# Patient Record
Sex: Male | Born: 1935 | Race: Black or African American | Hispanic: No | Marital: Married | Smoking: Never smoker
Health system: Southern US, Community
[De-identification: ages and names within clinical notes are randomized; demographics above are authoritative.]

## PROBLEM LIST (undated history)

## (undated) DIAGNOSIS — R51 Headache: Secondary | ICD-10-CM

## (undated) DIAGNOSIS — E785 Hyperlipidemia, unspecified: Secondary | ICD-10-CM

## (undated) DIAGNOSIS — K635 Polyp of colon: Secondary | ICD-10-CM

## (undated) DIAGNOSIS — I10 Essential (primary) hypertension: Secondary | ICD-10-CM

## (undated) DIAGNOSIS — I2510 Atherosclerotic heart disease of native coronary artery without angina pectoris: Secondary | ICD-10-CM

## (undated) DIAGNOSIS — R739 Hyperglycemia, unspecified: Secondary | ICD-10-CM

## (undated) DIAGNOSIS — E349 Endocrine disorder, unspecified: Secondary | ICD-10-CM

## (undated) DIAGNOSIS — M1990 Unspecified osteoarthritis, unspecified site: Secondary | ICD-10-CM

## (undated) DIAGNOSIS — N529 Male erectile dysfunction, unspecified: Secondary | ICD-10-CM

## (undated) DIAGNOSIS — I639 Cerebral infarction, unspecified: Secondary | ICD-10-CM

## (undated) DIAGNOSIS — I219 Acute myocardial infarction, unspecified: Secondary | ICD-10-CM

## (undated) DIAGNOSIS — E669 Obesity, unspecified: Secondary | ICD-10-CM

## (undated) DIAGNOSIS — R519 Headache, unspecified: Secondary | ICD-10-CM

## (undated) HISTORY — PX: CATARACT EXTRACTION, BILATERAL: SHX1313

## (undated) HISTORY — DX: Endocrine disorder, unspecified: E34.9

## (undated) HISTORY — DX: Obesity, unspecified: E66.9

## (undated) HISTORY — PX: SHOULDER SURGERY: SHX246

## (undated) HISTORY — DX: Essential (primary) hypertension: I10

## (undated) HISTORY — DX: Hyperglycemia, unspecified: R73.9

## (undated) HISTORY — PX: KNEE ARTHROSCOPY: SUR90

## (undated) HISTORY — DX: Hyperlipidemia, unspecified: E78.5

## (undated) HISTORY — DX: Polyp of colon: K63.5

## (undated) HISTORY — PX: TRANSURETHRAL RESECTION OF BLADDER: SUR1395

## (undated) HISTORY — DX: Atherosclerotic heart disease of native coronary artery without angina pectoris: I25.10

## (undated) HISTORY — DX: Male erectile dysfunction, unspecified: N52.9

## (undated) HISTORY — DX: Unspecified osteoarthritis, unspecified site: M19.90

---

## 1991-03-27 DIAGNOSIS — I2510 Atherosclerotic heart disease of native coronary artery without angina pectoris: Secondary | ICD-10-CM

## 1991-03-27 HISTORY — DX: Atherosclerotic heart disease of native coronary artery without angina pectoris: I25.10

## 2007-04-29 ENCOUNTER — Ambulatory Visit (HOSPITAL_COMMUNITY): Admission: RE | Admit: 2007-04-29 | Discharge: 2007-04-29 | Payer: Self-pay | Admitting: Family Medicine

## 2007-05-02 ENCOUNTER — Ambulatory Visit (HOSPITAL_COMMUNITY): Admission: RE | Admit: 2007-05-02 | Discharge: 2007-05-02 | Payer: Self-pay | Admitting: Family Medicine

## 2007-06-21 ENCOUNTER — Emergency Department (HOSPITAL_COMMUNITY): Admission: EM | Admit: 2007-06-21 | Discharge: 2007-06-21 | Payer: Self-pay | Admitting: Emergency Medicine

## 2007-07-10 ENCOUNTER — Ambulatory Visit (HOSPITAL_COMMUNITY): Admission: RE | Admit: 2007-07-10 | Discharge: 2007-07-10 | Payer: Self-pay | Admitting: Family Medicine

## 2008-06-11 ENCOUNTER — Ambulatory Visit (HOSPITAL_COMMUNITY): Admission: RE | Admit: 2008-06-11 | Discharge: 2008-06-11 | Payer: Self-pay | Admitting: Family Medicine

## 2008-10-21 ENCOUNTER — Ambulatory Visit (HOSPITAL_COMMUNITY): Admission: RE | Admit: 2008-10-21 | Discharge: 2008-10-21 | Payer: Self-pay | Admitting: Ophthalmology

## 2008-11-11 ENCOUNTER — Ambulatory Visit (HOSPITAL_COMMUNITY): Admission: RE | Admit: 2008-11-11 | Discharge: 2008-11-11 | Payer: Self-pay | Admitting: Ophthalmology

## 2008-12-26 ENCOUNTER — Emergency Department (HOSPITAL_COMMUNITY): Admission: EM | Admit: 2008-12-26 | Discharge: 2008-12-26 | Payer: Self-pay | Admitting: Emergency Medicine

## 2009-02-02 ENCOUNTER — Emergency Department (HOSPITAL_COMMUNITY): Admission: EM | Admit: 2009-02-02 | Discharge: 2009-02-02 | Payer: Self-pay | Admitting: Emergency Medicine

## 2009-03-09 ENCOUNTER — Encounter (INDEPENDENT_AMBULATORY_CARE_PROVIDER_SITE_OTHER): Payer: Self-pay | Admitting: *Deleted

## 2009-03-09 LAB — CONVERTED CEMR LAB
ALT: 21 units/L
AST: 21 units/L
Albumin: 5.2 g/dL
Alkaline Phosphatase: 63 units/L
BUN: 10 mg/dL
CO2: 25 meq/L
Calcium: 10.2 mg/dL
Chloride: 105 meq/L
Cholesterol: 138 mg/dL
Creatinine, Ser: 0.95 mg/dL
Free T4: 7.9 ng/dL
Glucose, Bld: 101 mg/dL
HDL: 45 mg/dL
LDL Cholesterol: 78 mg/dL
Potassium: 4.9 meq/L
Sodium: 142 meq/L
TSH: 1.108 microintl units/mL
Total Protein: 8.2 g/dL
Triglycerides: 76 mg/dL

## 2009-03-23 ENCOUNTER — Encounter (INDEPENDENT_AMBULATORY_CARE_PROVIDER_SITE_OTHER): Payer: Self-pay | Admitting: *Deleted

## 2009-03-23 LAB — CONVERTED CEMR LAB
ALT: 22 units/L
AST: 20 units/L
Albumin: 4.7 g/dL
Alkaline Phosphatase: 60 units/L
BUN: 17 mg/dL
CO2: 23 meq/L
Calcium: 9.1 mg/dL
Chloride: 104 meq/L
Cholesterol: 135 mg/dL
Creatinine, Ser: 1.01 mg/dL
Glucose, Bld: 93 mg/dL
HDL: 42 mg/dL
LDL Cholesterol: 74 mg/dL
Potassium: 4 meq/L
Sodium: 140 meq/L
TSH: 2.165 microintl units/mL
Total Protein: 7.5 g/dL
Triglycerides: 95 mg/dL

## 2009-03-24 ENCOUNTER — Encounter (INDEPENDENT_AMBULATORY_CARE_PROVIDER_SITE_OTHER): Payer: Self-pay | Admitting: *Deleted

## 2009-03-24 ENCOUNTER — Ambulatory Visit: Payer: Self-pay | Admitting: Cardiology

## 2009-03-24 DIAGNOSIS — I4949 Other premature depolarization: Secondary | ICD-10-CM

## 2009-03-24 DIAGNOSIS — I2510 Atherosclerotic heart disease of native coronary artery without angina pectoris: Secondary | ICD-10-CM | POA: Insufficient documentation

## 2009-03-24 DIAGNOSIS — M1990 Unspecified osteoarthritis, unspecified site: Secondary | ICD-10-CM | POA: Insufficient documentation

## 2009-03-24 DIAGNOSIS — I10 Essential (primary) hypertension: Secondary | ICD-10-CM | POA: Insufficient documentation

## 2009-03-24 DIAGNOSIS — R7309 Other abnormal glucose: Secondary | ICD-10-CM | POA: Insufficient documentation

## 2009-04-08 ENCOUNTER — Ambulatory Visit: Payer: Self-pay | Admitting: Cardiology

## 2009-04-08 ENCOUNTER — Ambulatory Visit (HOSPITAL_COMMUNITY): Admission: RE | Admit: 2009-04-08 | Discharge: 2009-04-08 | Payer: Self-pay | Admitting: Cardiology

## 2009-04-08 ENCOUNTER — Encounter: Payer: Self-pay | Admitting: Cardiology

## 2009-04-14 ENCOUNTER — Encounter (INDEPENDENT_AMBULATORY_CARE_PROVIDER_SITE_OTHER): Payer: Self-pay | Admitting: *Deleted

## 2009-05-23 ENCOUNTER — Ambulatory Visit: Payer: Self-pay | Admitting: Cardiology

## 2009-06-14 ENCOUNTER — Encounter (INDEPENDENT_AMBULATORY_CARE_PROVIDER_SITE_OTHER): Payer: Self-pay | Admitting: *Deleted

## 2009-06-14 LAB — CONVERTED CEMR LAB
ALT: 40 units/L
AST: 26 units/L
Albumin: 4.6 g/dL
Alkaline Phosphatase: 51 units/L
Bilirubin, Direct: 0.1 mg/dL
Cholesterol: 143 mg/dL
HDL: 49 mg/dL
LDL Cholesterol: 79 mg/dL
Total Protein: 7.3 g/dL
Triglycerides: 76 mg/dL

## 2009-06-24 ENCOUNTER — Telehealth (INDEPENDENT_AMBULATORY_CARE_PROVIDER_SITE_OTHER): Payer: Self-pay

## 2009-06-28 ENCOUNTER — Encounter: Payer: Self-pay | Admitting: Cardiology

## 2009-06-30 ENCOUNTER — Emergency Department (HOSPITAL_COMMUNITY): Admission: EM | Admit: 2009-06-30 | Discharge: 2009-06-30 | Payer: Self-pay | Admitting: Emergency Medicine

## 2009-07-18 ENCOUNTER — Ambulatory Visit: Payer: Self-pay | Admitting: Cardiology

## 2009-08-09 ENCOUNTER — Ambulatory Visit: Payer: Self-pay | Admitting: Cardiology

## 2010-01-09 ENCOUNTER — Encounter (INDEPENDENT_AMBULATORY_CARE_PROVIDER_SITE_OTHER): Payer: Self-pay | Admitting: *Deleted

## 2010-02-09 ENCOUNTER — Encounter (INDEPENDENT_AMBULATORY_CARE_PROVIDER_SITE_OTHER): Payer: Self-pay | Admitting: *Deleted

## 2010-02-10 ENCOUNTER — Ambulatory Visit: Payer: Self-pay | Admitting: Cardiology

## 2010-02-10 DIAGNOSIS — E785 Hyperlipidemia, unspecified: Secondary | ICD-10-CM

## 2010-03-06 ENCOUNTER — Telehealth (INDEPENDENT_AMBULATORY_CARE_PROVIDER_SITE_OTHER): Payer: Self-pay | Admitting: *Deleted

## 2010-03-08 ENCOUNTER — Ambulatory Visit: Payer: Self-pay | Admitting: Cardiovascular Disease

## 2010-03-13 ENCOUNTER — Telehealth (INDEPENDENT_AMBULATORY_CARE_PROVIDER_SITE_OTHER): Payer: Self-pay | Admitting: *Deleted

## 2010-03-13 ENCOUNTER — Ambulatory Visit: Payer: Self-pay | Admitting: Internal Medicine

## 2010-03-13 ENCOUNTER — Emergency Department (HOSPITAL_COMMUNITY)
Admission: EM | Admit: 2010-03-13 | Discharge: 2010-03-13 | Payer: Self-pay | Source: Home / Self Care | Admitting: Emergency Medicine

## 2010-03-13 DIAGNOSIS — K5900 Constipation, unspecified: Secondary | ICD-10-CM | POA: Insufficient documentation

## 2010-03-14 ENCOUNTER — Encounter: Payer: Self-pay | Admitting: Internal Medicine

## 2010-03-16 ENCOUNTER — Encounter: Payer: Self-pay | Admitting: Cardiology

## 2010-03-21 ENCOUNTER — Encounter: Payer: Self-pay | Admitting: Cardiology

## 2010-03-21 ENCOUNTER — Ambulatory Visit: Payer: Self-pay | Admitting: Cardiology

## 2010-03-28 ENCOUNTER — Telehealth (INDEPENDENT_AMBULATORY_CARE_PROVIDER_SITE_OTHER): Payer: Self-pay | Admitting: *Deleted

## 2010-04-11 ENCOUNTER — Encounter: Payer: Self-pay | Admitting: Gastroenterology

## 2010-04-12 ENCOUNTER — Ambulatory Visit (HOSPITAL_COMMUNITY)
Admission: RE | Admit: 2010-04-12 | Discharge: 2010-04-12 | Payer: Self-pay | Source: Home / Self Care | Attending: Internal Medicine | Admitting: Internal Medicine

## 2010-04-12 HISTORY — PX: COLONOSCOPY W/ POLYPECTOMY: SHX1380

## 2010-04-16 ENCOUNTER — Encounter: Payer: Self-pay | Admitting: Family Medicine

## 2010-04-21 ENCOUNTER — Encounter: Payer: Self-pay | Admitting: Internal Medicine

## 2010-04-25 ENCOUNTER — Encounter: Payer: Self-pay | Admitting: Cardiology

## 2010-04-25 NOTE — Op Note (Signed)
  NAME:  Glenn Brooks, Glenn Brooks               ACCOUNT NO.:  [id number]  MEDICAL RECORD NO.:  [id number]          PATIENT TYPE:  AMB  LOCATION:  DAY                           FACILITY:  [facility]  PHYSICIAN:  R. Roetta Sessions, M.D. DATE OF BIRTH:  11-30-1929  DATE OF PROCEDURE:  04/12/2010 DATE OF DISCHARGE:                              OPERATIVE REPORT   INDICATIONS FOR PROCEDURE:  A 77 year old gentleman with a history of colonic adenomatous polyps removed reportedly in [place], [place] 6 years ago.  He has no lower GI tract symptoms.  He is now sent over by Dr. Janna Arch for surveillance exam.  The colonoscopy is now being done. Risks, benefits, limitations, alternatives, and imponderables have been reviewed, questions answered.  Please see the documentation for the medical record.  PROCEDURE NOTE:  O2 saturation, blood pressure, pulse, and respirations were monitored throughout the entirety of the procedure.  CONSCIOUS SEDATION:  Versed 5 mg IV, Demerol 75 mg IV in divided doses.  INSTRUMENT:  Pentax video chip system.  FINDINGS:  Digital rectal exam revealed no abnormalities.  Endoscopic findings:  Prep was adequate.  Colon:  Colonic mucosa was surveyed from the rectosigmoid junction through the left transverse, right colon to the appendiceal orifice, ileocecal valve/cecum.  These structures were well seen and photographed for the record.  From this level, the scope was slowly and cautiously withdrawn.  All previously mentioned mucosal surfaces were again seen.  The patient had a 5-mm polyp at the base of the cecum which was cold snared, recovered through the scope.  At the hepatic flexure, there was a 1-cm carpet polyp, spanning 2 folds. Please see photos.  This was removed with 2 passes of hot snare and the base of polyp tissue was submitted to Pathology.  It is possible slight remnant of polyp left on the polyp which was ablated with the tip of hot snare cautery unit.  The patient  was also noted to have pancolonic diverticula, however, the remainder of the colonic mucosa appeared unremarkable.  Scope was pulled down in to the rectum where thorough examination of the rectal mucosa including retroflex view of the anal verge demonstrated no abnormalities.  The patient tolerated the procedure well.  Cecal withdrawal time 22 minutes.  IMPRESSION: 1. Normal rectum. 2. Pancolonic diverticula. 3. Polyp at hepatic flexure and cecum, resected with snare technique     as described above.  RECOMMENDATIONS: 1. Diverticulosis, polyp, and constipation literature provided to Ms.     Brouse. 2. Follow up on path. 3. For his chronic constipation, advance MiraLax 17 g orally once     daily to twice daily nightly p.r.n. no bowel movement on any given     day.     Jonathon Bellows, M.D.     RMR/MEDQ  D:  04/12/2010  T:  04/12/2010  Job:  [id number]  cc:   Melvyn Novas, MD Fax: [phone]  Electronically Signed by Lorrin Goodell M.D. on 04/25/2010 01:38:02 PM

## 2010-04-25 NOTE — Letter (Signed)
Summary: BP READINGS  BP READINGS   Imported By: Faythe Ghee 06/28/2009 14:43:51  _____________________________________________________________________  External Attachment:    Type:   Image     Comment:   External Document

## 2010-04-25 NOTE — Assessment & Plan Note (Signed)
Summary: 2 mth nurse visit per checkout on 05/23/09/tg  Nurse Visit   Vital Signs:  Patient profile:   77 year old male Height:      67 inches Weight:      188 pounds O2 Sat:      98 % on Room air Pulse rate:   56 / minute BP sitting:   147 / 85  (left arm)  Vitals Entered By: Teressa Lower RN (July 18, 2009 8:54 AM)  O2 Flow:  Room air  Visit Type:  2 month nurse visit Primary Provider:  Dr. Katharine Look   History of Present Illness: S:shortness of breath and chest pain  that radiates to left arm with exertion. Stop as soon as he stops. If the air  is too hot or cold it takes his breath also. B:  pt was seen in Feb, medication list pt brought to office did not match his office note, pt went home and brought medications to office to verify his current med list A:: this is the pt that Larita Fife tried to get and come back to the office and retrieve the bp cuff and continue his bp checks and he would not return our calls, bp diary is for 1 month only average hr:   61 average sbp:  109, removed 7 questionable sbp readings and recalc   116 average dbp:  77, recalc as above   76 diary scanned R:pt is not compliant with his bp regimen, did not bring any further readings after he was asked to continue to monitor his pressure last month   Impression & Recommendations:  Problem # 1:  HYPERTENSION (ICD-401.9) Blood pressure control appears to be good by home monitoring.  Since patient does not desire further close followup, we will continue to manage based upon office blood pressures.  Problem # 2:  ATHEROSCLEROTIC CARDIOVASCULAR DISEASE (ICD-429.2) Patient was asymptomatic at office visit 2 months ago.  He is now complaining of symptoms compatible with exertional angina.  He requires reevaluation by Ms. Lawrence or by me within the next week or 2.  Start nitroglycerin 0.4 mg SL p.r.n. if he does not have, and asked him to use them before exertion or when he developed symptoms.   Current  Medications (verified): 1)  Hydrocodone-Acetaminophen 7.5-650 Mg Tabs (Hydrocodone-Acetaminophen) .... Take 1 Tablet By Mouth Every 4 Hrs As Needed For Pain 2)  Diltiazem Hcl Er Beads 240 Mg Xr24h-Cap (Diltiazem Hcl Er Beads) .... Take 1 Tablet By Mouth Once A Day 3)  Simvastatin 40 Mg Tabs (Simvastatin) .... Take 1 Tab Daily 4)  Bayer Aspirin 325 Mg Tabs (Aspirin) .... Take 1 Tab Daily 5)  Valium 5 Mg Tabs (Diazepam) .... Take As Needed 6)  Lisinopril-Hydrochlorothiazide 20-12.5 Mg Tabs (Lisinopril-Hydrochlorothiazide) .... Take 2 Tablets By Mouth Daily 7)  Cialis 10 Mg Tabs (Tadalafil) .... As Needed Sexual Activity 8)  Diltiazem Hcl Coated Beads 180 Mg Xr24h-Cap (Diltiazem Hcl Coated Beads) .... Take 1 Tablet By Mouth Once A Day 9)  Tamsulosin Hcl 0.4 Mg Caps (Tamsulosin Hcl) .... Take 1 Tablet By Mouth Once A Day  Allergies (verified): No Known Drug Allergies  Prescriptions: NITROSTAT 0.4 MG SUBL (NITROGLYCERIN) 1 tablet under tongue at onset of chest pain; you may repeat every 5 minutes for up to 3 doses.  #25 x 3   Entered by:   Teressa Lower RN   Authorized by:   Kathlen Brunswick, MD, [facility]   Signed by:   Teressa Lower RN  on 07/21/2009   Method used:   Electronically to        [facility]. [phone]* (retail)       [address] [place]  [address]       Ph: [phone]       Fax: [phone]   RxID:   [id number]  pt notified of new rx for ntg, how to use and follow up appt on 08/04/09 11:00 am with NP  Teressa Lower RN  July 21, 2009 11:30 AM

## 2010-04-25 NOTE — Assessment & Plan Note (Signed)
Summary: 2 mth f/u per checkout on 03/24/09/tg   Visit Type:  Follow-up Primary Provider:  Dr. Katharine Look  CC:  no complaints today.  History of Present Illness: Return visit for this pleasant 77 year old gentleman with known coronary artery disease and recent palpitations.  Since his last visit, he has felt fine.  He is active including walking and bicycle riding without any cardiopulmonary symptoms.  He has been troubled by right knee pain.  A recent injection failed to provide relief.  He has not developed any new medical problems.  His has monitored blood pressure with systolics ranging from 130 to 170 and diastolics from 50-100.  Heart rate has been low at times.   Current Medications (verified): 1)  Hydrocodone-Acetaminophen 7.5-650 Mg Tabs (Hydrocodone-Acetaminophen) .... Take As Directed 2)  Ibuprofen 800 Mg Tabs (Ibuprofen) .... Take As Needed For Arthritis 3)  Diltiazem Hcl Er Beads 240 Mg Xr24h-Cap (Diltiazem Hcl Er Beads) .... Take 1 Tablet By Mouth Once A Day 4)  Simvastatin 40 Mg Tabs (Simvastatin) .... Take 1 Tab Daily 5)  Bayer Aspirin 325 Mg Tabs (Aspirin) .... Take 1 Tab Daily 6)  Valium 5 Mg Tabs (Diazepam) .... Take As Needed 7)  Metoprolol Tartrate 50 Mg Tabs (Metoprolol Tartrate) .... Take One Half  Tablet By Mouth Twice A Day 8)  Lisinopril-Hydrochlorothiazide 20-12.5 Mg Tabs (Lisinopril-Hydrochlorothiazide) .... Take 2 Tablets By Mouth Daily 9)  Cialis 10 Mg Tabs (Tadalafil)  Allergies (verified): No Known Drug Allergies  Past History:  PMH, FH, and Social History reviewed and updated.  Review of Systems  The patient denies weight loss, weight gain, chest pain, syncope, dyspnea on exertion, peripheral edema, prolonged cough, headaches, hemoptysis, abdominal pain, and melena.    Vital Signs:  Patient profile:   77 year old male Weight:      183 pounds Pulse rate:   77 / minute BP sitting:   115 / 63  (right arm)  Vitals Entered By: Dreama Saa,  CNA (May 23, 2009 1:24 PM)  Physical Exam  General:    Somewhat overweight well-developed; no acute distress: HEENT-[place]/AT; PERRL; EOM intact; conjunctiva and lids nl; bilateral arcus Neck-No JVD; no carotid bruits Lungs-No tachypnea, clear without rales, rhonchi or wheezes: CV-normal PMI; normal S1 and S2; minimal early systolic ejection murmur Abdomen-BS normal; soft and non-tender without masses or organomegaly: MS-No deformities, cyanosis or clubbing: Neurologic-Nl cranial nerves; symmetric strength and tone: Skin- Warm, no sig. lesions: Extremities-decreased left dorsalis pedis pulse; no edema    Impression & Recommendations:  Problem # 1:  ATHEROSCLEROTIC CARDIOVASCULAR DISEASE (ICD-429.2) His stress echocardiogram shows an area of scarring in the distribution of the right coronary artery without evidence for ischemia.  Overall LV systolic function is normal.  These results suggest a good prognosis and low risk for a cardiovascular event in the near or mid- term.  Since he has had some bradycardia, albeit asymptomatic, his dose of metoprolol will not be increased.  Problem # 2:  PREMATURE VENTRICULAR CONTRACTIONS (ICD-427.69) No arrhythmias noted at this visit.  Patient is not experiencing any symptoms at present.  No further testing warranted at the present time.  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  Problem # 4:  HYPERLIPIDEMIA, MIXED (ICD-272.2) Lipid profile was excellent in late 2010 with total cholesterol 135 and LDL 74.  Current medications will be continued.  Problem # 5:  HYPERTENSION (ICD-401.9) Blood pressure control is slightly suboptimal.  Mr. Fabiano dose of hydrochlorothiazide/lisinopril will be doubled.  Diltiazem will be increased to 240 mg q.d.  He will followup with the cardiovascular nurses in 2 months, while continuing to monitor blood pressure at home.  I will  see this nice gentleman again in one year.  Patient Instructions: 1)  Your physician recommends that you schedule a follow-up appointment in: 1 year 2)  Your physician has recommended you make the following change in your medication: increase lisinopril/hctz ti 2 tablets daily, increase diltiazem to 240mg  daily 3)  You have been referred to nurse visit in 2 months 4)  Your physician has requested that you regularly monitor and record your blood pressure readings at home.  Please use the same machine at the same time of day to check your readings and record them to bring to your follow-up visit. Prescriptions: CIALIS 10 MG TABS (TADALAFIL)   #3 x 0   Entered by:   Teressa Lower RN   Authorized by:   Kathlen Brunswick, MD, [facility]   Signed by:   Teressa Lower RN on 05/23/2009   Method used:   Print then Give to Patient   RxID:   [id number] DILTIAZEM HCL ER BEADS 240 MG XR24H-CAP (DILTIAZEM HCL ER BEADS) Take 1 tablet by mouth once a day  #30 x 3   Entered by:   Teressa Lower RN   Authorized by:   Kathlen Brunswick, MD, [facility]   Signed by:   Teressa Lower RN on 05/23/2009   Method used:   Electronically to        [facility]. [phone]* (retail)       [address] [place], [place]  [address]       Ph: [phone]       Fax: [phone]   RxID:   [id number] LISINOPRIL-HYDROCHLOROTHIAZIDE 20-12.5 MG TABS (LISINOPRIL-HYDROCHLOROTHIAZIDE) take 2 tablets by mouth daily  #60 x 3   Entered by:   Teressa Lower RN   Authorized by:   Kathlen Brunswick, MD, [facility]   Signed by:   Teressa Lower RN on 05/23/2009   Method used:   Electronically to        [facility]. [phone]* (retail)       [address], [place]  [address]       Ph: [phone]       Fax: [phone]   RxID:   [id number]

## 2010-04-25 NOTE — Assessment & Plan Note (Signed)
Summary: 6 mth f/u per checkout on 08/09/09/tg   Visit Type:  Follow-up Primary Shanan Mcmiller:  Dr.Richard Dondiego   History of Present Illness: [name] returns to the office as scheduled for continued assessment and treatment of coronary disease and cardiovascular risk factors, most notably hypertension.  Since his last visit, chest discomfort has resolved.  Lifestyle is relatively sedentary, but he is able to walk at a leisurely pace without difficulty.  He denies orthopnea, PND, ankle edema, lightheadedness and syncope.  He does not follow his blood pressure, but believes results were normal at his most recent visit with Dr. Janna Arch.  Records were never sent from the [facility].  Exactly which medications he is taking remains in some doubt.  We contacted his pharmacy, whose records do not include many of the medications that he has reported to [place].  He returned home brought [place] his current prescription vials for which his current medication list was constructed.   Current Medications (verified): 1)  Simvastatin 40 Mg Tabs (Simvastatin) .... Take 1 Tab Daily 2)  Bayer Aspirin 325 Mg Tabs (Aspirin) .... Take 1 Tab Daily 3)  Valium 5 Mg Tabs (Diazepam) .... Take As Needed 4)  Lisinopril-Hydrochlorothiazide 20-12.5 Mg Tabs (Lisinopril-Hydrochlorothiazide) .... Take 2 Tablets By Mouth Daily 5)  Terazosin Hcl 5 Mg Caps (Terazosin Hcl) .... Take 1 Tablet By Mouth Once A Day 6)  Nitrostat 0.4 Mg Subl (Nitroglycerin) .Marland Kitchen.. 1 Tablet Under Tongue At Onset of Chest Pain; You May Repeat Every 5 Minutes For Up To 3 Doses. 7)  Cartia Xt 180 Mg Xr24h-Cap (Diltiazem Hcl Coated Beads) .... Take 1 Tab Daily 8)  Fluoxetine Hcl 20 Mg Caps (Fluoxetine Hcl) .... Take 1 Tab Daily 9)  Metoprolol Succinate 50 Mg Xr24h-Tab (Metoprolol Succinate) .... Take One Half  Tablet By Mouth Daily  Allergies (verified): No Known Drug Allergies  Comments:  Nurse/Medical Assistant: patient didn't bring meds or list  stated that meds are correct from previous ov   Past History:  PMH, FH, and Social History reviewed and updated.  Review of Systems       See history of present illness.  Vital Signs:  Patient profile:   77 year old male Weight:      184 pounds BMI:     28.92 O2 Sat:      98 % on Room air Pulse rate:   79 / minute BP sitting:   164 / 81  (right arm)  Vitals Entered By: Dreama Saa, CNA (February 10, 2010 1:07 PM)  O2 Flow:  Room air  Physical Exam  General:  Somewhat overweight; well developed; no acute distress:   Neck-No JVD; no carotid bruits: Lungs-No tachypnea, no rales; no rhonchi; no wheezes: Cardiovascular-normal PMI; normal S1 and S2; S4 present Abdomen-BS normal; soft and non-tender without masses or organomegaly:  Musculoskeletal-No deformities, no cyanosis or clubbing: Neurologic-Normal cranial nerves; symmetric strength and tone:  Skin-Warm, no significant lesions: Extremities-1-2+ distal pulses; no edema:     Impression & Recommendations:  Problem # 1:  ATHEROSCLEROTIC CARDIOVASCULAR DISEASE (ICD-429.2) Patient is stable with respect to coronary disease with no symptoms at present to suggest myocardial ischemia and a negative stress echocardiogram within the past year.  His prognosis appears to be good, and current therapy will be continued.  Problem # 2:  HYPERLIPIDEMIA, MIXED (ICD-272.2) Lipid profile was excellent earlier this year and need not be repeated until next year.  His updated medication list for this problem includes:  Simvastatin 40 Mg Tabs (Simvastatin) .Marland Kitchen... Take 1 tab daily  CHOL: 143 (06/14/2009)   LDL: 79 (06/14/2009)   HDL: 49 (06/14/2009)   TG: 76 (06/14/2009)  Problem # 3:  HYPERTENSION (ICD-401.9) Blood pressure control has not been adequate at any previous visit to this office.  Patient has returned home to gather his medications and review them with [place].  He appears to have lost his prescription for metoprolol, which will  be resumed.  Mr. Packett will collect additional blood pressure measurements outside the office if feasible and return in one month for blood pressure assessment by the cardiology nurses.  Patient Instructions: 1)  Your physician recommends that you schedule a follow-up appointment in: 3 months 2)  Your physician has recommended you make the following change in your medication: stop tamsulosin and begin terazosin 5mg  daily, metoprolol succinate 50mg    1/2 tablet daily 3)  You have been referred to nurse visit in 1 month, please bring bp diary to nurse visit 4)  Your physician has requested that you regularly monitor and record your blood pressure readings at home.  Please use the same machine at the same time of day to check your readings and record them to bring to your follow-up visit. Prescriptions: METOPROLOL SUCCINATE 50 MG XR24H-TAB (METOPROLOL SUCCINATE) Take one half  tablet by mouth daily  #15 x 3   Entered by:   Teressa Lower RN   Authorized by:   Kathlen Brunswick, MD, [facility]   Signed by:   Teressa Lower RN on 02/10/2010   Method used:   Electronically to        [facility]. [phone]* (retail)       [address] [place], [place]  [address]       Ph: [phone]       Fax: [phone]   RxID:   [id number] TERAZOSIN HCL 5 MG CAPS (TERAZOSIN HCL) Take 1 tablet by mouth once a day  #30 x 3   Entered by:   Teressa Lower RN   Authorized by:   Kathlen Brunswick, MD, [facility]   Signed by:   Teressa Lower RN on 02/10/2010   Method used:   Electronically to        [facility]. [phone]* (retail)       [address] [place]  [address]       Ph: [phone]       Fax: [phone]   RxID:   [id number]

## 2010-04-25 NOTE — Letter (Signed)
Summary: [place] Results Engineer, agricultural at [facility]  [address] [place] [address]   Phone: [phone]  Fax: [phone]      April 14, 2009 MRN: [id number]   [facility] [address] [place], [place]  [address]   Dear Mr. Mathurin,  Your test ordered by Selena Batten has been reviewed by your physician (or physician assistant) and was found to be normal or stable. Your physician (or physician assistant) felt no changes were needed at this time.  _x___ Echocardiogram  ____ Cardiac Stress Test  ____ Lab Work  ____ Peripheral vascular study of arms, legs or neck  ____ CT scan or X-ray  ____ Lung or Breathing test  ____ Other:  No change in medical treatment at this time, per Dr. Dietrich Pates.  Thank you, Jigar Zielke Allyne Gee RN    Byron Bing, MD, [facility].C.Gaylord Shih, MD, F.A.C.C Lewayne Bunting, MD, F.A.C.C Nona Dell, MD, F.A.C.C Charlton Haws, MD, [facility].C.C

## 2010-04-25 NOTE — Miscellaneous (Signed)
Summary: LABS LIPIDS,LIVER,06/14/2009  Clinical Lists Changes  Observations: Added new observation of ALBUMIN: 4.6 g/dL (16/12/9602 54:09) Added new observation of PROTEIN, TOT: 7.3 g/dL (81/19/1478 29:56) Added new observation of SGPT (ALT): 40 units/L (06/14/2009 11:35) Added new observation of SGOT (AST): 26 units/L (06/14/2009 11:35) Added new observation of ALK PHOS: 51 units/L (06/14/2009 11:35) Added new observation of BILI DIRECT: 0.1 mg/dL (21/30/8657 84:69) Added new observation of LDL: 79 mg/dL (62/95/2841 32:44) Added new observation of HDL: 49 mg/dL (03/28/7251 66:44) Added new observation of TRIGLYC TOT: 76 mg/dL (03/47/4259 56:38) Added new observation of CHOLESTEROL: 143 mg/dL (75/64/3329 51:88)  Appended Document: LABS LIPIDS,LIVER,06/14/2009    Clinical Lists Changes  Observations: Added new observation of CARDIO HPI: Addition of a lipid profile performed dated 05/2009 by Dr. Janna Arch. (02/15/2010 17:40) Added new observation of PRIMARY MD: Dr.Richard Dondiego (02/15/2010 17:40) Added new observation of TRIGLYCERIDE: 76 mg/dL (41/66/0630 16:01) Added new observation of LDL (CALCUL): 79 mg/dL (09/32/3557 32:20)       Primary Glenn Brooks:  Dr.Richard Dondiego   History of Present Illness: Addition of a lipid profile performed dated 05/2009 by Dr. Janna Arch.   -  Date:  06/14/2009    LDL-calculated: 79    Triglycerides: 76

## 2010-04-25 NOTE — Progress Notes (Signed)
Summary: BP cuff  Phone Note Outgoing Call   Call placed by: Larita Fife Via LPN,  June 24, 2009 11:52 AM Summary of Call: Second call. Pt. brought BP cuff back to office. Pt. was to keep cuff and record readings daily until nurse visit on 07-18-09. Left message for him to come by office to get BP cuff and to continue BP monitoring.

## 2010-04-25 NOTE — Letter (Signed)
Summary: Unable to Reach, Consult Scheduled  [facility]  [address]   [place], [place] [address]   Phone: [phone]  Fax: [phone]    01/09/2010  Glenn Brooks [address] [place], [place]  [address] 1930/02/02   Dear [name],   We have been unable to reach you by phone.  Please contact our office with an updated phone number.  At the recommendation of DR DONDIEGO  we have been asked to schedule you a consult with DR Jena Gauss for CONSULT FOR COLONOSCOPY.  Please call our office at [phone].     Thank you,    Diana Eves  [facility] R. Roetta Sessions, M.D.    Jonette Eva, M.D. Lorenza Burton, FNP-BC    Tana Coast, PA-C Phone: [phone]    Fax: [phone]

## 2010-04-25 NOTE — Assessment & Plan Note (Signed)
Summary: 1-2 wk f/u per [name]   Visit Type:  Follow-up Primary Provider:  Dr. Katharine Look  CC:  no cardiology complaints today.  History of Present Illness: Glenn Brooks is herer for follow-up after medication change for history of CAD, hypertension.  When last seen his diltiazem was increased from 180mg  to 240mg .  He denies new symptoms but continues to have exertional angina, rest relieves.  He complains of a sore throat and sinus pressure today.  He continues to be active riding a bike and walking daily.  Current Medications (verified): 1)  Hydrocodone-Acetaminophen 7.5-650 Mg Tabs (Hydrocodone-Acetaminophen) .... Take 1 Tablet By Mouth Every 4 Hrs As Needed For Pain 2)  Diltiazem Hcl Er Beads 240 Mg Xr24h-Cap (Diltiazem Hcl Er Beads) .... Take 1 Tablet By Mouth Once A Day 3)  Simvastatin 40 Mg Tabs (Simvastatin) .... Take 1 Tab Daily 4)  Bayer Aspirin 325 Mg Tabs (Aspirin) .... Take 1 Tab Daily 5)  Valium 5 Mg Tabs (Diazepam) .... Take As Needed 6)  Lisinopril-Hydrochlorothiazide 20-12.5 Mg Tabs (Lisinopril-Hydrochlorothiazide) .... Take 2 Tablets By Mouth Daily 7)  Cialis 10 Mg Tabs (Tadalafil) .... As Needed Sexual Activity 8)  Tamsulosin Hcl 0.4 Mg Caps (Tamsulosin Hcl) .... Take 1 Tablet By Mouth Once A Day 9)  Nitrostat 0.4 Mg Subl (Nitroglycerin) .Marland Kitchen.. 1 Tablet Under Tongue At Onset of Chest Pain; You May Repeat Every 5 Minutes For Up To 3 Doses.  Allergies (verified): No Known Drug Allergies  Vital Signs:  Patient profile:   77 year old male Weight:      191 pounds Pulse rate:   97 / minute BP sitting:   138 / 80  (right arm)  Vitals Entered By: Dreama Saa, CNA (Aug 09, 2009 2:54 PM)  Physical Exam  General:  Well developed, well nourished, in no acute distress. Lungs:  Clear bilaterally to auscultation and percussion. Heart:  Non-displaced PMI, chest non-tender; regular rate and rhythm, S1, S2 without murmurs, rubs or gallops. Carotid upstroke normal, no  bruit. Normal abdominal aortic size, no bruits. Femorals normal pulses, no bruits. Pedals normal pulses. No edema, no varicosities. Abdomen:  Bowel sounds positive; abdomen soft and non-tender without masses, organomegaly, or hernias noted. No hepatosplenomegaly. Pulses:  pulses normal in all 4 extremities Extremities:  No clubbing or cyanosis. Psych:  anxious and agitated.     Impression & Recommendations:  Problem # 1:  ATHEROSCLEROTIC CARDIOVASCULAR DISEASE (ICD-429.2) He continues to have chest discomfort with exertion but no severe discomfort.  He says he is used to it and does allow this to stop him. He had a recent stress echo which showed an area of scarring in the distribution of the RVA without evidence of ischmia.  LV fx was normal. He unfortunately has not taken his antihypertensives for a few days, but has called his pharmacy to have them refilled.  Problem # 2:  HYPERTENSION (ICD-401.9) Unable to assess whether increased dose of diltiazem has helped him or kept his BP controlled  He is aggitated about having to see provider, and admits to not taking his medications today.  We have called his pharmacy to make sure he has refills of his cardiac medications.  He is advised to take them daily.   The following medications were removed from the medication list:    Diltiazem Hcl Coated Beads 180 Mg Xr24h-cap (Diltiazem hcl coated beads) .Marland Kitchen... Take 1 tablet by mouth once a day His updated medication list for this problem includes:  Diltiazem Hcl Er Beads 240 Mg Xr24h-cap (Diltiazem hcl er beads) .Marland Kitchen... Take 1 tablet by mouth once a day    Bayer Aspirin 325 Mg Tabs (Aspirin) .Marland Kitchen... Take 1 tab daily    Lisinopril-hydrochlorothiazide 20-12.5 Mg Tabs (Lisinopril-hydrochlorothiazide) .Marland Kitchen... Take 2 tablets by mouth daily  Patient Instructions: 1)  Your physician recommends that you schedule a follow-up appointment in: 6 months 2)  Your physician recommends that you continue on your current  medications as directed. Please refer to the Current Medication list given to you today. Prescriptions: NITROSTAT 0.4 MG SUBL (NITROGLYCERIN) 1 tablet under tongue at onset of chest pain; you may repeat every 5 minutes for up to 3 doses.  #25 x 3   Entered by:   Larita Fife Via LPN   Authorized by:   Joni Reining, NP   Signed by:   Larita Fife Via LPN on 16/12/9602   Method used:   Electronically to        [facility] [address] [phone]* (retail)       [address] [place]  [address]       Ph: [phone]       Fax: [phone]   RxID:   [id number] LISINOPRIL-HYDROCHLOROTHIAZIDE 20-12.5 MG TABS (LISINOPRIL-HYDROCHLOROTHIAZIDE) take 2 tablets by mouth daily  #60 x 5   Entered by:   Larita Fife Via LPN   Authorized by:   Joni Reining, NP   Signed by:   Larita Fife Via LPN on 53/66/4403   Method used:   Electronically to        [facility] [address] [phone]* (retail)       [address] [place], [place]  [address]       Ph: [phone]       Fax: [phone]   RxID:   [id number] SIMVASTATIN 40 MG TABS (SIMVASTATIN) take 1 tab daily  #30 x 5   Entered by:   Larita Fife Via LPN   Authorized by:   Joni Reining, NP   Signed by:   Larita Fife Via LPN on 25/42/7062   Method used:   Electronically to        [facility] [address] [phone]* (retail)       [address], [place]  [address]       Ph: [phone]       Fax: [phone]   RxID:   [id number] DILTIAZEM HCL ER BEADS 240 MG XR24H-CAP (DILTIAZEM HCL ER BEADS) Take 1 tablet by mouth once a day  #30 x 5   Entered by:   Larita Fife Via LPN   Authorized by:   Joni Reining, NP   Signed by:   Larita Fife Via LPN on 69/67/8938   Method used:   Electronically to        [facility] [address] [phone]* (retail)       [address] [place]  [address]       Ph: [phone]       Fax: [phone]   RxID:   [id number]

## 2010-04-27 NOTE — Assessment & Plan Note (Signed)
Summary: NURSE VISIT  Nurse Visit   Vital Signs:  Patient profile:   77 year old male Height:      67 inches Weight:      186 pounds O2 Sat:      97 % on Room air Temp:     97.0 degrees F oral Pulse rate:   103 / minute BP sitting:   141 / 80  (left arm)  Vitals Entered By: Teressa Lower RN (March 08, 2010 9:33 AM)  O2 Flow:  Room air  Current Medications (verified): 1)  Simvastatin 40 Mg Tabs (Simvastatin) .... Take 1 Tab Daily 2)  Bayer Aspirin 325 Mg Tabs (Aspirin) .... Take 1 Tab Daily 3)  Valium 5 Mg Tabs (Diazepam) .... Take As Needed 4)  Lisinopril-Hydrochlorothiazide 20-12.5 Mg Tabs (Lisinopril-Hydrochlorothiazide) .... Take 2 Tablets By Mouth Daily 5)  Terazosin Hcl 5 Mg Caps (Terazosin Hcl) .... Take 1 Tablet By Mouth Once A Day 6)  Nitrostat 0.4 Mg Subl (Nitroglycerin) .Marland Kitchen.. 1 Tablet Under Tongue At Onset of Chest Pain; You May Repeat Every 5 Minutes For Up To 3 Doses. 7)  Cartia Xt 180 Mg Xr24h-Cap (Diltiazem Hcl Coated Beads) .... Take 1 Tab Daily 8)  Fluoxetine Hcl 20 Mg Caps (Fluoxetine Hcl) .... Take 1 Tab Daily 9)  Metoprolol Succinate 50 Mg Xr24h-Tab (Metoprolol Succinate) .... Take One Half  Tablet By Mouth Daily  Allergies (verified): No Known Drug Allergies  Visit Type:  nurse visit Primary Provider:  Dr.Richard Dondiego   History of Present Illness: S: Pt called about his new medicaitons making him feel " out of it" B: ov 02/10/10 pt was to stop tamsulosin and begin terazosin and metoprolol A:  pt stopped his prozac instead and kept taking flomax and terazosin R: corrected medication, threw away flomax and called in prozac for pt, he will continue his bp monitoring for another month  Good plan!  Joni Reining NP [facility] for pt to schedule a nurse visit

## 2010-04-27 NOTE — Letter (Signed)
SummaryPattricia Boss [facility] RECORDS  [facility] RECORDS   Imported By: Faythe Ghee 03/16/2010 09:57:58  _____________________________________________________________________  External Attachment:    Type:   Image     Comment:   External Document

## 2010-04-27 NOTE — Progress Notes (Signed)
Summary: BP readings are low  Phone Note Call from Patient   Caller: Patient Reason for Call: Talk to Nurse Summary of Call: patient states that his BP has started running low when he checks it on his machine / would like to speak to nurse / tg Initial call taken by: Raechel Ache [facility],  March 13, 2010 3:51 PM  Follow-up for Phone Call        lmom lmom   Teressa Lower RN  March 14, 2010 8:35 AM lom   Teressa Lower RN  March 14, 2010 2:33 PM   Follow-up by: Teressa Lower RN,  March 13, 2010 4:36 PM  Additional Follow-up for Phone Call Additional follow up Details #1::        pt was seen in the ED, report printed and in your box, nurse visit scheduled for next week, to check pt's bp  Additional Follow-up by: Teressa Lower RN,  March 15, 2010 10:56 AM    Additional Follow-up for Phone Call Additional follow up Details #2::    nurse visit on 03/21/2010 Follow-up by: Teressa Lower RN,  March 16, 2010 11:37 AM

## 2010-04-27 NOTE — Letter (Signed)
Summary: OP REPORT / PATH REPORT  OP REPORT / PATH REPORT   Imported By: Rexene Alberts 04/11/2010 16:14:10  _____________________________________________________________________  External Attachment:    Type:   Image     Comment:   External Document

## 2010-04-27 NOTE — Progress Notes (Signed)
Summary: rx is making him feel out of it  Phone Note Call from Patient Call back at Home Phone [phone]   Caller: pt walk in Reason for Call: Talk to Nurse Summary of Call: pt is just started taking metoprolol 50mg  1/2 tab once a day. he states that he can not take it because it causes him to be "out of it" along with his other medications Initial call taken by: Faythe Ghee,  March 06, 2010 9:51 AM  Follow-up for Phone Call        started med 1 week ago lightheaded dizzy headaches 88/55, 66 134/80,77   Follow-up by: Teressa Lower RN,  March 06, 2010 5:35 PM  Additional Follow-up for Phone Call Additional follow up Details #1::        scheduled nurse visit for 9:00am Wednesday Additional Follow-up by: Teressa Lower RN,  March 06, 2010 5:35 PM

## 2010-04-27 NOTE — Assessment & Plan Note (Signed)
Summary: nurse visit per Tammy on 03/15/10/tg  Nurse Visit   Vital Signs:  Patient profile:   77 year old male Height:      67 inches Weight:      188 pounds O2 Sat:      99 % on Room air Pulse (ortho):   71 / minute BP standing:   147 / 79  Vitals Entered By: Teressa Lower RN (March 21, 2010 9:30 AM)  O2 Flow:  Room air  Visit Type:  nurse visit per Friday phone call Referring Jakyron Fabro:  DonDiego Primary Dallen Bunte:  Dr.Richard Dondiego   History of Present Illness: S: pt called Friday after being seen in ED B pt seen in ED on 03/13/2010, pt told to hold terazosin and cartia for 1 day  A: pt held meds until visit today, he had flomax again in his possession even though I physcially threw away his last bottle at the nurse visit on 03/08/2010.  I believe he is taking it although he states he is not. bp diary returned and scanned into record. R:  03/24/10  D/C Flomax Hold  terazosin whenever dizziness occurs it is accompanied by low blood pressure. If terazosin already taken, consume 1 cup of soup or bullion. Call office for additional problems with blood pressure-did not go to the emergency department.  Nondalton Bing, M.D.  trying to call pt, lmom Teressa Lower RN  March 24, 2010 9:54 AM left message with grandaughter for pt to call our office for med changes  Teressa Lower RN  March 28, 2010 11:18 AM Pt came to office, printed new updated med list, have instructions about boullion and hypotenison. Teressa Lower RN  March 28, 2010 12:07 PM    Serial Vital Signs/Assessments:  Time      Position  BP       Pulse  Resp  Temp     By 9:30 AM   Lying LA  136/76   68                    Tammy Sanders RN 9:30 AM   Sitting   146/86   73                    Tammy Sanders RN 9:30 AM   Standing  147/79   71                    Tammy Sanders RN  Comments: 9:30 AM no symptoms during ortho vitals By: Teressa Lower RN    Current Medications (verified): 1)  Valium 5 Mg  Tabs (Diazepam) .... Take As Needed 2)  Terazosin Hcl 5 Mg Caps (Terazosin Hcl) .... Take 1 Tablet By Mouth Once A Day 3)  Nitrostat 0.4 Mg Subl (Nitroglycerin) .Marland Kitchen.. 1 Tablet Under Tongue At Onset of Chest Pain; You May Repeat Every 5 Minutes For Up To 3 Doses. 4)  Cartia Xt 180 Mg Xr24h-Cap (Diltiazem Hcl Coated Beads) .... Take 1 Tab Daily(On Hold) 5)  Fluoxetine Hcl 20 Mg Caps (Fluoxetine Hcl) .... Take 1 Tab Daily 6)  Metoprolol Succinate 50 Mg Xr24h-Tab (Metoprolol Succinate) .... Take One Half  Tablet By Mouth Daily 7)  Aspir-Low 81 Mg Tbec (Aspirin) .... Take 1 Tablet By Mouth Once A Day 8)  Multivitamin .... Take 1 Tablet By Mouth Once A Day 9)  Colace 100 Mg Caps (Docusate Sodium) .... Take 1 By Mouth Two Times A Day 10)  Miralax  Powd (Polyethylene Glycol 3350) .... Take 1 Capful Daily As Needed For Constipation. Hold If Diarrhea  Allergies (verified): No Known Drug Allergies  Patient Instructions: 1)  Your physician recommends that you continue on your current medications as directed. Please refer to the Current Medication list given to you today.

## 2010-04-27 NOTE — Progress Notes (Signed)
  Phone Note Call from Patient   Summary of Call: pt is scheduled for tcs on 04/03/10 @ 0730 and can not be there an hour early. He wants to know if he can change time to later that day. [phone] Initial call taken by: Minna Merritts,  March 28, 2010 1:27 PM     Appended Document:  this message was taken by Darl Pikes not Angelica Chessman  Appended Document:  Pt rescheduled to 11:15am on 04/03/10.Marland KitchenMarland KitchenPt aware of appt.

## 2010-04-27 NOTE — Letter (Addendum)
Summary: Patient Notice, Colon Biopsy Results  [facility]  [address]   [place], [place] [address]   Phone: [phone]  Fax: [phone]       April 21, 2010   [name] [address] [place], [place]  [address] 09-Apr-1929    Dear Mr. Wolter,  I am pleased to inform you that the biopsies taken during your recent colonoscopy did not show any evidence of cancer upon pathologic examination.  Additional information/recommendations:  No further action is needed at this time.  Please follow-up with your primary care physician for your other healthcare needs.   You should have a repeat colonoscopy examination  in 3 years.  Please call us if you are having persistent problems or have questions about your condition that have not been fully answered at this time.  Sincerely,    R. Roetta Sessions MD, FACP [facility] Ph: [phone]    Fax: [phone]   Appended Document: Patient Notice, Colon Biopsy Results letter mailed to pt  Appended Document: Patient Notice, Colon Biopsy Results reminder in epic folder

## 2010-04-27 NOTE — Letter (Signed)
Summary: BP LOG  BP LOG   Imported By: Faythe Ghee 03/21/2010 10:38:53  _____________________________________________________________________  External Attachment:    Type:   Image     Comment:   External Document

## 2010-04-27 NOTE — Letter (Signed)
Summary: TCS ORDER  TCS ORDER   Imported By: Ave Filter 03/14/2010 09:58:58  _____________________________________________________________________  External Attachment:    Type:   Image     Comment:   External Document

## 2010-04-27 NOTE — Assessment & Plan Note (Signed)
Summary: hx of polyps,consult for tcs/ss   Visit Type:  Initial Consult Referring Provider:  DonDiego Primary Care Provider:  Dr.Richard Dondiego  CC:  Consult for TCS.  History of Present Illness: [name] presents today for a consult prior to TCS, per Dr. Otilio Saber request. He does report a colonoscopy in 2006 in [place], [place]. These reports are not available today; reportedly had polyps. Denies abdominal pain, N/V. No melena or brbpr. No loss of appetite, no change in weight. Denies reflux, no dysphagia, odynophagia. Reports constipation "whole life".  Goes every 3-4 days. hard. Uses OTC laxatives, sometimes suppositories.   Current Medications (verified): 1)  Valium 5 Mg Tabs (Diazepam) .... Take As Needed 2)  Terazosin Hcl 5 Mg Caps (Terazosin Hcl) .... Take 1 Tablet By Mouth Once A Day 3)  Nitrostat 0.4 Mg Subl (Nitroglycerin) .Marland Kitchen.. 1 Tablet Under Tongue At Onset of Chest Pain; You May Repeat Every 5 Minutes For Up To 3 Doses. 4)  Cartia Xt 180 Mg Xr24h-Cap (Diltiazem Hcl Coated Beads) .... Take 1 Tab Daily 5)  Fluoxetine Hcl 20 Mg Caps (Fluoxetine Hcl) .... Take 1 Tab Daily 6)  Metoprolol Succinate 50 Mg Xr24h-Tab (Metoprolol Succinate) .... Take One Half  Tablet By Mouth Daily 7)  Aspir-Low 81 Mg Tbec (Aspirin) .... Take 1 Tablet By Mouth Once A Day 8)  Multivitamin .... Take 1 Tablet By Mouth Once A Day 9)  Colace 100 Mg Caps (Docusate Sodium) .... Take 1 By Mouth Two Times A Day 10)  Miralax  Powd (Polyethylene Glycol 3350) .... Take 1 Capful Daily As Needed For Constipation. Hold If Diarrhea 11)  Ibuprofen 800 Mg Tabs (Ibuprofen) .... Take 1 Tablet By Mouth Three Times A Day 12)  Naprosyn 500 Mg Tabs (Naproxen) .... Take 1 Tablet By Mouth Two Times A Day 13)  Flomax 0.4 Mg Caps (Tamsulosin Hcl) .... Take 1 Tablet By Mouth Once A Day  Allergies (verified): No Known Drug Allergies  Past History:  Past Medical History: Last updated: 03/24/2009 ASCVD-status post myocardial  infarction and percutaneous intervention-1993  PVCs-no symptoms DEGENERATIVE JOINT DISEASE (ICD-715.90) ERECTILE DYSFUNCTION, SECONDARY TO MEDICATION (ICD-607.84) HYPERLIPIDEMIA, MIXED (ICD-272.2) HYPERGLYCEMIA (ICD-790.29) HYPERTENSION (ICD-401.9) Testosterone deficiency Obesity Colonic polyp excised via colonoscope  Past Surgical History: Last updated: 03/24/2009 Laparoscopic knee surgeryx2  Transurethral procedure, either diagnostic or a transurethral resection of the bladder Cataract extraction -bilateral  Family History: Last updated: 03/13/2010 Mother:unknown Father:unknown  Social History: Last updated: 03/13/2010  non-smoker, ETOH abuse 20 years ago.  nodrug abuse, married, 2 children Retired from work as a Merchandiser, retail  Family History: Mother:unknown Father:unknown  Social History:  non-smoker, ETOH abuse 20 years ago.  nodrug abuse, married, 2 children Retired from work as a Engineer, maintenance (IT):  Denies fever, chills, and anorexia. Eyes:  Denies blurring, irritation, and discharge. ENT:  Denies sore throat, hoarseness, and difficulty swallowing. CV:  Denies chest pains, angina, and syncope. Resp:  Denies dyspnea at rest and wheezing. GI:  Complains of constipation; denies difficulty swallowing, nausea, indigestion/heartburn, abdominal pain, change in bowel habits, bloody BM's, and black BMs. GU:  Denies urinary burning and urinary frequency. MS:  Denies joint pain / LOM, joint swelling, and joint stiffness. Derm:  Denies rash, itching, and dry skin. Neuro:  Denies weakness and syncope. Psych:  Denies depression and anxiety. Endo:  Denies cold intolerance and heat intolerance. Heme:  Denies bruising and bleeding.  Vital Signs:  Patient profile:   77 year old male Height:  67 inches Weight:      187 pounds BMI:     29.39 Temp:     98.0 degrees F oral Pulse rate:   88 / minute BP sitting:   108 / 60  (left arm) Cuff size:    large  Vitals Entered By: Cloria Spring LPN (March 13, 2010 2:07 PM)  Physical Exam  General:  Well developed, well nourished, no acute distress. Head:  Normocephalic and atraumatic. Eyes:  without scleral icterus Mouth:  No deformity or lesions, dentition normal. Lungs:  Clear throughout to auscultation. Heart:  Regular rate and rhythm; no murmurs, rubs,  or bruits. Abdomen:  normal bowel sounds, without guarding, without rebound, no distesion, no tenderness, no masses, and no hepatomegally or splenomegaly.  normal bowel sounds and without guarding.   Msk:  Symmetrical with no gross deformities. Normal posture. Pulses:  Normal pulses noted. Extremities:  No clubbing, cyanosis, edema or deformities noted. Neurologic:  Alert and  oriented x4;  grossly normal neurologically. Skin:  Intact without significant lesions or rashes. Psych:  Alert and cooperative. Normal mood and affect.  Impression & Recommendations:  Problem # 1:  CONSTIPATION (ICD-15.69)  77 year old African-American male with "lifelong" hx of constipation, currently has BM every 3-4 days, hard, needs assistance of OTC laxatives. no current bowel regimen. No melena, brbpr. Last colonoscopy in 2006 in [place], no records available. Due for screening colonoscopy.   Retrieve records from [place] Colace 100 mg by mouth two times a day, hold if loose stools Miralax 1 capful daily as needed constipation, hold if loose stools 6-8 glasses of water daily Fiber of choice daily Set up for TCS with RMR: the risks, benefits, and alternatives have been discussed with pt; he desires to proceed and has given verbal consent.  Orders: Consultation Level III (29562)  Problem # 2:  SCREENING COLORECTAL-CANCER (ICD-V76.51)  see #1.   Orders: Consultation Level III [phone])  Patient Instructions: 1)  Colace twice a day for constipation, hold if diarrhea 2)  Miralax as needed daily for constipation, hold if diarrhea 3)   Drink 6-8 glasses of water daily 4)  Fiber of choice over the counter 5)  The medication list was reviewed and reconciled.  All changed / newly prescribed medications were explained.  A complete medication list was provided to the patient / caregiver.  Prescriptions: MIRALAX  POWD (POLYETHYLENE GLYCOL 3350) take 1 capful daily as needed for constipation. hold if diarrhea  #1 month x 5   Entered and Authorized by:   Gerrit Halls NP   Signed by:   Gerrit Halls NP on 03/13/2010   Method used:   Faxed to ...       [facility] [address] [phone]* (retail)       [address] [place], [place]  [address]       Ph: [phone]       Fax: [phone]   RxID:   [id number] COLACE 100 MG CAPS (DOCUSATE SODIUM) take 1 by mouth two times a day  #60 x 5   Entered and Authorized by:   Gerrit Halls NP   Signed by:   Gerrit Halls NP on 03/13/2010   Method used:   Faxed to ...       [facility] [address] [phone]* (retail)       [address] [place]  [address]       Ph: [phone]  Fax: [phone]   RxID:   [id number]   Appended Document: hx of polyps,consult for tcs/ss received colonoscopy reports from April 2006 performed secondary to hematochezia, report states "post-hemorrhagic anemia" by Dr. Gaspar Skeeters: 1. Four 5 to 11mm polyps sigmoid colon, resected/retrieved 2. Diverticulosis, suspect hematochezia secondary to diverticular bleed. 3. Repeat in 5 years Path: adenomatous polyps

## 2010-05-03 NOTE — Letter (Signed)
Summary: BP LOG  BP LOG   Imported By: Faythe Ghee 04/25/2010 15:58:22  _____________________________________________________________________  External Attachment:    Type:   Image     Comment:   External Document

## 2010-05-22 ENCOUNTER — Encounter: Payer: Self-pay | Admitting: Urgent Care

## 2010-06-01 NOTE — Medication Information (Signed)
Summary: BISACODYL 5 MG  BISACODYL 5 MG   Imported By: Rexene Alberts 05/22/2010 08:28:32  _____________________________________________________________________  External Attachment:    Type:   Image     Comment:   External Document  Appended Document: BISACODYL 5 MG Is pt taking this on a regular basis? I believe this was for his colon prep.  Appended Document: BISACODYL 5 MG pharmacy informed

## 2010-07-02 LAB — BASIC METABOLIC PANEL
BUN: 9 mg/dL (ref 6–23)
CO2: 29 mEq/L (ref 19–32)
Calcium: 9.9 mg/dL (ref 8.4–10.5)
Chloride: 107 mEq/L (ref 96–112)
Creatinine, Ser: 0.98 mg/dL (ref 0.4–1.5)
GFR calc Af Amer: >60 mL/min (ref >60)
GFR calc non Af Amer: >60 mL/min (ref >60)
Glucose, Bld: 101 mg/dL — ABNORMAL HIGH (ref 70–99)
Potassium: 5 mEq/L (ref 3.5–5.1)
Sodium: 140 mEq/L (ref 135–145)

## 2010-07-02 LAB — HEMOGLOBIN AND HEMATOCRIT, BLOOD
HCT: 41.2 % (ref 39.0–52.0)
Hemoglobin: 14.4 g/dL (ref 13.0–17.0)

## 2010-10-12 ENCOUNTER — Other Ambulatory Visit: Payer: Self-pay | Admitting: Cardiology

## 2010-12-18 LAB — BASIC METABOLIC PANEL
BUN: 12
CO2: 29
Calcium: 9.4
Chloride: 103
Creatinine, Ser: 1.12
GFR calc Af Amer: >60
GFR calc non Af Amer: >60
Glucose, Bld: 101 — ABNORMAL HIGH
Potassium: 3.9
Sodium: 136

## 2010-12-18 LAB — CBC
HCT: 47.5
Hemoglobin: 16.6
MCHC: 34.9
MCV: 97.5
Platelets: 191
RBC: 4.87
RDW: 15
WBC: 4.4

## 2010-12-18 LAB — DIFFERENTIAL
Basophils Absolute: 0
Basophils Relative: 1
Eosinophils Absolute: 0.1
Eosinophils Relative: 2
Lymphocytes Relative: 46
Lymphs Abs: 2.1
Monocytes Absolute: 0.5
Monocytes Relative: 12
Neutro Abs: 1.7
Neutrophils Relative %: 38 — ABNORMAL LOW

## 2010-12-18 LAB — D-DIMER, QUANTITATIVE: D-Dimer, Quant: 0.32

## 2011-01-22 ENCOUNTER — Encounter: Payer: Self-pay | Admitting: Adult Health

## 2011-01-22 ENCOUNTER — Ambulatory Visit (INDEPENDENT_AMBULATORY_CARE_PROVIDER_SITE_OTHER): Payer: Self-pay | Admitting: Adult Health

## 2011-01-22 DIAGNOSIS — R5381 Other malaise: Secondary | ICD-10-CM

## 2011-01-22 DIAGNOSIS — E785 Hyperlipidemia, unspecified: Secondary | ICD-10-CM

## 2011-01-22 DIAGNOSIS — R5383 Other fatigue: Secondary | ICD-10-CM

## 2011-01-22 DIAGNOSIS — E782 Mixed hyperlipidemia: Secondary | ICD-10-CM

## 2011-01-22 DIAGNOSIS — I10 Essential (primary) hypertension: Secondary | ICD-10-CM

## 2011-01-22 DIAGNOSIS — I2510 Atherosclerotic heart disease of native coronary artery without angina pectoris: Secondary | ICD-10-CM

## 2011-01-22 MED ORDER — TERAZOSIN HCL 5 MG PO CAPS: 5.0000 mg | ORAL_CAPSULE | Freq: Every day | ORAL | Status: DC

## 2011-01-22 MED ORDER — METOPROLOL SUCCINATE ER 25 MG PO TB24: 25.0000 mg | ORAL_TABLET | Freq: Every day | ORAL | Status: DC

## 2011-01-22 MED ORDER — DILTIAZEM HCL ER COATED BEADS 180 MG PO CP24: 180.0000 mg | ORAL_CAPSULE | Freq: Every day | ORAL | Status: DC

## 2011-01-22 MED ORDER — NITROGLYCERIN 0.4 MG SL SUBL: 0.4000 mg | SUBLINGUAL_TABLET | SUBLINGUAL | Status: DC | PRN

## 2011-01-22 NOTE — Patient Instructions (Signed)
**Note De-Identified Wandy Bossler Obfuscation** Your physician recommends that you continue on your current medications as directed. Please refer to the Current Medication list given to you today. Please see list given to you at today's visit.  Your physician recommends that you return for lab work in: today  Your physician recommends that you schedule a follow-up appointment in: 6 months

## 2011-01-22 NOTE — Assessment & Plan Note (Signed)
Currently not well controlled at present.  He has not taken his medications today. Will follow this on medications when he takes them on next appointment, should he keep it.

## 2011-01-22 NOTE — Assessment & Plan Note (Signed)
He complains of lack of energy but is usually very sedentary.  He is also not always compliant with medications or consistent in filling them at pharmacy.  I have refilled diltiazem, metoprolol (asked him to take it at night), NTG and terazosin.  He will have labs completed to include TSH, CMET, CBC and testosterone level. Any abnormalities that can be corrected by PCP will be forwarded to them.

## 2011-01-22 NOTE — Progress Notes (Signed)
HPI: [name] is a 77 y/o patient of Dr. Dietrich Pates we are seeing for ongoing assessment and treatment of CAD and cardiovascular risk factors.  He complains of lack of energy and depression. He is not certain what medications he is on or what he needs refilled. He has a history of medical noncompliance and confusion with medications.  On last visit with Dr. Dietrich Pates he had stopped taking some of his medications and pharmacy had no records of consistent refills on others. He has not been seen by PCP, Dr. Delbert Harness consistently either.   No Known Allergies  Current Outpatient Prescriptions  Medication Sig Dispense Refill  . aspirin 81 MG tablet Take 81 mg by mouth daily.        . diazepam (VALIUM) 5 MG tablet Take 5 mg by mouth every 6 (six) hours as needed.        . diltiazem (CARDIZEM CD) 180 MG 24 hr capsule Take 1 capsule (180 mg total) by mouth daily.  30 capsule  6  . metoprolol (TOPROL-XL) 25 MG 24 hr tablet Take 1 tablet (25 mg total) by mouth daily.  30 tablet  6  . nitroGLYCERIN (NITROSTAT) 0.4 MG SL tablet Place 1 tablet (0.4 mg total) under the tongue every 5 (five) minutes as needed.  25 tablet  3  . terazosin (HYTRIN) 5 MG capsule Take 1 capsule (5 mg total) by mouth at bedtime.  30 capsule  6  . DISCONTD: diltiazem (CARDIZEM CD) 180 MG 24 hr capsule Take 180 mg by mouth daily.        Marland Kitchen DISCONTD: nitroGLYCERIN (NITROSTAT) 0.4 MG SL tablet Place 0.4 mg under the tongue every 5 (five) minutes as needed.        Marland Kitchen DISCONTD: terazosin (HYTRIN) 5 MG capsule Take 5 mg by mouth at bedtime.        Marland Kitchen DISCONTD: terazosin (HYTRIN) 5 MG capsule Take 5 mg by mouth at bedtime.          Past Medical History  Diagnosis Date  . ASCVD (arteriosclerotic cardiovascular disease) 1993    Status post MI and percutaneous intervention  . PVC (premature ventricular contraction)     No symptoms  . DJD (degenerative joint disease)   . ED (erectile dysfunction)     Secondary to medication  . Hyperlipidemia    . Hyperglycemia   . HTN (hypertension)   . Testosterone deficiency   . Obesity   . Colonic polyp     excised via colonoscope    Past Surgical History  Procedure Date  . Knee surgery     Laparoscopic x 2  . Cataract extraction, bilateral   . Transurethral resection of bladder     Either diagnostic or a    [id number] of systems complete and found to be negative unless listed above PHYSICAL EXAM BP 153/85  Pulse 51  Ht 5\' 7"  (1.702 m)  Wt 192 lb (87.091 kg)  BMI 30.07 kg/m2  SpO2 98%(Not taken medications today) General: Well developed, well nourished, in no acute distress Head: Eyes PERRLA, No xanthomas.   Normal cephalic and atramatic  Lungs: Clear bilaterally to auscultation.Marland Kitchen Heart: HRRR S1 S2, 1/6 systolic murmur.  Pulses are 2+ & equal.            No carotid bruit. No JVD.  No abdominal bruits. No femoral bruits. Abdomen: Bowel sounds are positive, abdomen soft and non-tender without masses or  Hernia's noted. Msk:  Back normal, normal gait. Normal strength and tone for age. Extremities: No clubbing, cyanosis or edema.  DP +1 Neuro: Alert and oriented X 3. Psych:  Good affect, responds appropriately  EKG:  SR with PVC's RBBB with LAFB. Rate of 100bpm  ASSESSMENT AND PLAN

## 2011-01-22 NOTE — Assessment & Plan Note (Signed)
He has not had lipids drawn recently but states his PCP is watching those levels. He does not wish to come back to have fasting labs to monitor this.

## 2011-01-23 LAB — COMPREHENSIVE METABOLIC PANEL
AST: 26 U/L (ref 0–37)
Albumin: 4.6 g/dL (ref 3.5–5.2)
Alkaline Phosphatase: 58 U/L (ref 39–117)
BUN: 14 mg/dL (ref 6–23)
Creat: 1.03 mg/dL (ref 0.50–1.35)
Potassium: 4.9 mEq/L (ref 3.5–5.3)
Total Bilirubin: 0.4 mg/dL (ref 0.3–1.2)

## 2011-01-23 LAB — CBC WITH DIFFERENTIAL/PLATELET
Eosinophils Relative: 3 % (ref 0–5)
HCT: 42.3 % (ref 39.0–52.0)
Hemoglobin: 14.2 g/dL (ref 13.0–17.0)
Lymphocytes Relative: 54 % — ABNORMAL HIGH (ref 12–46)
Lymphs Abs: 1.9 10*3/uL (ref 0.7–4.0)
MCV: 97.5 fL (ref 78.0–100.0)
Monocytes Absolute: 0.3 10*3/uL (ref 0.1–1.0)
Monocytes Relative: 10 % (ref 3–12)
Neutro Abs: 1.2 10*3/uL — ABNORMAL LOW (ref 1.7–7.7)
RBC: 4.34 MIL/uL (ref 4.22–5.81)
WBC: 3.5 10*3/uL — ABNORMAL LOW (ref 4.0–10.5)

## 2011-01-23 LAB — TESTOSTERONE: Testosterone: 211.17 ng/dL — ABNORMAL LOW (ref 250–890)

## 2011-01-23 NOTE — Progress Notes (Signed)
Addended by: Reather Laurence A on: 01/23/2011 09:45 AM   Modules accepted: Orders

## 2011-04-19 ENCOUNTER — Other Ambulatory Visit (HOSPITAL_COMMUNITY): Payer: Self-pay | Admitting: Orthopaedic Surgery

## 2011-04-19 DIAGNOSIS — M25511 Pain in right shoulder: Secondary | ICD-10-CM

## 2011-04-20 ENCOUNTER — Other Ambulatory Visit (HOSPITAL_COMMUNITY): Payer: Self-pay

## 2011-05-08 ENCOUNTER — Telehealth: Payer: Self-pay | Admitting: Cardiology

## 2011-05-08 NOTE — Telephone Encounter (Signed)
CALLING TO SEE IF HE NEEDS PRE- ANTIBIOTICS BEFORE SINGLE TOOTH EXTRACTION. FAX [phone]

## 2011-05-09 ENCOUNTER — Ambulatory Visit (HOSPITAL_COMMUNITY)
Admission: RE | Admit: 2011-05-09 | Discharge: 2011-05-09 | Disposition: A | Discharge status: Home / Self Care | Payer: PRIVATE HEALTH INSURANCE | Source: Ambulatory Visit | Attending: Orthopaedic Surgery | Admitting: Orthopaedic Surgery

## 2011-05-09 DIAGNOSIS — M25511 Pain in right shoulder: Secondary | ICD-10-CM

## 2011-05-14 ENCOUNTER — Telehealth: Payer: Self-pay | Admitting: *Deleted

## 2011-05-14 NOTE — Telephone Encounter (Signed)
Wife notified that patient does not need pre medicated wit abx prior to dental procedures per Dr Dietrich Pates.

## 2011-10-09 ENCOUNTER — Other Ambulatory Visit: Payer: Self-pay

## 2011-10-09 ENCOUNTER — Emergency Department (HOSPITAL_COMMUNITY)
Admission: EM | Admit: 2011-10-09 | Discharge: 2011-10-09 | Disposition: A | Discharge status: Home / Self Care | Payer: PRIVATE HEALTH INSURANCE | Attending: Emergency Medicine | Admitting: Emergency Medicine

## 2011-10-09 ENCOUNTER — Encounter (HOSPITAL_COMMUNITY): Payer: Self-pay | Admitting: Emergency Medicine

## 2011-10-09 ENCOUNTER — Emergency Department (HOSPITAL_COMMUNITY): Payer: PRIVATE HEALTH INSURANCE

## 2011-10-09 DIAGNOSIS — N529 Male erectile dysfunction, unspecified: Secondary | ICD-10-CM | POA: Insufficient documentation

## 2011-10-09 DIAGNOSIS — E785 Hyperlipidemia, unspecified: Secondary | ICD-10-CM | POA: Insufficient documentation

## 2011-10-09 DIAGNOSIS — Z860100 Personal history of colon polyps, unspecified: Secondary | ICD-10-CM | POA: Insufficient documentation

## 2011-10-09 DIAGNOSIS — Z9849 Cataract extraction status, unspecified eye: Secondary | ICD-10-CM | POA: Insufficient documentation

## 2011-10-09 DIAGNOSIS — W57XXXA Bitten or stung by nonvenomous insect and other nonvenomous arthropods, initial encounter: Secondary | ICD-10-CM

## 2011-10-09 DIAGNOSIS — E669 Obesity, unspecified: Secondary | ICD-10-CM | POA: Insufficient documentation

## 2011-10-09 DIAGNOSIS — M1990 Unspecified osteoarthritis, unspecified site: Secondary | ICD-10-CM | POA: Insufficient documentation

## 2011-10-09 DIAGNOSIS — S90569A Insect bite (nonvenomous), unspecified ankle, initial encounter: Secondary | ICD-10-CM | POA: Insufficient documentation

## 2011-10-09 DIAGNOSIS — I10 Essential (primary) hypertension: Secondary | ICD-10-CM | POA: Insufficient documentation

## 2011-10-09 DIAGNOSIS — Z7982 Long term (current) use of aspirin: Secondary | ICD-10-CM | POA: Insufficient documentation

## 2011-10-09 LAB — BASIC METABOLIC PANEL
BUN: 10 mg/dL (ref 6–23)
CO2: 24 mEq/L (ref 19–32)
GFR calc non Af Amer: 68 mL/min — ABNORMAL LOW (ref >90)
Glucose, Bld: 132 mg/dL — ABNORMAL HIGH (ref 70–99)
Potassium: 3.8 mEq/L (ref 3.5–5.1)

## 2011-10-09 LAB — CBC WITH DIFFERENTIAL/PLATELET
Basophils Relative: 0 % (ref 0–1)
HCT: 40.2 % (ref 39.0–52.0)
Hemoglobin: 13.6 g/dL (ref 13.0–17.0)
MCH: 32.3 pg (ref 26.0–34.0)
MCHC: 33.8 g/dL (ref 30.0–36.0)
Monocytes Absolute: 0.4 10*3/uL (ref 0.1–1.0)
Monocytes Relative: 9 % (ref 3–12)
Neutro Abs: 1.3 10*3/uL — ABNORMAL LOW (ref 1.7–7.7)

## 2011-10-09 MED ORDER — ONDANSETRON 4 MG PO TBDP
4.0000 mg | ORAL_TABLET | Freq: Once | ORAL | Status: AC
  Administered 2011-10-09: 4 mg via ORAL
  Filled 2011-10-09: qty 1

## 2011-10-09 NOTE — ED Notes (Signed)
Pt c/o insect bite/sting to left thigh at 1500 today. Pt also c/o blurry vission and nausea x 30 minutes.

## 2011-10-09 NOTE — ED Provider Notes (Signed)
History     CSN: [id number]  Arrival date & time 10/09/11  1820   First MD Initiated Contact with Patient 10/09/11 1952      Chief Complaint  Patient presents with  . Insect Bite    (Consider location/radiation/quality/duration/timing/severity/associated sxs/prior treatment) HPI Comments: The patient is an 78 year old man who says they insect stung him on his left anterior thigh around 3 PM today. He thought it could have been a bee or a spider, but he did not see the insect. After this, his face tightened up, his vision was blurry, and he had an upset stomach. This happened around 3 PM. Now at 8 PM he still feels somewhat nauseated. He denies chest pain. The pain around the insect bite comes and goes. He had no prior reaction to insect bites. His past history is noteworthy for coronary disease, hypertension and high cholesterol. He has had prior coronary artery stenting.  Patient is a 78 y.o. male presenting with general illness. The history is provided by the patient and medical records. No language interpreter was used.  Illness  The current episode started today. The onset was sudden (He got stung by an insect on the left anterior thigh around 3 PM today. This caused tightness in his face, blurred vision, and a feeling of nausea in his abdomen and a feeling of nausea and abdominal pain). Episode frequency: The feeling of nausea has persisted. Pain at the site of the insect bite comes and goes. The problem is moderate. Nothing relieves the symptoms. Nothing aggravates the symptoms. Associated symptoms include nausea. Pertinent negatives include no fever and no vomiting. Decreased vision: blurred vision. Associated symptoms comments: Tightness in his face.. Recent Medical Care: He is followed for heart disease by [facility] cardiology, and his general medical doctor is Dr. Delbert Harness.    Past Medical History  Diagnosis Date  . ASCVD (arteriosclerotic cardiovascular disease) 1993    Status post  MI and percutaneous intervention  . PVC (premature ventricular contraction)     No symptoms  . DJD (degenerative joint disease)   . ED (erectile dysfunction)     Secondary to medication  . Hyperlipidemia   . Hyperglycemia   . HTN (hypertension)   . Testosterone deficiency   . Obesity   . Colonic polyp     excised via colonoscope    Past Surgical History  Procedure Date  . Knee surgery     Laparoscopic x 2  . Cataract extraction, bilateral   . Transurethral resection of bladder     Either diagnostic or a    No family history on file.  History  Substance Use Topics  . Smoking status: Never Smoker   . Smokeless tobacco: Not on file  . Alcohol Use: Not on file      Review of Systems  Constitutional: Negative for fever and chills.  HENT:       Facial tightness.  Eyes:       Blurred vision.  Respiratory: Negative.   Cardiovascular: Negative.  Negative for chest pain.  Gastrointestinal: Positive for nausea. Negative for vomiting.  Genitourinary: Negative.   Musculoskeletal: Negative.   Skin: Negative.   Neurological: Negative.   Psychiatric/Behavioral: Negative.     Allergies  Review of patient's allergies indicates no known allergies.  Home Medications   Current Outpatient Rx  Name Route Sig Dispense Refill  . ASPIRIN 81 MG PO TABS Oral Take 81 mg by mouth daily.      Marland Kitchen DILTIAZEM HCL ER  COATED BEADS 180 MG PO CP24 Oral Take 1 capsule (180 mg total) by mouth daily. 30 capsule 6  . METOPROLOL SUCCINATE ER 25 MG PO TB24 Oral Take 1 tablet (25 mg total) by mouth daily. 30 tablet 6  . NITROGLYCERIN 0.4 MG SL SUBL Sublingual Place 1 tablet (0.4 mg total) under the tongue every 5 (five) minutes as needed. 25 tablet 3  . TERAZOSIN HCL 5 MG PO CAPS Oral Take 1 capsule (5 mg total) by mouth at bedtime. 30 capsule 6    BP 170/72  Pulse 85  Temp 98.2 F (36.8 C) (Oral)  Resp 16  Ht 5\' 7"  (1.702 m)  Wt 175 lb (79.379 kg)  BMI 27.41 kg/m2  SpO2 96%  Physical  Exam  Nursing note and vitals reviewed. Constitutional: He is oriented to person, place, and time. He appears well-developed and well-nourished. No distress.  HENT:  Head: Normocephalic and atraumatic.  Right Ear: External ear normal.  Left Ear: External ear normal.  Mouth/Throat: Oropharynx is clear and moist.  Eyes: Conjunctivae and EOM are normal. Pupils are equal, round, and reactive to light. No scleral icterus.  Neck: Normal range of motion. Neck supple.  Cardiovascular: Normal rate, regular rhythm and normal heart sounds.   Pulmonary/Chest: Effort normal and breath sounds normal.  Abdominal: Soft. Bowel sounds are normal.  Musculoskeletal: Normal range of motion.  Neurological: He is alert and oriented to person, place, and time.       No sensory or motor deficit.  Skin: Skin is warm and dry.       I could not see any bite mark on the patient's left anterior thigh, there is no rash or urticaria.  Psychiatric: He has a normal mood and affect. His behavior is normal.    ED Course  Procedures (including critical care time)  Labs Reviewed  GLUCOSE, CAPILLARY - Abnormal; Notable for the following:    Glucose-Capillary 150 (*)     All other components within normal limits  CBC WITH DIFFERENTIAL  LIPASE, BLOOD   8:04 PM Pt was seen and had physical examination.  Lab workup ordered.  ODT Zofran ordered.   Date: 10/09/2011  Rate: 78  Rhythm: normal sinus rhythm  QRS Axis: left  Intervals: normal  ST/T Wave abnormalities: normal  Conduction Disutrbances:right bundle branch block and left anterior fascicular block  Narrative Interpretation: Abnormal EKG  Old EKG Reviewed: changes noted--PVC's present on tracing of 10/13/2008 have ceased.   9:50 PM Results for orders placed during the hospital encounter of 10/09/11  GLUCOSE, CAPILLARY      Component Value Range   Glucose-Capillary 150 (*) 70 - 99 mg/dL   Comment 1 Documented in Chart    CBC WITH DIFFERENTIAL       Component Value Range   WBC 3.6 (*) 4.0 - 10.5 K/uL   RBC 4.21 (*) 4.22 - 5.81 MIL/uL   Hemoglobin 13.6  13.0 - 17.0 g/dL   HCT 56.2  13.0 - 86.5 %   MCV 95.5  78.0 - 100.0 fL   MCH 32.3  26.0 - 34.0 pg   MCHC 33.8  30.0 - 36.0 g/dL   RDW 78.4  69.6 - 29.5 %   Platelets 134 (*) 150 - 400 K/uL   Neutrophils Relative 37 (*) 43 - 77 %   Neutro Abs 1.3 (*) 1.7 - 7.7 K/uL   Lymphocytes Relative 52 (*) 12 - 46 %   Lymphs Abs 1.9  0.7 - 4.0 K/uL  Monocytes Relative 9  3 - 12 %   Monocytes Absolute 0.4  0.1 - 1.0 K/uL   Eosinophils Relative 2  0 - 5 %   Eosinophils Absolute 0.1  0.0 - 0.7 K/uL   Basophils Relative 0  0 - 1 %   Basophils Absolute 0.0  0.0 - 0.1 K/uL  LIPASE, BLOOD      Component Value Range   Lipase 14  11 - 59 U/L  BASIC METABOLIC PANEL      Component Value Range   Sodium 137  135 - 145 mEq/L   Potassium 3.8  3.5 - 5.1 mEq/L   Chloride 102  96 - 112 mEq/L   CO2 24  19 - 32 mEq/L   Glucose, Bld 132 (*) 70 - 99 mg/dL   BUN 10  6 - 23 mg/dL   Creatinine, Ser 1.61  0.50 - 1.35 mg/dL   Calcium 9.7  8.4 - 09.6 mg/dL   GFR calc non Af Amer 68 (*) >90 mL/min   GFR calc Af Amer 79 (*) >90 mL/min  TROPONIN I      Component Value Range   Troponin I <0.30  <0.30 ng/mL   Dg Abd Acute W/chest  10/09/2011  *RADIOLOGY REPORT*  Clinical Data: Nausea and abdominal discomfort.  ACUTE ABDOMEN SERIES (ABDOMEN 2 VIEW & CHEST 1 VIEW)  Comparison: 04/29/2007 chest x-ray.  Findings: No infiltrate, congestive heart failure or pneumothorax.  Calcified slightly tortuous thoracic aorta.  Heart size top normal.  Central pulmonary vascular prominence stable.  Stool throughout the colon.  No small bowel dilation.  No free intraperitoneal air.  Calcified vasculature.  Lumbar spine degenerative changes.  IMPRESSION: No plain film evidence of bowel obstruction or free intraperitoneal air.  Original Report Authenticated By: Fuller Canada, M.D.    Lab workup was negative.  Pt feels better.   Released.   1. Insect bite            Carleene Cooper III, MD 10/09/11 2154

## 2011-11-01 ENCOUNTER — Other Ambulatory Visit (HOSPITAL_COMMUNITY): Payer: Self-pay | Admitting: Orthopaedic Surgery

## 2011-11-07 NOTE — Progress Notes (Signed)
Patient was called numerous times since 11/01/11.  Mailbox of phone full.  Office called on 11/05/11 and was informed unable to get in touch with patient.  Office had no other numbers except for same phone number we had.  Patient was called again on 11/06/11 and mailbox remains full.  On 11/07/11 attempted to call patient again and mailbox remains full.  Office made aware and they have no new contact numbers.  Office was made aware on 11/05/11 and on 11/07/11 unable to get in touch with patient.  Short Stay was made aware unable to get in touch with patient.

## 2011-11-08 ENCOUNTER — Encounter (HOSPITAL_COMMUNITY): Payer: Self-pay | Admitting: Certified Registered Nurse Anesthetist

## 2011-11-08 ENCOUNTER — Encounter (HOSPITAL_COMMUNITY): Payer: Self-pay | Admitting: Orthopedic Surgery

## 2011-11-08 ENCOUNTER — Ambulatory Visit (HOSPITAL_COMMUNITY)
Admission: RE | Admit: 2011-11-08 | Discharge: 2011-11-09 | Disposition: A | Discharge status: Home / Self Care | Payer: PRIVATE HEALTH INSURANCE | Source: Ambulatory Visit | Attending: Orthopaedic Surgery | Admitting: Orthopaedic Surgery

## 2011-11-08 ENCOUNTER — Encounter (HOSPITAL_COMMUNITY): Payer: Self-pay | Admitting: *Deleted

## 2011-11-08 ENCOUNTER — Ambulatory Visit (HOSPITAL_COMMUNITY): Payer: PRIVATE HEALTH INSURANCE | Admitting: Certified Registered Nurse Anesthetist

## 2011-11-08 ENCOUNTER — Encounter (HOSPITAL_COMMUNITY)
Admission: RE | Disposition: A | Discharge status: Home / Self Care | Payer: Self-pay | Source: Ambulatory Visit | Attending: Orthopaedic Surgery

## 2011-11-08 DIAGNOSIS — I10 Essential (primary) hypertension: Secondary | ICD-10-CM | POA: Insufficient documentation

## 2011-11-08 DIAGNOSIS — M75100 Unspecified rotator cuff tear or rupture of unspecified shoulder, not specified as traumatic: Secondary | ICD-10-CM | POA: Insufficient documentation

## 2011-11-08 DIAGNOSIS — Z7982 Long term (current) use of aspirin: Secondary | ICD-10-CM | POA: Insufficient documentation

## 2011-11-08 DIAGNOSIS — M719 Bursopathy, unspecified: Secondary | ICD-10-CM | POA: Insufficient documentation

## 2011-11-08 DIAGNOSIS — E785 Hyperlipidemia, unspecified: Secondary | ICD-10-CM | POA: Insufficient documentation

## 2011-11-08 DIAGNOSIS — I252 Old myocardial infarction: Secondary | ICD-10-CM | POA: Insufficient documentation

## 2011-11-08 DIAGNOSIS — M19019 Primary osteoarthritis, unspecified shoulder: Secondary | ICD-10-CM | POA: Insufficient documentation

## 2011-11-08 DIAGNOSIS — IMO0002 Reserved for concepts with insufficient information to code with codable children: Secondary | ICD-10-CM | POA: Insufficient documentation

## 2011-11-08 DIAGNOSIS — M67919 Unspecified disorder of synovium and tendon, unspecified shoulder: Secondary | ICD-10-CM | POA: Insufficient documentation

## 2011-11-08 DIAGNOSIS — M25819 Other specified joint disorders, unspecified shoulder: Secondary | ICD-10-CM | POA: Insufficient documentation

## 2011-11-08 DIAGNOSIS — M7540 Impingement syndrome of unspecified shoulder: Secondary | ICD-10-CM

## 2011-11-08 DIAGNOSIS — Z79899 Other long term (current) drug therapy: Secondary | ICD-10-CM | POA: Insufficient documentation

## 2011-11-08 DIAGNOSIS — I2510 Atherosclerotic heart disease of native coronary artery without angina pectoris: Secondary | ICD-10-CM | POA: Insufficient documentation

## 2011-11-08 LAB — CBC
HCT: 37.9 % — ABNORMAL LOW (ref 39.0–52.0)
Hemoglobin: 13.3 g/dL (ref 13.0–17.0)
MCV: 94.3 fL (ref 78.0–100.0)
RBC: 4.02 MIL/uL — ABNORMAL LOW (ref 4.22–5.81)
WBC: 4.9 10*3/uL (ref 4.0–10.5)

## 2011-11-08 LAB — BASIC METABOLIC PANEL
CO2: 24 mEq/L (ref 19–32)
Chloride: 102 mEq/L (ref 96–112)
Potassium: 3.7 mEq/L (ref 3.5–5.1)
Sodium: 137 mEq/L (ref 135–145)

## 2011-11-08 LAB — SURGICAL PCR SCREEN: Staphylococcus aureus: POSITIVE — AB

## 2011-11-08 SURGERY — SHOULDER ARTHROSCOPY WITH SUBACROMIAL DECOMPRESSION
Anesthesia: Regional | Site: Shoulder | Laterality: Right | Wound class: Clean

## 2011-11-08 MED ORDER — EPINEPHRINE HCL 1 MG/ML IJ SOLN
INTRAMUSCULAR | Status: DC | PRN
  Administered 2011-11-08: 1 mg

## 2011-11-08 MED ORDER — ACETAMINOPHEN 10 MG/ML IV SOLN
INTRAVENOUS | Status: DC | PRN
  Administered 2011-11-08: 1000 mg via INTRAVENOUS

## 2011-11-08 MED ORDER — DIPHENHYDRAMINE HCL 12.5 MG/5ML PO ELIX: 12.5000 mg | ORAL_SOLUTION | ORAL | Status: DC | PRN

## 2011-11-08 MED ORDER — CEFAZOLIN SODIUM-DEXTROSE 2-3 GM-% IV SOLR
2.0000 g | INTRAVENOUS | Status: AC
  Administered 2011-11-08: 2 g via INTRAVENOUS

## 2011-11-08 MED ORDER — NEOSTIGMINE METHYLSULFATE 1 MG/ML IJ SOLN
INTRAMUSCULAR | Status: DC | PRN
  Administered 2011-11-08: 4 mg via INTRAVENOUS

## 2011-11-08 MED ORDER — PANTOPRAZOLE SODIUM 40 MG PO TBEC
40.0000 mg | DELAYED_RELEASE_TABLET | Freq: Every day | ORAL | Status: DC
  Administered 2011-11-09: 40 mg via ORAL
  Filled 2011-11-08 (×2): qty 1

## 2011-11-08 MED ORDER — ZOLPIDEM TARTRATE 5 MG PO TABS: 5.0000 mg | ORAL_TABLET | Freq: Every evening | ORAL | Status: DC | PRN

## 2011-11-08 MED ORDER — FENTANYL CITRATE 0.05 MG/ML IJ SOLN
INTRAMUSCULAR | Status: DC | PRN
  Administered 2011-11-08 (×4): 25 ug via INTRAVENOUS

## 2011-11-08 MED ORDER — MORPHINE SULFATE 4 MG/ML IJ SOLN
INTRAMUSCULAR | Status: DC | PRN
  Administered 2011-11-08: 4 mg

## 2011-11-08 MED ORDER — ROPIVACAINE HCL 5 MG/ML IJ SOLN
INTRAMUSCULAR | Status: AC
  Filled 2011-11-08: qty 30

## 2011-11-08 MED ORDER — TAMSULOSIN HCL 0.4 MG PO CAPS
0.4000 mg | ORAL_CAPSULE | Freq: Every day | ORAL | Status: DC
  Administered 2011-11-08 – 2011-11-09 (×2): 0.4 mg via ORAL
  Filled 2011-11-08 (×2): qty 1

## 2011-11-08 MED ORDER — LIDOCAINE HCL (CARDIAC) 20 MG/ML IV SOLN
INTRAVENOUS | Status: DC | PRN
  Administered 2011-11-08: 100 mg via INTRAVENOUS

## 2011-11-08 MED ORDER — ONDANSETRON HCL 4 MG/2ML IJ SOLN: 4.0000 mg | Freq: Four times a day (QID) | INTRAMUSCULAR | Status: DC | PRN

## 2011-11-08 MED ORDER — CEFAZOLIN SODIUM-DEXTROSE 2-3 GM-% IV SOLR
INTRAVENOUS | Status: AC
  Filled 2011-11-08: qty 50

## 2011-11-08 MED ORDER — VITAMIN B-12 1000 MCG PO TABS
1000.0000 ug | ORAL_TABLET | Freq: Every day | ORAL | Status: DC
  Administered 2011-11-09: 1000 ug via ORAL
  Filled 2011-11-08: qty 1

## 2011-11-08 MED ORDER — ASPIRIN 81 MG PO TABS: 81.0000 mg | ORAL_TABLET | Freq: Every day | ORAL | Status: DC

## 2011-11-08 MED ORDER — GLYCOPYRROLATE 0.2 MG/ML IJ SOLN
INTRAMUSCULAR | Status: DC | PRN
  Administered 2011-11-08: 0.4 mg via INTRAVENOUS

## 2011-11-08 MED ORDER — PHENYLEPHRINE HCL 10 MG/ML IJ SOLN
10.0000 mg | INTRAVENOUS | Status: DC | PRN
  Administered 2011-11-08: 50 ug/min via INTRAVENOUS

## 2011-11-08 MED ORDER — MORPHINE SULFATE 10 MG/ML IJ SOLN
2.0000 mg | INTRAMUSCULAR | Status: DC | PRN
  Administered 2011-11-09 (×2): 2 mg via INTRAVENOUS
  Filled 2011-11-08 (×2): qty 1

## 2011-11-08 MED ORDER — OXYCODONE HCL 5 MG/5ML PO SOLN
5.0000 mg | Freq: Once | ORAL | Status: DC | PRN
  Filled 2011-11-08: qty 5

## 2011-11-08 MED ORDER — DILTIAZEM HCL ER COATED BEADS 180 MG PO CP24
180.0000 mg | ORAL_CAPSULE | Freq: Every day | ORAL | Status: DC
  Administered 2011-11-08 – 2011-11-09 (×2): 180 mg via ORAL
  Filled 2011-11-08 (×2): qty 1

## 2011-11-08 MED ORDER — DUTASTERIDE 0.5 MG PO CAPS
0.5000 mg | ORAL_CAPSULE | Freq: Every day | ORAL | Status: DC
  Administered 2011-11-08 – 2011-11-09 (×2): 0.5 mg via ORAL
  Filled 2011-11-08 (×2): qty 1

## 2011-11-08 MED ORDER — ACETAMINOPHEN 10 MG/ML IV SOLN: 1000.0000 mg | Freq: Once | INTRAVENOUS | Status: DC | PRN

## 2011-11-08 MED ORDER — NITROGLYCERIN 0.4 MG SL SUBL: 0.4000 mg | SUBLINGUAL_TABLET | SUBLINGUAL | Status: DC | PRN

## 2011-11-08 MED ORDER — MUPIROCIN 2 % EX OINT
TOPICAL_OINTMENT | Freq: Two times a day (BID) | CUTANEOUS | Status: DC
  Filled 2011-11-08: qty 22

## 2011-11-08 MED ORDER — ROCURONIUM BROMIDE 100 MG/10ML IV SOLN
INTRAVENOUS | Status: DC | PRN
  Administered 2011-11-08: 25 mg via INTRAVENOUS

## 2011-11-08 MED ORDER — METOPROLOL SUCCINATE ER 25 MG PO TB24
25.0000 mg | ORAL_TABLET | Freq: Every day | ORAL | Status: DC
  Administered 2011-11-08 – 2011-11-09 (×2): 25 mg via ORAL
  Filled 2011-11-08 (×2): qty 1

## 2011-11-08 MED ORDER — METHOCARBAMOL 100 MG/ML IJ SOLN
500.0000 mg | Freq: Four times a day (QID) | INTRAVENOUS | Status: DC | PRN
  Administered 2011-11-08 – 2011-11-09 (×3): 500 mg via INTRAVENOUS
  Filled 2011-11-08 (×3): qty 5

## 2011-11-08 MED ORDER — ACETAMINOPHEN 10 MG/ML IV SOLN
INTRAVENOUS | Status: AC
  Filled 2011-11-08: qty 100

## 2011-11-08 MED ORDER — EPINEPHRINE HCL 1 MG/ML IJ SOLN
INTRAMUSCULAR | Status: AC
  Filled 2011-11-08: qty 1

## 2011-11-08 MED ORDER — METOCLOPRAMIDE HCL 5 MG/ML IJ SOLN: 5.0000 mg | Freq: Three times a day (TID) | INTRAMUSCULAR | Status: DC | PRN

## 2011-11-08 MED ORDER — TERAZOSIN HCL 5 MG PO CAPS
5.0000 mg | ORAL_CAPSULE | Freq: Every day | ORAL | Status: DC
  Administered 2011-11-08: 5 mg via ORAL
  Filled 2011-11-08 (×2): qty 1

## 2011-11-08 MED ORDER — OXYCODONE HCL 5 MG PO TABS: 5.0000 mg | ORAL_TABLET | Freq: Once | ORAL | Status: DC | PRN

## 2011-11-08 MED ORDER — METHOCARBAMOL 500 MG PO TABS
500.0000 mg | ORAL_TABLET | Freq: Four times a day (QID) | ORAL | Status: DC | PRN
  Administered 2011-11-08: 500 mg via ORAL
  Filled 2011-11-08: qty 1

## 2011-11-08 MED ORDER — ONDANSETRON HCL 4 MG/2ML IJ SOLN
INTRAMUSCULAR | Status: DC | PRN
  Administered 2011-11-08: 4 mg via INTRAVENOUS

## 2011-11-08 MED ORDER — DUTASTERIDE-TAMSULOSIN HCL 0.5-0.4 MG PO CAPS: 1.0000 | ORAL_CAPSULE | ORAL | Status: DC

## 2011-11-08 MED ORDER — MEPERIDINE HCL 50 MG/ML IJ SOLN: 6.2500 mg | INTRAMUSCULAR | Status: DC | PRN

## 2011-11-08 MED ORDER — METOPROLOL SUCCINATE ER 25 MG PO TB24
25.0000 mg | ORAL_TABLET | ORAL | Status: AC
  Administered 2011-11-08: 25 mg via ORAL
  Filled 2011-11-08: qty 1

## 2011-11-08 MED ORDER — MIDAZOLAM HCL 2 MG/2ML IJ SOLN
INTRAMUSCULAR | Status: AC
  Filled 2011-11-08: qty 2

## 2011-11-08 MED ORDER — DOCUSATE SODIUM 100 MG PO CAPS
100.0000 mg | ORAL_CAPSULE | Freq: Two times a day (BID) | ORAL | Status: DC
  Administered 2011-11-08 – 2011-11-09 (×2): 100 mg via ORAL

## 2011-11-08 MED ORDER — ONDANSETRON HCL 4 MG PO TABS: 4.0000 mg | ORAL_TABLET | Freq: Four times a day (QID) | ORAL | Status: DC | PRN

## 2011-11-08 MED ORDER — STERILE WATER FOR IRRIGATION IR SOLN
Status: DC | PRN
  Administered 2011-11-08: 500 mL

## 2011-11-08 MED ORDER — HYDROMORPHONE HCL PF 1 MG/ML IJ SOLN
INTRAMUSCULAR | Status: AC
  Filled 2011-11-08: qty 1

## 2011-11-08 MED ORDER — ASPIRIN EC 81 MG PO TBEC
81.0000 mg | DELAYED_RELEASE_TABLET | Freq: Every day | ORAL | Status: DC
  Administered 2011-11-09: 81 mg via ORAL
  Filled 2011-11-08: qty 1

## 2011-11-08 MED ORDER — VITAMIN E 180 MG (400 UNIT) PO CAPS
400.0000 [IU] | ORAL_CAPSULE | Freq: Every day | ORAL | Status: DC
  Administered 2011-11-09: 400 [IU] via ORAL
  Filled 2011-11-08: qty 1

## 2011-11-08 MED ORDER — KETOROLAC TROMETHAMINE 15 MG/ML IJ SOLN
7.5000 mg | Freq: Four times a day (QID) | INTRAMUSCULAR | Status: DC
  Administered 2011-11-08 – 2011-11-09 (×6): 7.5 mg via INTRAVENOUS
  Filled 2011-11-08 (×9): qty 1

## 2011-11-08 MED ORDER — SUCCINYLCHOLINE CHLORIDE 20 MG/ML IJ SOLN
INTRAMUSCULAR | Status: DC | PRN
  Administered 2011-11-08: 100 mg via INTRAVENOUS

## 2011-11-08 MED ORDER — CEFAZOLIN SODIUM 1-5 GM-% IV SOLN
1.0000 g | Freq: Four times a day (QID) | INTRAVENOUS | Status: AC
  Administered 2011-11-08 – 2011-11-09 (×3): 1 g via INTRAVENOUS
  Filled 2011-11-08 (×3): qty 50

## 2011-11-08 MED ORDER — MORPHINE SULFATE 4 MG/ML IJ SOLN
INTRAMUSCULAR | Status: AC
  Filled 2011-11-08: qty 1

## 2011-11-08 MED ORDER — BUPIVACAINE HCL (PF) 0.25 % IJ SOLN
INTRAMUSCULAR | Status: AC
  Filled 2011-11-08: qty 30

## 2011-11-08 MED ORDER — LACTATED RINGERS IV SOLN
INTRAVENOUS | Status: DC | PRN
  Administered 2011-11-08: 10:00:00 via INTRAVENOUS

## 2011-11-08 MED ORDER — HYDROMORPHONE HCL PF 1 MG/ML IJ SOLN
0.2500 mg | INTRAMUSCULAR | Status: DC | PRN
  Administered 2011-11-08 (×4): 0.5 mg via INTRAVENOUS

## 2011-11-08 MED ORDER — HYDROCODONE-ACETAMINOPHEN 5-325 MG PO TABS
1.0000 | ORAL_TABLET | ORAL | Status: DC | PRN
  Administered 2011-11-08 (×2): 2 via ORAL
  Administered 2011-11-09 (×2): 1 via ORAL
  Filled 2011-11-08 (×5): qty 2

## 2011-11-08 MED ORDER — BUPIVACAINE-EPINEPHRINE (PF) 0.25% -1:200000 IJ SOLN
INTRAMUSCULAR | Status: DC | PRN
  Administered 2011-11-08: 30 mL

## 2011-11-08 MED ORDER — SODIUM CHLORIDE 0.9 % IV SOLN
INTRAVENOUS | Status: DC
  Administered 2011-11-08: 1000 mL via INTRAVENOUS

## 2011-11-08 MED ORDER — LACTATED RINGERS IR SOLN
Status: DC | PRN
  Administered 2011-11-08: 6000 mL

## 2011-11-08 MED ORDER — PROPOFOL 10 MG/ML IV EMUL
INTRAVENOUS | Status: DC | PRN
  Administered 2011-11-08: 150 mg via INTRAVENOUS

## 2011-11-08 MED ORDER — METOCLOPRAMIDE HCL 10 MG PO TABS: 5.0000 mg | ORAL_TABLET | Freq: Three times a day (TID) | ORAL | Status: DC | PRN

## 2011-11-08 MED ORDER — OXYCODONE HCL 5 MG PO TABS
5.0000 mg | ORAL_TABLET | ORAL | Status: DC | PRN
  Administered 2011-11-09 (×2): 10 mg via ORAL
  Filled 2011-11-08: qty 1
  Filled 2011-11-08 (×2): qty 2

## 2011-11-08 MED ORDER — PROMETHAZINE HCL 25 MG/ML IJ SOLN: 6.2500 mg | INTRAMUSCULAR | Status: DC | PRN

## 2011-11-08 SURGICAL SUPPLY — 45 items
BLADE 4.2CUDA (BLADE) ×2 IMPLANT
BLADE SURG SZ11 CARB STEEL (BLADE) ×2 IMPLANT
BOOTIES KNEE HIGH SLOAN (MISCELLANEOUS) IMPLANT
BUR OVAL 4.0 (BURR) ×2 IMPLANT
BUR VERTEX HOODED 4.5 (BURR) ×2 IMPLANT
CANNULA 5.75X7 CRYSTAL CLEAR (CANNULA) ×2 IMPLANT
CANNULA SHOULDER 7CM (CANNULA) ×2 IMPLANT
CANNULA TWIST IN 8.25X7CM (CANNULA) IMPLANT
CLOTH BEACON ORANGE TIMEOUT ST (SAFETY) ×2 IMPLANT
DRAPE INCISE IOBAN 66X45 STRL (DRAPES) IMPLANT
DRAPE SHOULDER BEACH CHAIR (DRAPES) ×2 IMPLANT
DRAPE U-SHAPE 47X51 STRL (DRAPES) ×2 IMPLANT
DRSG PAD ABDOMINAL 8X10 ST (GAUZE/BANDAGES/DRESSINGS) IMPLANT
DURAPREP 26ML APPLICATOR (WOUND CARE) ×2 IMPLANT
GAUZE XEROFORM 1X8 LF (GAUZE/BANDAGES/DRESSINGS) ×2 IMPLANT
GLOVE BIO SURGEON STRL SZ7.5 (GLOVE) ×2 IMPLANT
GLOVE BIO SURGEON STRL SZ8 (GLOVE) ×4 IMPLANT
GLOVE BIOGEL PI IND STRL 8 (GLOVE) ×1 IMPLANT
GLOVE BIOGEL PI INDICATOR 8 (GLOVE) ×1
GLOVE ECLIPSE 8.0 STRL XLNG CF (GLOVE) ×2 IMPLANT
GLOVE ORTHO TXT STRL SZ7.5 (GLOVE) ×2 IMPLANT
GOWN PREVENTION PLUS LG XLONG (DISPOSABLE) IMPLANT
GOWN PREVENTION PLUS XLARGE (GOWN DISPOSABLE) ×2 IMPLANT
GOWN STRL NON-REIN LRG LVL3 (GOWN DISPOSABLE) IMPLANT
GOWN STRL REIN XL XLG (GOWN DISPOSABLE) ×4 IMPLANT
IV LACTATED RINGER IRRG 3000ML (IV SOLUTION) ×2
IV LR IRRIG 3000ML ARTHROMATIC (IV SOLUTION) ×2 IMPLANT
MANIFOLD NEPTUNE II (INSTRUMENTS) ×2 IMPLANT
NEEDLE HYPO 25X1 1.5 SAFETY (NEEDLE) IMPLANT
NEEDLE SCORPION (NEEDLE) ×2 IMPLANT
NEEDLE SPNL 18GX3.5 QUINCKE PK (NEEDLE) ×2 IMPLANT
PACK SHOULDER CUSTOM OPM052 (CUSTOM PROCEDURE TRAY) ×2 IMPLANT
POSITIONER SURGICAL ARM (MISCELLANEOUS) ×2 IMPLANT
SET ARTHROSCOPY TUBING (MISCELLANEOUS) ×1
SET ARTHROSCOPY TUBING LN (MISCELLANEOUS) ×1 IMPLANT
SLING ARM IMMOBILIZER LRG (SOFTGOODS) ×2 IMPLANT
SPONGE GAUZE 4X4 12PLY (GAUZE/BANDAGES/DRESSINGS) IMPLANT
STAPLER VISISTAT 35W (STAPLE) ×2 IMPLANT
STRIP CLOSURE SKIN 1/2X4 (GAUZE/BANDAGES/DRESSINGS) IMPLANT
SUT ETHILON 3 0 PS 1 (SUTURE) ×2 IMPLANT
SYR CONTROL 10ML LL (SYRINGE) IMPLANT
TAPE CLOTH SURG 6X10 WHT LF (GAUZE/BANDAGES/DRESSINGS) ×2 IMPLANT
TOWEL OR 17X26 10 PK STRL BLUE (TOWEL DISPOSABLE) ×2 IMPLANT
TUBING CONNECTING 10 (TUBING) ×2 IMPLANT
WAND 90 DEG TURBOVAC W/CORD (SURGICAL WAND) ×4 IMPLANT

## 2011-11-08 NOTE — Preoperative (Signed)
Beta Blockers   Reason not to administer Beta Blockers:Not Applicable Pt took Beta Blocker 11-08-11 AM

## 2011-11-08 NOTE — Brief Op Note (Signed)
11/08/2011  11:28 AM  PATIENT:  Skip Estimable  78 y.o. male  PRE-OPERATIVE DIAGNOSIS:  Right shoulder impingement syndrome, partial rotator cuff tear  POST-OPERATIVE DIAGNOSIS:  Right shoulder impingement syndrome, partial rotator cuff  PROCEDURE:  Procedure(s) (LRB): SHOULDER ARTHROSCOPY WITH SUBACROMIAL DECOMPRESSION (Right)  SURGEON:  Surgeon(s) and Role:    * Kathryne Hitch, MD - Primary  PHYSICIAN ASSISTANT:   ASSISTANTS: none   ANESTHESIA:   local and general  EBL:     BLOOD ADMINISTERED:none  DRAINS: none   LOCAL MEDICATIONS USED:  MARCAINE     SPECIMEN:  No Specimen  DISPOSITION OF SPECIMEN:  N/A  COUNTS:  YES  TOURNIQUET:  * No tourniquets in log *  DICTATION: .Other Dictation: Dictation Number [id number]  PLAN OF CARE: Admit for overnight observation  PATIENT DISPOSITION:  PACU - hemodynamically stable.   Delay start of Pharmacological VTE agent (>24hrs) due to surgical blood loss or risk of bleeding: not applicable

## 2011-11-08 NOTE — Anesthesia Preprocedure Evaluation (Addendum)
Anesthesia Evaluation  Patient identified by MRN, date of birth, ID band Patient awake    Reviewed: Allergy & Precautions, H&P , NPO status , Patient's Chart, lab work & pertinent test results  Airway Mallampati: III TM Distance: >3 FB Neck ROM: Full    Dental  (+) Edentulous Upper and Poor Dentition,    Pulmonary  breath sounds clear to auscultation  Pulmonary exam normal       Cardiovascular hypertension, Pt. on medications and Pt. on home beta blockers + CAD and + Past MI + dysrhythmias Rhythm:Regular Rate:Normal  Bifascicular block on ECG   Neuro/Psych PSYCHIATRIC DISORDERS Minor L sided residual weakness CVA, Residual Symptoms    GI/Hepatic negative GI ROS, Neg liver ROS,   Endo/Other    Renal/GU negative Renal ROS     Musculoskeletal negative musculoskeletal ROS (+)   Abdominal (+)  Abdomen: soft.    Peds  Hematology   Anesthesia Other Findings   Reproductive/Obstetrics                         Anesthesia Physical Anesthesia Plan  ASA: III  Anesthesia Plan: General and Regional   Post-op Pain Management:    Induction: Intravenous  Airway Management Planned: Oral ETT  Additional Equipment:   Intra-op Plan:   Post-operative Plan: Extubation in OR  Informed Consent: I have reviewed the patients History and Physical, chart, labs and discussed the procedure including the risks, benefits and alternatives for the proposed anesthesia with the patient or authorized representative who has indicated his/her understanding and acceptance.   Dental advisory given  Plan Discussed with: CRNA and Surgeon  Anesthesia Plan Comments:         Anesthesia Quick Evaluation

## 2011-11-08 NOTE — H&P (Signed)
Glenn Brooks is an 78 y.o. male.   Chief Complaint:   Severe right shoulder pain  HPI:   78 yo male with right shoulder impingement syndrome.  Failed conservative treatment with multiple injections, NSAIDs, exercises and time.  MRI with bursitis, RTC tendenosis, partial-thickness RTC tear, and AC joint arthropathy.  He wishes to proceed with an arthroscopic intervention.  The risks and benefits of surgery have been explained in detail.  Past Medical History  Diagnosis Date  . ASCVD (arteriosclerotic cardiovascular disease) 1993    Status post MI and percutaneous intervention  . PVC (premature ventricular contraction)     No symptoms  . DJD (degenerative joint disease)   . ED (erectile dysfunction)     Secondary to medication  . Hyperlipidemia   . Hyperglycemia   . HTN (hypertension)   . Testosterone deficiency   . Obesity   . Colonic polyp     excised via colonoscope    Past Surgical History  Procedure Date  . Knee surgery     Laparoscopic x 2  . Cataract extraction, bilateral   . Transurethral resection of bladder     Either diagnostic or a    History reviewed. No pertinent family history. Social History:  reports that he has never smoked. He does not have any smokeless tobacco history on file. His alcohol and drug histories not on file.  Allergies: No Known Allergies  Medications Prior to Admission  Medication Sig Dispense Refill  . aspirin 81 MG tablet Take 81 mg by mouth daily.        . celecoxib (CELEBREX) 200 MG capsule Take 200 mg by mouth 2 (two) times daily.       Marland Kitchen diltiazem (CARDIZEM CD) 180 MG 24 hr capsule Take 1 capsule (180 mg total) by mouth daily.  30 capsule  6  . Dutasteride-Tamsulosin HCl (JALYN) 0.5-0.4 MG CAPS Take 1 capsule by mouth every morning.      . metoprolol (TOPROL-XL) 25 MG 24 hr tablet Take 1 tablet (25 mg total) by mouth daily.  30 tablet  6  . omeprazole (PRILOSEC) 40 MG capsule Take 40 mg by mouth daily.      Marland Kitchen OVER THE COUNTER  MEDICATION 1 drop 2 (two) times daily. In left twice a day. Over the counter eye drop      . OVER THE COUNTER MEDICATION 2 (two) times daily. Nose drops for dry nose      . terazosin (HYTRIN) 5 MG capsule Take 1 capsule (5 mg total) by mouth at bedtime.  30 capsule  6  . vitamin B-12 (CYANOCOBALAMIN) 1000 MCG tablet Take 1,000 mcg by mouth daily.      . vitamin E (VITAMIN E) 400 UNIT capsule Take 400 Units by mouth daily.      . nitroGLYCERIN (NITROSTAT) 0.4 MG SL tablet Place 1 tablet (0.4 mg total) under the tongue every 5 (five) minutes as needed.  25 tablet  3    Results for orders placed during the hospital encounter of 11/08/11 (from the past 48 hour(s))  BASIC METABOLIC PANEL     Status: Abnormal   Collection Time   11/08/11  8:30 AM      Component Value Range Comment   Sodium 137  135 - 145 mEq/L    Potassium 3.7  3.5 - 5.1 mEq/L    Chloride 102  96 - 112 mEq/L    CO2 24  19 - 32 mEq/L    Glucose, Bld 132 (*)  70 - 99 mg/dL    BUN 13  6 - 23 mg/dL    Creatinine, Ser 4.09  0.50 - 1.35 mg/dL    Calcium 9.3  8.4 - 81.1 mg/dL    GFR calc non Af Amer 78 (*) >90 mL/min    GFR calc Af Amer 90 (*) >90 mL/min   CBC     Status: Abnormal   Collection Time   11/08/11  8:30 AM      Component Value Range Comment   WBC 4.9  4.0 - 10.5 K/uL    RBC 4.02 (*) 4.22 - 5.81 MIL/uL    Hemoglobin 13.3  13.0 - 17.0 g/dL    HCT 91.4 (*) 78.2 - 52.0 %    MCV 94.3  78.0 - 100.0 fL    MCH 33.1  26.0 - 34.0 pg    MCHC 35.1  30.0 - 36.0 g/dL    RDW 95.6  21.3 - 08.6 %    Platelets 167  150 - 400 K/uL    No results found.  Review of Systems  All other systems reviewed and are negative.    Blood pressure 166/74, pulse 85, temperature 97.8 F (36.6 C), temperature source Oral, resp. rate 18, SpO2 100.00%. Physical Exam  Constitutional: He is oriented to person, place, and time. He appears well-developed and well-nourished.  HENT:  Head: Normocephalic and atraumatic.  Eyes: EOM are normal. Pupils  are equal, round, and reactive to light.  Neck: Normal range of motion. Neck supple.  Cardiovascular: Normal rate and regular rhythm.   Respiratory: Effort normal and breath sounds normal.  GI: Soft. Bowel sounds are normal.  Musculoskeletal:       Right shoulder: He exhibits decreased range of motion, bony tenderness and crepitus.  Neurological: He is alert and oriented to person, place, and time.  Skin: Skin is warm and dry.  Psychiatric: He has a normal mood and affect.     Assessment/Plan Severe right shoulder pain and impingement syndrome 1) to the OR for a right shoulder arthroscopy with debridement and subacromial decompression.  BLACKMAN,CHRISTOPHER Y 11/08/2011, 9:52 AM

## 2011-11-08 NOTE — Anesthesia Postprocedure Evaluation (Signed)
Anesthesia Post Note  Patient: Glenn Brooks  Procedure(s) Performed: Procedure(s) (LRB): SHOULDER ARTHROSCOPY WITH SUBACROMIAL DECOMPRESSION (Right)  Anesthesia type: General  Patient location: PACU  Post pain: Pain level controlled  Post assessment: Post-op Vital signs reviewed  Last Vitals: BP 146/70  Pulse 64  Temp 36.4 C (Oral)  Resp 16  SpO2 99%  Post vital signs: Reviewed  Level of consciousness: sedated  Complications: No apparent anesthesia complications

## 2011-11-08 NOTE — Transfer of Care (Signed)
Immediate Anesthesia Transfer of Care Note  Patient: Glenn Brooks  Procedure(s) Performed: Procedure(s) (LRB): SHOULDER ARTHROSCOPY WITH SUBACROMIAL DECOMPRESSION (Right)  Patient Location: PACU  Anesthesia Type: General  Level of Consciousness: sedated, patient cooperative and responds to stimulaton  Airway & Oxygen Therapy: Patient Spontanous Breathing and Patient connected to face mask oxgen  Post-op Assessment: Report given to PACU RN and Post -op Vital signs reviewed and stable  Post vital signs: Reviewed and stable  Complications: No apparent anesthesia complications

## 2011-11-09 MED ORDER — OXYCODONE HCL 5 MG PO TABS
5.0000 mg | ORAL_TABLET | ORAL | Status: DC | PRN
  Administered 2011-11-09: 15 mg via ORAL
  Filled 2011-11-09: qty 3

## 2011-11-09 MED ORDER — OXYCODONE-ACETAMINOPHEN 5-325 MG PO TABS: 1.0000 | ORAL_TABLET | ORAL | Status: AC | PRN

## 2011-11-09 NOTE — Op Note (Signed)
Glenn Brooks, Glenn Brooks               ACCOUNT NO.:  [id number]  MEDICAL RECORD NO.:  [id number]  LOCATION:  1612                         FACILITY:  [facility]  PHYSICIAN:  Vanita Panda. Magnus Ivan, M.D.DATE OF BIRTH:  14-Jan-1930  DATE OF PROCEDURE:  11/08/2011 DATE OF DISCHARGE:                              OPERATIVE REPORT   PREOPERATIVE DIAGNOSIS:  Severe right shoulder impingement syndrome with rotator cuff tendinosis, partial thickness, rotator cuff tear, acromioclavicular joint arthropathy.  POSTOPERATIVE DIAGNOSIS:  Severe right shoulder impingement syndrome with rotator cuff tendinosis, partial thickness, rotator cuff tear, acromioclavicular joint arthropathy.  PROCEDURE:  Right shoulder arthroscopy with extensive debridement and subacromial decompression.  SURGEON:  Vanita Panda. Magnus Ivan, MD  ANESTHESIA: 1. General 2. Local.  BLOOD LOSS:  Minimal.  COMPLICATIONS:  None.  INDICATIONS:  Glenn Brooks is a very active and muscular 78 year old gentleman with severe right shoulder pain, this had been worsening for sometime now.  We provided steroid injections in the shoulder. Eventually, an MRI was obtained, which showed significant rotator cuff tendinosis and loose articular surface tearing.  He had significant AC joint arthropathy.  His activities continued to be limited with overhead activity and reaching behind him and his pain has improvement on the steroid injections.  He does so much work with this shoulder and he started to lose sleep.  He wishes to proceed with an arthroscopic intervention given the failure of conservative treatment.  The risks and benefits of surgery were explained in detail and he does wish to proceed.  PROCEDURE DESCRIPTION:  After informed consent was obtained, the right shoulder was marked, he was brought to the operating room, placed upon the operating table.  General anesthesia was obtained.  We then fashioned in a beach-chair position with  appropriate positioning of the head and neck and padding down on operative left arm.  His right arm was then prepped and draped with DuraPrep and sterile drapes and was placed in an in-line skeletal traction at 45 degrees of forward flexion and 10 pounds of traction.  There was neutral rotation applied as well.  A time- out was called to identify as correct patient, correct right shoulder. I then made a posterolateral arthroscopy portal and entered the glenohumeral joint.  I did find undersurface/articular surface tearing of the rotator cuff which was not full thickness.  I found significant inflamed tissue especially the anterior capsule and degenerative labral tearing as well.  Anterior portal was made in the rotator interval and I performed a significant debridement in the subacromial space.  I did find an area of full-thickness cartilage loss at the humeral head as well.  Once I debrided the undersurface of the rotator cuff, significant inflamed synovium joint capsule anteriorly and superiorly.  I then entered subacromial space through the posterior portal and found significant bursitis and tendinosis of the rotator cuff.  There was a lot of tissue around the [facility] joint with significant arthritic changes in the [facility] joint, and a hooked acromion as well.  Making a lateral portal, I then performed a significant subacromial decompression, debridement of subacromial space, removing bursa, debrided the tendon down to stable margin with still saying that it was intact.  I used  a high-speed bur to cope underneath the clavicle as well as perform minimal distal clavicle resection, I could see the distal clavicle was then not touching the acromion.  I debrided and inflamed tissue throughout and specially bur and the bone spurs at the front of the acromion as well.  There was a significant space left in the shoulder following decompression.  I then removed all instrumentation and closed 3 portal  sites with interrupted 3- 0 nylon suture.  Xeroform followed by well-padded sterile dressing was applied.  The patient's shoulder was placed in a sling.  He was awakened, extubated, and taken to recovery room in stable condition. All final counts were correct.  No complications noted. Postoperatively, we will keep him overnight in the hospital since he lives alone and drove down here on his own from [place].     Vanita Panda. Magnus Ivan, M.D.     CYB/MEDQ  D:  11/08/2011  T:  11/09/2011  Job:  [id number]

## 2011-11-09 NOTE — Discharge Summary (Signed)
Patient ID: Glenn Brooks MRN: [id number] DOB/AGE: 07-17-1929 78 y.o.  Admit date: 11/08/2011 Discharge date: 11/09/2011  Admission Diagnoses:  Principal Problem:  *Shoulder impingement syndrome   Discharge Diagnoses:  Same  Past Medical History  Diagnosis Date  . ASCVD (arteriosclerotic cardiovascular disease) 1993    Status post MI and percutaneous intervention  . PVC (premature ventricular contraction)     No symptoms  . DJD (degenerative joint disease)   . ED (erectile dysfunction)     Secondary to medication  . Hyperlipidemia   . Hyperglycemia   . HTN (hypertension)   . Testosterone deficiency   . Obesity   . Colonic polyp     excised via colonoscope    Surgeries: Procedure(s): SHOULDER ARTHROSCOPY WITH SUBACROMIAL DECOMPRESSION on 11/08/2011   Consultants:    Discharged Condition: Improved  Hospital Course: Glenn Brooks is an 78 y.o. male who was admitted 11/08/2011 for operative treatment ofShoulder impingement syndrome. Patient has severe unremitting pain that affects sleep, daily activities, and work/hobbies. After pre-op clearance the patient was taken to the operating room on 11/08/2011 and underwent  Procedure(s): SHOULDER ARTHROSCOPY WITH SUBACROMIAL DECOMPRESSION.    Patient was given perioperative antibiotics: Anti-infectives     Start     Dose/Rate Route Frequency Ordered Stop   11/08/11 1600   ceFAZolin (ANCEF) IVPB 1 g/50 mL premix        1 g 100 mL/hr over 30 Minutes Intravenous Every 6 hours 11/08/11 1421 11/09/11 0338   11/08/11 0747   ceFAZolin (ANCEF) IVPB 2 g/50 mL premix        2 g 100 mL/hr over 30 Minutes Intravenous 60 min pre-op 11/08/11 0747 11/08/11 1008           Patient was given sequential compression devices, early ambulation, and chemoprophylaxis to prevent DVT.  Patient benefited maximally from hospital stay and there were no complications.    Recent vital signs: Patient Vitals for the past 24 hrs:  BP Temp Temp src  Pulse Resp SpO2 Height Weight  11/09/11 0608 154/83 mmHg 98.1 F (36.7 C) - 83  18  92 % - -  11/09/11 0148 127/73 mmHg 98 F (36.7 C) - 84  16  91 % - -  11/08/11 2100 159/82 mmHg 98 F (36.7 C) - 82  18  97 % - -  11/08/11 1710 167/90 mmHg 97.6 F (36.4 C) Oral 82  16  100 % - -  11/08/11 1610 167/88 mmHg 97.8 F (36.6 C) Oral 78  16  100 % - -  11/08/11 1510 159/86 mmHg 97.6 F (36.4 C) Oral 88  16  97 % - -  11/08/11 1410 178/90 mmHg 97.7 F (36.5 C) Oral 75  16  100 % 5\' 7"  (1.702 m) 79.379 kg (175 lb)  11/08/11 1345 162/77 mmHg 97.9 F (36.6 C) - 75  17  100 % - -  11/08/11 1330 150/89 mmHg - - 73  18  100 % - -  11/08/11 1315 146/76 mmHg - - 79  18  100 % - -  11/08/11 1300 153/69 mmHg 97.6 F (36.4 C) - 67  17  100 % - -  11/08/11 1245 152/72 mmHg - - 66  14  100 % - -  11/08/11 1230 146/70 mmHg 97.6 F (36.4 C) - 64  16  99 % - -  11/08/11 1215 142/61 mmHg - - 62  13  100 % - -  11/08/11 1200 147/62 mmHg - -  59  16  100 % - -  11/08/11 1145 144/60 mmHg - - 66  22  100 % - -  11/08/11 1136 155/67 mmHg 97.6 F (36.4 C) - 67  - 100 % - -  11/08/11 0752 166/74 mmHg 97.8 F (36.6 C) Oral 85  18  100 % - -     Recent laboratory studies:  Basename 11/08/11 0830  WBC 4.9  HGB 13.3  HCT 37.9*  PLT 167  NA 137  K 3.7  CL 102  CO2 24  BUN 13  CREATININE 0.92  GLUCOSE 132*  INR --  CALCIUM 9.3     Discharge Medications:   Medication List  As of 11/09/2011  7:03 AM   TAKE these medications         aspirin 81 MG tablet   Take 81 mg by mouth daily.      celecoxib 200 MG capsule   Commonly known as: CELEBREX   Take 200 mg by mouth 2 (two) times daily.      diltiazem 180 MG 24 hr capsule   Commonly known as: CARDIZEM CD   Take 1 capsule (180 mg total) by mouth daily.      JALYN 0.5-0.4 MG Caps   Generic drug: Dutasteride-Tamsulosin HCl   Take 1 capsule by mouth every morning.      metoprolol succinate 25 MG 24 hr tablet   Commonly known as: TOPROL-XL    Take 1 tablet (25 mg total) by mouth daily.      nitroGLYCERIN 0.4 MG SL tablet   Commonly known as: NITROSTAT   Place 1 tablet (0.4 mg total) under the tongue every 5 (five) minutes as needed.      omeprazole 40 MG capsule   Commonly known as: PRILOSEC   Take 40 mg by mouth daily.      OVER THE COUNTER MEDICATION   1 drop 2 (two) times daily. In left twice a day. Over the counter eye drop      OVER THE COUNTER MEDICATION   2 (two) times daily. Nose drops for dry nose      oxyCODONE-acetaminophen 5-325 MG per tablet   Commonly known as: PERCOCET/ROXICET   Take 1-2 tablets by mouth every 4 (four) hours as needed for pain.      terazosin 5 MG capsule   Commonly known as: HYTRIN   Take 1 capsule (5 mg total) by mouth at bedtime.      vitamin B-12 1000 MCG tablet   Commonly known as: CYANOCOBALAMIN   Take 1,000 mcg by mouth daily.      vitamin E 400 UNIT capsule   Generic drug: vitamin E   Take 400 Units by mouth daily.            Diagnostic Studies: No results found.  Disposition: 01-Home or Self Care  Discharge Orders    Future Orders Please Complete By Expires   Diet - low sodium heart healthy      Call MD / Call 911      Comments:   If you experience chest pain or shortness of breath, CALL 911 and be transported to the hospital emergency room.  If you develope a fever above 101 F, pus (white drainage) or increased drainage or redness at the wound, or calf pain, call your surgeon's office.   Constipation Prevention      Comments:   Drink plenty of fluids.  Prune juice may be helpful.  You may use  a stool softener, such as Colace (over the counter) 100 mg twice a day.  Use MiraLax (over the counter) for constipation as needed.   Increase activity slowly as tolerated      Discharge instructions      Comments:   Increase your activities as comfort allows. Come out of your sling as comfort allows. You can get your shoulder and incisions wet in the shower; place  band-aids daily over your incisions. Follow-up at [facility] with Dr. Magnus Ivan in 1 week [phone])   Discharge patient            Signed: Kathryne Hitch 11/09/2011, 7:03 AM

## 2011-11-09 NOTE — Progress Notes (Signed)
MD discharging patient home. RN confirmed a verbal order from the MD that patient is allowed to drive himself home this afternoon 8.16.13 , the day after surgery.

## 2011-11-09 NOTE — Progress Notes (Signed)
Patient ID: Glenn Brooks, male   DOB: Jul 28, 1929, 78 y.o.   MRN: [id number] Looks good overall post right shoulder scope.  NVI.  Incisions C/D/I.  Ok to D/C to home today.

## 2011-11-22 ENCOUNTER — Other Ambulatory Visit: Payer: Self-pay | Admitting: Adult Health

## 2011-11-27 ENCOUNTER — Encounter (HOSPITAL_COMMUNITY): Payer: Self-pay

## 2011-11-27 ENCOUNTER — Emergency Department (HOSPITAL_COMMUNITY)
Admission: EM | Admit: 2011-11-27 | Discharge: 2011-11-27 | Disposition: A | Discharge status: Home / Self Care | Payer: PRIVATE HEALTH INSURANCE | Attending: Emergency Medicine | Admitting: Emergency Medicine

## 2011-11-27 DIAGNOSIS — Z79899 Other long term (current) drug therapy: Secondary | ICD-10-CM | POA: Insufficient documentation

## 2011-11-27 DIAGNOSIS — I10 Essential (primary) hypertension: Secondary | ICD-10-CM | POA: Insufficient documentation

## 2011-11-27 DIAGNOSIS — M25519 Pain in unspecified shoulder: Secondary | ICD-10-CM | POA: Insufficient documentation

## 2011-11-27 DIAGNOSIS — Z7982 Long term (current) use of aspirin: Secondary | ICD-10-CM | POA: Insufficient documentation

## 2011-11-27 DIAGNOSIS — E785 Hyperlipidemia, unspecified: Secondary | ICD-10-CM | POA: Insufficient documentation

## 2011-11-27 DIAGNOSIS — M25511 Pain in right shoulder: Secondary | ICD-10-CM

## 2011-11-27 DIAGNOSIS — I2510 Atherosclerotic heart disease of native coronary artery without angina pectoris: Secondary | ICD-10-CM | POA: Insufficient documentation

## 2011-11-27 DIAGNOSIS — E669 Obesity, unspecified: Secondary | ICD-10-CM | POA: Insufficient documentation

## 2011-11-27 MED ORDER — OXYCODONE-ACETAMINOPHEN 5-325 MG PO TABS: 1.0000 | ORAL_TABLET | Freq: Four times a day (QID) | ORAL | Status: AC | PRN

## 2011-11-27 MED ORDER — OXYCODONE-ACETAMINOPHEN 5-325 MG PO TABS: 2.0000 | ORAL_TABLET | ORAL | Status: AC | PRN

## 2011-11-27 NOTE — ED Provider Notes (Signed)
History     CSN: [id number]  Arrival date & time 11/27/11  4098   First MD Initiated Contact with Patient 11/27/11 1905      Chief Complaint  Patient presents with  . Arm Pain  . Follow-up    (Consider location/radiation/quality/duration/timing/severity/associated sxs/prior treatment) Patient is a 78 y.o. male presenting with arm pain. The history is provided by the patient.  Arm Pain Pertinent negatives include no chest pain, no abdominal pain, no headaches and no shortness of breath.  patient is an 78 year old gentleman primary care Dr. Is Dr. Delbert Harness. Status post right shoulder arthroscopy done by [facility] orthopedics on August 15. Patient last saw them one week ago when he had his sutures removed. Patient continues to have pain in the right shoulder increased pain with range of motion. Ran out of his Percocet a few days ago. Patient tried to get back in to see [place] orthopedics today but was not successful. Denies fevers no chest pain no belly pain no shortness of breath. With movement patient states the pain is 10 out of 10.  Past Medical History  Diagnosis Date  . ASCVD (arteriosclerotic cardiovascular disease) 1993    Status post MI and percutaneous intervention  . PVC (premature ventricular contraction)     No symptoms  . DJD (degenerative joint disease)   . ED (erectile dysfunction)     Secondary to medication  . Hyperlipidemia   . Hyperglycemia   . HTN (hypertension)   . Testosterone deficiency   . Obesity   . Colonic polyp     excised via colonoscope    Past Surgical History  Procedure Date  . Knee surgery     Laparoscopic x 2  . Cataract extraction, bilateral   . Transurethral resection of bladder     Either diagnostic or a  . Shoulder surgery     No family history on file.  History  Substance Use Topics  . Smoking status: Never Smoker   . Smokeless tobacco: Not on file  . Alcohol Use: No      Review of Systems  Constitutional: Negative for  fever.  HENT: Negative for neck pain.   Eyes: Negative for visual disturbance.  Respiratory: Negative for shortness of breath.   Cardiovascular: Negative for chest pain.  Gastrointestinal: Negative for nausea, vomiting and abdominal pain.  Genitourinary: Positive for dysuria.  Musculoskeletal: Negative for back pain.  Skin: Negative for rash.  Neurological: Negative for headaches.    Allergies  Review of patient's allergies indicates no known allergies.  Home Medications   Current Outpatient Rx  Name Route Sig Dispense Refill  . ASPIRIN 81 MG PO TABS Oral Take 81 mg by mouth daily.      Marland Kitchen METOPROLOL SUCCINATE ER 25 MG PO TB24 Oral Take 25 mg by mouth daily.    Marland Kitchen OVER THE COUNTER MEDICATION  1 drop 2 (two) times daily. In left twice a day. Over the counter eye drop    . OVER THE COUNTER MEDICATION  2 (two) times daily. Nose drops for dry nose    . VITAMIN B-12 1000 MCG PO TABS Oral Take 1,000 mcg by mouth daily.    Marland Kitchen VITAMIN E 400 UNITS PO CAPS Oral Take 400 Units by mouth daily.    . CELECOXIB 200 MG PO CAPS Oral Take 200 mg by mouth 2 (two) times daily.     Marland Kitchen DILTIAZEM HCL ER COATED BEADS 180 MG PO CP24 Oral Take 1 capsule (180 mg total)  by mouth daily. 30 capsule 6  . DUTASTERIDE-TAMSULOSIN HCL 0.5-0.4 MG PO CAPS Oral Take 1 capsule by mouth every morning.    Marland Kitchen NITROGLYCERIN 0.4 MG SL SUBL Sublingual Place 1 tablet (0.4 mg total) under the tongue every 5 (five) minutes as needed. 25 tablet 3  . OXYCODONE-ACETAMINOPHEN 5-325 MG PO TABS Oral Take 2 tablets by mouth every 4 (four) hours as needed for pain. 6 tablet 0  . OXYCODONE-ACETAMINOPHEN 5-325 MG PO TABS Oral Take 1-2 tablets by mouth every 6 (six) hours as needed for pain. 15 tablet 0  . TERAZOSIN HCL 5 MG PO CAPS Oral Take 1 capsule (5 mg total) by mouth at bedtime. 30 capsule 6    BP 167/75  Pulse 94  Temp 98.1 F (36.7 C) (Oral)  Resp 16  Ht 5\' 7"  (1.702 m)  Wt 180 lb (81.647 kg)  BMI 28.19 kg/m2  SpO2  98%  Physical Exam  Nursing note and vitals reviewed. Constitutional: He is oriented to person, place, and time. He appears well-developed and well-nourished.  HENT:  Head: Normocephalic.  Mouth/Throat: Oropharynx is clear and moist.  Eyes: Conjunctivae are normal. Pupils are equal, round, and reactive to light.  Neck: Normal range of motion. Neck supple.  Cardiovascular: Normal rate, regular rhythm and normal heart sounds.   Pulmonary/Chest: Effort normal and breath sounds normal.  Abdominal: Soft. Bowel sounds are normal. There is no tenderness.  Musculoskeletal: He exhibits no edema and no tenderness.       No shoulder deformity pain with the any motion in the right shoulder. No significant swelling good range of motion at the elbow at the wrist and fingers. Radial pulse is 2+ Refill the fingers is 1 second. Sensations intact.  Neurological: He is alert and oriented to person, place, and time. No cranial nerve deficit. He exhibits normal muscle tone. Coordination normal.       Except for some numbness to the left hand. That has been present for a long time.  Skin: Skin is warm. No rash noted.    ED Course  Procedures (including critical care time)  Labs Reviewed - No data to display No results found.   1. Shoulder pain, right       MDM  Patient followed by [facility] orthopedics on August 15 had arthroscopy procedure done to his right shoulder. Since that time he said persistent pain is running out of his Percocet. Patient saw been back to see the orthopedic doctor last time was about a week ago had sutures removed. Shoulder without any evidence of infection good distal radial pulse good movement of his fingers. Decreased movement of the shoulder due to pain. We'll treat with Percocet here and given a sling again and have him follow back up with orthopedics.        Shelda Jakes, MD 11/27/11 2011

## 2011-11-27 NOTE — ED Notes (Signed)
Had right shoulder surgery a couple of weeks ago. Having pain in that shoulder that keeps getting worse and worse. Can't lift my arm up very far per pt.

## 2011-11-30 ENCOUNTER — Other Ambulatory Visit: Payer: Self-pay | Admitting: Adult Health

## 2011-12-04 MED FILL — Oxycodone w/ Acetaminophen Tab 5-325 MG: ORAL | Qty: 6 | Status: AC

## 2011-12-10 ENCOUNTER — Ambulatory Visit (HOSPITAL_COMMUNITY)
Admission: RE | Admit: 2011-12-10 | Discharge: 2011-12-10 | Disposition: A | Discharge status: Home / Self Care | Payer: PRIVATE HEALTH INSURANCE | Source: Ambulatory Visit | Attending: Orthopaedic Surgery | Admitting: Orthopaedic Surgery

## 2011-12-10 DIAGNOSIS — M7540 Impingement syndrome of unspecified shoulder: Secondary | ICD-10-CM

## 2011-12-10 DIAGNOSIS — M6281 Muscle weakness (generalized): Secondary | ICD-10-CM | POA: Insufficient documentation

## 2011-12-10 DIAGNOSIS — IMO0001 Reserved for inherently not codable concepts without codable children: Secondary | ICD-10-CM | POA: Insufficient documentation

## 2011-12-10 DIAGNOSIS — M25519 Pain in unspecified shoulder: Secondary | ICD-10-CM

## 2011-12-10 NOTE — Evaluation (Addendum)
Occupational Therapy Evaluation  Patient Details  Name: Glenn Brooks MRN: [id number] Date of Birth: 1930-02-05  Today's Date: 12/10/2011 Time: 1010-1045 OT Time Calculation (min): 35 min OT Evaluation 1010-1020 10' Manual Therapy 1020-1040 20'  Visit#: 1  of 12   Re-eval: 01/07/12  Assessment Diagnosis: S/P R Shoulder Scope, Debridement, SAD Surgical Date: 11/08/11 Next MD Visit: 12/28/11 Prior Therapy: n/a  Authorization: [employer] Medicare Medicaid Secondary requested 3 visits, today is 1 of 3 visits Authorization Time Period: before 10th visit  Authorization Visit#: 1  of 10    Past Medical History:  Past Medical History  Diagnosis Date  . ASCVD (arteriosclerotic cardiovascular disease) 1993    Status post MI and percutaneous intervention  . PVC (premature ventricular contraction)     No symptoms  . DJD (degenerative joint disease)   . ED (erectile dysfunction)     Secondary to medication  . Hyperlipidemia   . Hyperglycemia   . HTN (hypertension)   . Testosterone deficiency   . Obesity   . Colonic polyp     excised via colonoscope   Past Surgical History:  Past Surgical History  Procedure Date  . Knee surgery     Laparoscopic x 2  . Cataract extraction, bilateral   . Transurethral resection of bladder     Either diagnostic or a  . Shoulder surgery     Subjective S:  I had surgery the 15th of last month.  It feels awful all of the time, unless I take pain meds. Pertinent History: Mr. Downs had surgery on 11/08/11 on his right shoulder.  He had been experiencing increased pain and decreased use of his right arm for an extended period of time.  He has been referred to occupational therapy s/p right shoulder scope, SAD, debridement for evaluation and treatment. Limitations: PROM/AAROM, progress to AROM as tolerated and when AAROM is 90%. Special Tests: UEFI 20/80 = 25% I level, 75% disability level. Patient Stated Goals: I want to be at least 80% better  and not have as much pain. Pain Assessment Currently in Pain?: Yes Pain Score:   5 Pain Location: Shoulder Pain Orientation: Right Pain Type: Acute pain  Precautions/Restrictions  Precautions Precautions: None  Prior Functioning  Home Living Lives With: Spouse Prior Function Level of Independence: Independent with basic ADLs;Independent with homemaking with ambulation Driving: Yes Vocation: Retired Leisure: Hobbies-yes (Comment) Comments: yard work and going to [employer] ADL/Vision/Perception ADL ADL Comments: He reports that he can do just about everything that he wants to do, but it is very painful.   Dominant Hand: Right  Cognition/Observation Cognition Overall Cognitive Status: Appears within functional limits for tasks assessed  Sensation/Coordination/Edema Sensation Light Touch: Appears Intact Stereognosis: Not tested Coordination Gross Motor Movements are Fluid and Coordinated: Yes  Additional Assessments RUE AROM (degrees) RUE Overall AROM Comments: assessed in seated.  ER/IR with shoulder adducted Right Shoulder Flexion: 125 Degrees Right Shoulder ABduction: 110 Degrees Right Shoulder Internal Rotation: 85 Degrees Right Shoulder External Rotation: 45 Degrees RUE PROM (degrees) RUE Overall PROM Comments: 75% in supine, limited by pain RUE Strength Right Shoulder Flexion: 4/5 Right Shoulder ABduction: 4/5 Right Shoulder Internal Rotation: 4/5 Right Shoulder External Rotation: 4/5 Palpation Palpation: moderate fascial restrictions noted in his right upper arm, scapular, and pectoral region     Exercise/Treatments    Manual Therapy Manual Therapy: Myofascial release Myofascial Release: MFR and manual stretching to right upper arm, scapular, and pectoral region to decrease pain and restrictions  and increase AAROM and PROM to [facility].  [id number]  Occupational Therapy Assessment and Plan OT Assessment and Plan Clinical Impression Statement: A:   78 year old man s/p  right shoulder scope, debridement, SAD on 11/08/11.  Limitations in strength and AROM due to increased pain and restrictions.  Skilled OT intervention indicated to decrease pain and restrictions and increase AROM and strength.  Pt will benefit from skilled therapeutic intervention in order to improve on the following deficits: Decreased range of motion;Decreased strength;Increased muscle spasms;Increased fascial restricitons;Impaired UE functional use;Pain Rehab Potential: Good OT Frequency: Min 2X/week OT Duration: 6 weeks OT Treatment/Interventions: Self-care/ADL training;Therapeutic exercise;Therapeutic activities;Manual therapy;Modalities;Patient/family education OT Plan: P:  Skilled OT intervention to decrease pain and restrictions and increase pain free AROM and strength in order to return to prior level of function with all B/IADLs.  Treatment Plan:  MFR and manual stretching in supine to decrease pain and restrictions and increase AROM and strength. Therapeutic Exercises:  P/AAROM in supine, progress to AROM when AAROM is WNL.  seated ball stretches, AROM ext, elev, row.  pulleys.  Begin AAROM in seated as tolerated.      Goals Short Term Goals Time to Complete Short Term Goals: 3 weeks Short Term Goal 1: Patient will be educated on a HEP. Short Term Goal 2: Patient will increase PROM in right shoulder to [facility] for increased ability reaching into overhead cabinets. Short Term Goal 3: Patient will increase right shoulder strength to 4+/5 for increased ability to lift tools when working outside. Short Term Goal 4: Patient will decrease pain in his right shoulder to 3/10 when mowing his yard. Short Term Goal 5: Patient will decrease fascial restriction in his right shoulder from moderate to min-mod. Long Term Goals Time to Complete Long Term Goals: 6 weeks Long Term Goal 1: Patient will return to prior level of I with all B/IADLs and leisure activities. Long Term Goal 2:  Patient will increase right shoulder AROM to WNL for increased ability to reach into overhead cabinets. Long Term Goal 3: Patient will increase right shoulder strength to 5/5 for increased ability to lift yard work tools and equipment. Long Term Goal 4: Patient will decrease pain to 2/10 or better when using his right arm as dominant with daily activities. Long Term Goal 5: Patient will decrease fascial restrictions to minimal in his right shoulder region.   Problem List Patient Active Problem List  Diagnosis  . HYPERLIPIDEMIA  . HYPERTENSION  . PREMATURE VENTRICULAR CONTRACTIONS  . ATHEROSCLEROTIC CARDIOVASCULAR DISEASE  . DEGENERATIVE JOINT DISEASE  . HYPERGLYCEMIA  . CONSTIPATION  . Shoulder impingement syndrome  . Pain in joint, shoulder region  . Muscle weakness (generalized)    End of Session Activity Tolerance: Patient tolerated treatment well General Behavior During Session: [facility] for tasks performed Cognition: [facility] for tasks performed OT Plan of Care OT Home Exercise Plan: Educated patient on dowel rod exercises in supine.   GO Functional Assessment Tool Used: UEFI scored 20/80 = 25 % I level and 75% disability.  Clinical observation closer to 50% I level Functional Limitation: Carrying, moving and handling objects Carrying, Moving and Handling Objects Current Status [phone]): At least 40 percent but less than 60 percent impaired, limited or restricted Carrying, Moving and Handling Objects Goal Status [phone]): At least 1 percent but less than 20 percent impaired, limited or restricted  Shirlean Mylar, OTR/L  12/10/2011, 11:43 AM  Physician Documentation Your signature is required to indicate approval of the treatment plan  as stated above.  Please sign and either send electronically or make a copy of this report for your files and return this physician signed original.  Please mark one 1.__approve of plan  2. ___approve of plan with the following  conditions.   ______________________________                                                          _____________________ Physician Signature                                                                                                             Date

## 2011-12-13 ENCOUNTER — Ambulatory Visit (HOSPITAL_COMMUNITY)
Admission: RE | Admit: 2011-12-13 | Discharge: 2011-12-13 | Disposition: A | Discharge status: Home / Self Care | Payer: PRIVATE HEALTH INSURANCE | Source: Ambulatory Visit | Attending: Family Medicine | Admitting: Family Medicine

## 2011-12-13 DIAGNOSIS — M25519 Pain in unspecified shoulder: Secondary | ICD-10-CM

## 2011-12-13 DIAGNOSIS — M6281 Muscle weakness (generalized): Secondary | ICD-10-CM

## 2011-12-13 NOTE — Progress Notes (Signed)
Occupational Therapy Treatment Patient Details  Name: SPURGEON EHLY MRN: [id number] Date of Birth: 1930/01/15  Today's Date: 12/13/2011 Time: 4098-1191 OT Time Calculation (min): 35 min Manual THerpay 478-295 11' Therapeutic Exercises [phone] 24' Visit#: 2  of 12   Re-eval: 01/07/12    Authorization: [employer]   Authorization Time Period: before 10th visit   Authorization Visit#: 2  of 10   Subjective S:  Its ok, I tried a few exercises. Pain Assessment Currently in Pain?: No/denies Pain Score: 0-No pain  Precautions/Restrictions   PROM/AAROM, progress to AROM as tolerated and when AAROM is 90%.   Exercise/Treatments Supine Protraction: PROM;AAROM;10 reps Horizontal ABduction: PROM;AAROM;10 reps External Rotation: PROM;AAROM;10 reps Internal Rotation: PROM;AAROM;10 reps Flexion: PROM;AAROM;10 reps ABduction: PROM;AAROM;10 reps Seated Elevation: AROM;15 reps Extension: AROM;15 reps Row: AROM;15 reps Pulleys Flexion: 1 minute ABduction: 1 minute Therapy Ball Flexion: 15 reps ABduction: 15 reps ROM / Strengthening / Isometric Strengthening Thumb Tacks: 1' Prot/Ret//Elev/Dep: 1'      Manual Therapy Myofascial Release: MFR and manual stretching to right upper arm, scapular, and pectoral region to decrease pain and restrictions and increase AAROM and PROM to [facility]. [phone]  Occupational Therapy Assessment and Plan OT Assessment and Plan Clinical Impression Statement: A:  Significant improvement in PROM to 90% all shoulder movements.  Minimal cuing required to depress scapula during therapeutic exercises. OT Plan: P:  Add AAROM in seated and AROM in supine.  Decrease amount of cuing required for maintaining scapular depression as patient becomes more aware of his positioning.   Goals Short Term Goals Time to Complete Short Term Goals: 3 weeks Short Term Goal 1: Patient will be educated on a HEP. Short Term Goal 1 Progress: Progressing toward  goal Short Term Goal 2: Patient will increase PROM in right shoulder to [facility] for increased ability reaching into overhead cabinets. Short Term Goal 2 Progress: Progressing toward goal Short Term Goal 3: Patient will increase right shoulder strength to 4+/5 for increased ability to lift tools when working outside. Short Term Goal 3 Progress: Progressing toward goal Short Term Goal 4: Patient will decrease pain in his right shoulder to 3/10 when mowing his yard. Short Term Goal 4 Progress: Progressing toward goal Short Term Goal 5: Patient will decrease fascial restriction in his right shoulder from moderate to min-mod. Short Term Goal 5 Progress: Progressing toward goal Long Term Goals Time to Complete Long Term Goals: 6 weeks Long Term Goal 1: Patient will return to prior level of I with all B/IADLs and leisure activities. Long Term Goal 1 Progress: Progressing toward goal Long Term Goal 2: Patient will increase right shoulder AROM to WNL for increased ability to reach into overhead cabinets. Long Term Goal 2 Progress: Progressing toward goal Long Term Goal 3: Patient will increase right shoulder strength to 5/5 for increased ability to lift yard work tools and equipment. Long Term Goal 3 Progress: Progressing toward goal Long Term Goal 4: Patient will decrease pain to 2/10 or better when using his right arm as dominant with daily activities. Long Term Goal 4 Progress: Progressing toward goal Long Term Goal 5: Patient will decrease fascial restrictions to minimal in his right shoulder region.  Long Term Goal 5 Progress: Progressing toward goal  Problem List Patient Active Problem List  Diagnosis  . HYPERLIPIDEMIA  . HYPERTENSION  . PREMATURE VENTRICULAR CONTRACTIONS  . ATHEROSCLEROTIC CARDIOVASCULAR DISEASE  . DEGENERATIVE JOINT DISEASE  . HYPERGLYCEMIA  . CONSTIPATION  . Shoulder impingement syndrome  . Pain  in joint, shoulder region  . Muscle weakness (generalized)    End of  Session Activity Tolerance: Patient tolerated treatment well General Behavior During Session: [facility] for tasks performed Cognition: [facility] for tasks performed  GO    Shirlean Mylar, OTR/L  12/13/2011, 8:48 AM

## 2011-12-18 ENCOUNTER — Other Ambulatory Visit: Payer: Self-pay | Admitting: Adult Health

## 2011-12-18 ENCOUNTER — Ambulatory Visit (HOSPITAL_COMMUNITY)
Admission: RE | Admit: 2011-12-18 | Discharge: 2011-12-18 | Disposition: A | Discharge status: Home / Self Care | Payer: PRIVATE HEALTH INSURANCE | Source: Ambulatory Visit | Attending: Family Medicine | Admitting: Family Medicine

## 2011-12-18 DIAGNOSIS — M6281 Muscle weakness (generalized): Secondary | ICD-10-CM

## 2011-12-18 DIAGNOSIS — M25519 Pain in unspecified shoulder: Secondary | ICD-10-CM

## 2011-12-18 MED ORDER — METOPROLOL SUCCINATE ER 25 MG PO TB24: 25.0000 mg | ORAL_TABLET | Freq: Every day | ORAL | Status: DC

## 2011-12-18 NOTE — Progress Notes (Addendum)
Occupational Therapy Treatment Patient Details  Name: Glenn Brooks MRN: [id number] Date of Birth: September 11, 1929  Today's Date: 12/18/2011 Time: 4098-1191 OT Time Calculation (min): 48 min Manual Therapy 104-129 25' Therapeutic Exercise 130-141 11' Moist Heat 142-152 10'  Visit#: 3  of 12   Re-eval: 01/07/12 Assessment Diagnosis: S/P R Shoulder Scope, Debridement, SAD Surgical Date: 11/08/11 Next MD Visit: 12/28/11  Authorization: Man[employer] Medicare   Authorization Time Period: before 10th visit   Authorization Visit#: 3  of 10   Subjective Symptoms/Limitations Symptoms: S:  It has been hurting all day, I do my exercises when it doesn't hurt. Limitations: PROM/AAROM, progress to AROM as tolerated and when AAROM is 90% Pain Assessment Currently in Pain?: Yes Pain Score:   5 Pain Location: Shoulder Pain Orientation: Right  Precautions/Restrictions  Precautions Precautions: None  Exercise/Treatments Supine Protraction: PROM;AROM;10 reps Horizontal ABduction: PROM;AROM;10 reps External Rotation: PROM;AROM;10 reps Internal Rotation: PROM;AROM;10 reps Flexion: PROM;AROM;10 reps ABduction: PROM;AROM;10 reps Seated Elevation: AROM;15 reps Extension: AROM;15 reps Row: AROM;15 reps Pulleys Flexion: Other (comment) (hold secondary to time) ABduction: Other (comment) (hold secondary to time) Therapy Ball Flexion: 15 reps ABduction: 15 reps ROM / Strengthening / Isometric Strengthening Thumb Tacks: time Prot/Ret//Elev/Dep: time         Modalities Modalities: Moist Heat Manual Therapy Manual Therapy: Myofascial release Myofascial Release: MFR and manual stretching to right upper arm, scapular, and pectoral region to decrease pain and restrictions and increase AAROM and PROM to [facility] Moist Heat Therapy Number Minutes Moist Heat: 10 Minutes Moist Heat Location: Shoulder  Occupational Therapy Assessment and Plan OT Assessment and Plan Clinical Impression  Statement: A: Patient very stiff and painful today, multiple restrictions felt in upperarm and upper trap.Pain decreased to 5/10 after manual 3/10 after exercise and 0 /10 after heat, Some exercises not completed secondary patient received moist heat at end of session and needed to leave to pick up children Rehab Potential: Good OT Plan: P:  Add AAROM in seated and resume ex. missed due to time.`   Goals Short Term Goals Time to Complete Short Term Goals: 3 weeks Short Term Goal 1: Patient will be educated on a HEP. Short Term Goal 2: Patient will increase PROM in right shoulder to [facility] for increased ability reaching into overhead cabinets. Short Term Goal 3: Patient will increase right shoulder strength to 4+/5 for increased ability to lift tools when working outside. Short Term Goal 4: Patient will decrease pain in his right shoulder to 3/10 when mowing his yard. Short Term Goal 5: Patient will decrease fascial restriction in his right shoulder from moderate to min-mod. Long Term Goals Time to Complete Long Term Goals: 6 weeks Long Term Goal 1: Patient will return to prior level of I with all B/IADLs and leisure activities. Long Term Goal 2: Patient will increase right shoulder AROM to WNL for increased ability to reach into overhead cabinets. Long Term Goal 3: Patient will increase right shoulder strength to 5/5 for increased ability to lift yard work tools and equipment. Long Term Goal 4: Patient will decrease pain to 2/10 or better when using his right arm as dominant with daily activities. Long Term Goal 5: Patient will decrease fascial restrictions to minimal in his right shoulder region.   Problem List Patient Active Problem List  Diagnosis  . HYPERLIPIDEMIA  . HYPERTENSION  . PREMATURE VENTRICULAR CONTRACTIONS  . ATHEROSCLEROTIC CARDIOVASCULAR DISEASE  . DEGENERATIVE JOINT DISEASE  . HYPERGLYCEMIA  . CONSTIPATION  . Shoulder impingement syndrome  .  Pain in joint, shoulder  region  . Muscle weakness (generalized)    End of Session Activity Tolerance: Patient tolerated treatment well General Behavior During Session: [facility] for tasks performed Cognition: [facility] for tasks performed  GO   Fynn Adel L. Lou Loewe, COTA/L  12/18/2011, 4:16 PM

## 2011-12-18 NOTE — Telephone Encounter (Signed)
PT IS OUT OF MED, AND PAST DUE FOR FOLLOW MADE APPT ALREADY

## 2011-12-20 ENCOUNTER — Ambulatory Visit (HOSPITAL_COMMUNITY)
Admission: RE | Admit: 2011-12-20 | Discharge: 2011-12-20 | Disposition: A | Discharge status: Home / Self Care | Payer: PRIVATE HEALTH INSURANCE | Source: Ambulatory Visit | Attending: Orthopaedic Surgery | Admitting: Orthopaedic Surgery

## 2011-12-20 DIAGNOSIS — M6281 Muscle weakness (generalized): Secondary | ICD-10-CM

## 2011-12-20 DIAGNOSIS — M25519 Pain in unspecified shoulder: Secondary | ICD-10-CM

## 2011-12-20 NOTE — Progress Notes (Signed)
Occupational Therapy Treatment Patient Details  Name: Glenn Brooks MRN: [id number] Date of Birth: [birth year]  Today's Date: 12/20/2011 Time: 4098-1191 OT Time Calculation (min): 34 min Manual Therapy 4782-9562 12' Therapeutic Exercises [phone] 22' Visit#: 4  of 12   Re-eval: 01/07/12    Authorization: Man[employer] Medicare   Authorization Time Period: before 10th visit   Authorization Visit#: 4  of 10   Subjective Symptoms/Limitations Symptoms: S:  I feel it is improving Pain Assessment Currently in Pain?: Yes Pain Score:   3 Pain Location: Shoulder Pain Orientation: Right Pain Type: Acute pain  Precautions/Restrictions   PROM/AAROM, progress to AROM as tolerated and when AAROM is 90   Exercise/Treatments Supine Protraction: PROM;AROM;10 reps Horizontal ABduction: PROM;AROM;10 reps External Rotation: PROM;AROM;10 reps Internal Rotation: PROM;AROM;10 reps Flexion: PROM;AROM;10 reps ABduction: PROM;AROM;10 reps Seated Elevation: AROM;15 reps (add tband next visit to scapular stability exercises) Extension: AROM;15 reps Row: AROM;15 reps Protraction: AAROM;10 reps Horizontal ABduction: 10 reps;AAROM External Rotation: AAROM;10 reps Internal Rotation: AAROM;10 reps Flexion: AAROM;10 reps Abduction: AAROM;10 reps Pulleys Flexion: Other (comment) (dc) ABduction: Other (comment) (dc) Therapy Ball Flexion: 20 reps ABduction: 20 reps Right/Left: 5 reps ROM / Strengthening / Isometric Strengthening Wall Wash: add next visit Thumb Tacks: 1' Prot/Ret//Elev/Dep: 1'      Manual Therapy Manual Therapy: Myofascial release Myofascial Release: MFR and manual stretching to right upper arm, scapular, and pectoral region to decrease pain and restrictions and increase AAROM and PROM to [facility] [phone]  Occupational Therapy Assessment and Plan OT Assessment and Plan Clinical Impression Statement: A:  Minimal restrictions only this date.  Pain level much less.  Added AAROM  in seated.  Abduction is only range that is limited in seated.  Min vg to decrease scapular elevation during exercises. OT Plan: P:  Add wall wash and transition to theraband with scapular stability exercises.     Goals Short Term Goals Time to Complete Short Term Goals: 3 weeks Short Term Goal 1: Patient will be educated on a HEP. Short Term Goal 1 Progress: Progressing toward goal Short Term Goal 2: Patient will increase PROM in right shoulder to [facility] for increased ability reaching into overhead cabinets. Short Term Goal 2 Progress: Progressing toward goal Short Term Goal 3: Patient will increase right shoulder strength to 4+/5 for increased ability to lift tools when working outside. Short Term Goal 3 Progress: Progressing toward goal Short Term Goal 4: Patient will decrease pain in his right shoulder to 3/10 when mowing his yard. Short Term Goal 4 Progress: Progressing toward goal Short Term Goal 5: Patient will decrease fascial restriction in his right shoulder from moderate to min-mod. Short Term Goal 5 Progress: Progressing toward goal Long Term Goals Time to Complete Long Term Goals: 6 weeks Long Term Goal 1: Patient will return to prior level of I with all B/IADLs and leisure activities. Long Term Goal 1 Progress: Progressing toward goal Long Term Goal 2: Patient will increase right shoulder AROM to WNL for increased ability to reach into overhead cabinets. Long Term Goal 2 Progress: Progressing toward goal Long Term Goal 3: Patient will increase right shoulder strength to 5/5 for increased ability to lift yard work tools and equipment. Long Term Goal 3 Progress: Progressing toward goal Long Term Goal 4: Patient will decrease pain to 2/10 or better when using his right arm as dominant with daily activities. Long Term Goal 4 Progress: Progressing toward goal Long Term Goal 5: Patient will decrease fascial restrictions to minimal in his  right shoulder region.  Long Term Goal 5  Progress: Progressing toward goal  Problem List Patient Active Problem List  Diagnosis  . HYPERLIPIDEMIA  . HYPERTENSION  . PREMATURE VENTRICULAR CONTRACTIONS  . ATHEROSCLEROTIC CARDIOVASCULAR DISEASE  . DEGENERATIVE JOINT DISEASE  . HYPERGLYCEMIA  . CONSTIPATION  . Shoulder impingement syndrome  . Pain in joint, shoulder region  . Muscle weakness (generalized)    End of Session Activity Tolerance: Patient tolerated treatment well General Behavior During Session: [facility] for tasks performed Cognition: [facility] for tasks performed  GO    Shirlean Mylar, OTR/L  12/20/2011, 1:43 PM

## 2011-12-22 ENCOUNTER — Encounter (HOSPITAL_COMMUNITY): Payer: Self-pay | Admitting: *Deleted

## 2011-12-22 ENCOUNTER — Emergency Department (HOSPITAL_COMMUNITY): Payer: PRIVATE HEALTH INSURANCE

## 2011-12-22 ENCOUNTER — Emergency Department (HOSPITAL_COMMUNITY)
Admission: EM | Admit: 2011-12-22 | Discharge: 2011-12-22 | Disposition: A | Discharge status: Home / Self Care | Payer: PRIVATE HEALTH INSURANCE | Attending: Emergency Medicine | Admitting: Emergency Medicine

## 2011-12-22 DIAGNOSIS — R05 Cough: Secondary | ICD-10-CM

## 2011-12-22 DIAGNOSIS — R059 Cough, unspecified: Secondary | ICD-10-CM | POA: Insufficient documentation

## 2011-12-22 DIAGNOSIS — Z7982 Long term (current) use of aspirin: Secondary | ICD-10-CM | POA: Insufficient documentation

## 2011-12-22 DIAGNOSIS — E669 Obesity, unspecified: Secondary | ICD-10-CM | POA: Insufficient documentation

## 2011-12-22 DIAGNOSIS — I10 Essential (primary) hypertension: Secondary | ICD-10-CM | POA: Insufficient documentation

## 2011-12-22 DIAGNOSIS — I2510 Atherosclerotic heart disease of native coronary artery without angina pectoris: Secondary | ICD-10-CM | POA: Insufficient documentation

## 2011-12-22 MED ORDER — AZITHROMYCIN 250 MG PO TABS: ORAL_TABLET | ORAL | Status: DC

## 2011-12-22 MED ORDER — GUAIFENESIN-CODEINE 100-10 MG/5ML PO SYRP: 10.0000 mL | ORAL_SOLUTION | Freq: Three times a day (TID) | ORAL | Status: AC | PRN

## 2011-12-22 MED ORDER — AZITHROMYCIN 250 MG PO TABS
500.0000 mg | ORAL_TABLET | Freq: Once | ORAL | Status: AC
  Administered 2011-12-22: 500 mg via ORAL
  Filled 2011-12-22: qty 2

## 2011-12-22 NOTE — ED Provider Notes (Signed)
History     CSN: [id number]  Arrival date & time 12/22/11  1108   First MD Initiated Contact with Patient 12/22/11 1200      Chief Complaint  Patient presents with  . Cough  . URI    (Consider location/radiation/quality/duration/timing/severity/associated sxs/prior treatment) HPI Comments: Patient complains of nasal congestion, runny nose, sneezing and intermittently productive cough for one week. He states the cough at times produces white to yellow colored sputum. He states that he has to blow his nose frequently. He denies using over-the-counter cold medications. He also denies chest pain or tightness, shortness of breath, wheezing, fever, chills, vomiting, abdominal pain, or lower extremity edema.  Patient is a 78 y.o. male presenting with URI. The history is provided by the patient.  URI The primary symptoms include cough. Primary symptoms do not include fever, headaches, ear pain, sore throat, swollen glands, wheezing, abdominal pain, nausea, vomiting, myalgias, arthralgias or rash. Primary symptoms comment: Runny nose and nasal congestion The current episode started 6 to 7 days ago. This is a new problem. The problem has not changed since onset. The cough began 6 to 7 days ago. The cough is new. The cough is productive. The sputum is white and yellow.  Symptoms associated with the illness include congestion and rhinorrhea. The illness is not associated with chills, facial pain or sinus pressure. Risk factors for severe complications from URI include being elderly.    Past Medical History  Diagnosis Date  . ASCVD (arteriosclerotic cardiovascular disease) 1993    Status post MI and percutaneous intervention  . PVC (premature ventricular contraction)     No symptoms  . DJD (degenerative joint disease)   . ED (erectile dysfunction)     Secondary to medication  . Hyperlipidemia   . Hyperglycemia   . HTN (hypertension)   . Testosterone deficiency   . Obesity   . Colonic polyp      excised via colonoscope    Past Surgical History  Procedure Date  . Knee surgery     Laparoscopic x 2  . Cataract extraction, bilateral   . Transurethral resection of bladder     Either diagnostic or a  . Shoulder surgery     No family history on file.  History  Substance Use Topics  . Smoking status: Never Smoker   . Smokeless tobacco: Not on file  . Alcohol Use: No      Review of Systems  Constitutional: Negative for fever, chills, diaphoresis, activity change and appetite change.  HENT: Positive for congestion, rhinorrhea and sneezing. Negative for ear pain, sore throat, facial swelling, trouble swallowing, neck pain, neck stiffness and sinus pressure.   Eyes: Negative for visual disturbance.  Respiratory: Positive for cough. Negative for chest tightness, shortness of breath, wheezing and stridor.   Cardiovascular: Negative for chest pain, palpitations and leg swelling.  Gastrointestinal: Negative for nausea, vomiting and abdominal pain.  Genitourinary: Negative for flank pain and difficulty urinating.  Musculoskeletal: Negative for myalgias and arthralgias.  Skin: Negative.  Negative for rash.  Neurological: Negative for dizziness, facial asymmetry, speech difficulty, weakness, numbness and headaches.  Hematological: Negative for adenopathy.  Psychiatric/Behavioral: Negative for confusion.  All other systems reviewed and are negative.    Allergies  Review of patient's allergies indicates no known allergies.  Home Medications   Current Outpatient Rx  Name Route Sig Dispense Refill  . ASPIRIN 81 MG PO TABS Oral Take 81 mg by mouth daily.      Marland Kitchen CARTIA  XT 180 MG PO CP24  TAKE 1 CAPSULE BY MOUTH EVERY DAY 30 capsule 5  . CELECOXIB 200 MG PO CAPS Oral Take 200 mg by mouth 2 (two) times daily.     . DUTASTERIDE-TAMSULOSIN HCL 0.5-0.4 MG PO CAPS Oral Take 1 capsule by mouth daily.     Marland Kitchen METOPROLOL SUCCINATE ER 25 MG PO TB24 Oral Take 1 tablet (25 mg total) by  mouth daily. 90 tablet 3  . NAPROXEN 500 MG PO TABS Oral Take 500 mg by mouth 2 (two) times daily.    Marland Kitchen NITROGLYCERIN 0.4 MG SL SUBL Sublingual Place 1 tablet (0.4 mg total) under the tongue every 5 (five) minutes as needed. 25 tablet 3  . OVER THE COUNTER MEDICATION  1 drop 2 (two) times daily. In left twice a day. Over the counter eye drop    . OVER THE COUNTER MEDICATION  2 (two) times daily. Nose drops for dry nose    . TADALAFIL 5 MG PO TABS Oral Take 5 mg by mouth daily.    Marland Kitchen TERAZOSIN HCL 5 MG PO CAPS Oral Take 1 capsule (5 mg total) by mouth at bedtime. 30 capsule 6  . VITAMIN B-12 1000 MCG PO TABS Oral Take 1,000 mcg by mouth daily.    Marland Kitchen VITAMIN E 400 UNITS PO CAPS Oral Take 400 Units by mouth daily.      BP 149/67  Pulse 95  Temp 98.2 F (36.8 C) (Oral)  Resp 16  Ht 5\' 7"  (1.702 m)  Wt 180 lb (81.647 kg)  BMI 28.19 kg/m2  SpO2 100%  Physical Exam  Nursing note and vitals reviewed. Constitutional: He is oriented to person, place, and time. He appears well-developed and well-nourished. No distress.  HENT:  Head: Normocephalic and atraumatic.  Right Ear: Tympanic membrane and ear canal normal.  Left Ear: Tympanic membrane and ear canal normal.  Nose: Mucosal edema and rhinorrhea present.  Mouth/Throat: Uvula is midline, oropharynx is clear and moist and mucous membranes are normal. No oropharyngeal exudate.  Eyes: EOM are normal. Pupils are equal, round, and reactive to light.  Neck: Normal range of motion. Neck supple.  Cardiovascular: Normal rate, regular rhythm, normal heart sounds and intact distal pulses.   No murmur heard. Pulmonary/Chest: Effort normal. No respiratory distress. He has no wheezes. He has no rales. He exhibits no tenderness.       Coarse lung sounds bilaterally without wheezing, crackles or rales. Lung sounds improved after coughing  Musculoskeletal: Normal range of motion. He exhibits no edema.  Lymphadenopathy:    He has no cervical adenopathy.    Neurological: He is alert and oriented to person, place, and time. He exhibits normal muscle tone. Coordination normal.  Skin: Skin is warm and dry.    ED Course  Procedures (including critical care time)  Labs Reviewed - No data to display Dg Chest 2 View  12/22/2011  *RADIOLOGY REPORT*  Clinical Data: Cough.  Upper respiratory infection.  CHEST - 2 VIEW  Comparison: CT of 05/02/2007 and plain film of 04/29/2007.  Findings: Lateral view degraded by patient arm position.  Moderate thoracic spondylosis. Midline trachea.  Normal heart size and mediastinal contours for age.  No pleural effusion or pneumothorax.  Minimal scarring or atelectasis in the left midlung laterally.  Minimal volume loss at the right lung base.  Biapical pleural thickening.  IMPRESSION: No acute cardiopulmonary disease.   Original Report Authenticated By: Consuello Bossier, M.D.  MDM     Pt is well appearing.  Mucous membranes are moist, No respir distress.  No tachycardia, hypoxia or tachypnea. Symptoms are consistent with URI, although given patient's age and symptoms greater than 1 week, I will prescribe an antibiotic. Pt agrees to return here if sx's worse and to f/u with his PMD on MOnday.    Have discussed patient history ,physical exam findings and care plan with the EDP, Dr. Lorenso Courier.    The patient appears reasonably screened and/or stabilized for discharge and I doubt any other medical condition or other [facility] requiring further screening, evaluation, or treatment in the ED at this time prior to discharge.   Prescribed: Zithromax Guaifenesin with codeine cough syrup   Alfonse Garringer L. [place], [place] 12/23/11 1717

## 2011-12-22 NOTE — ED Notes (Signed)
Pt states cold symptoms and productive cough, yellow in color x 1 week.

## 2011-12-24 ENCOUNTER — Ambulatory Visit (HOSPITAL_COMMUNITY)
Admission: RE | Admit: 2011-12-24 | Discharge: 2011-12-24 | Disposition: A | Discharge status: Home / Self Care | Payer: PRIVATE HEALTH INSURANCE | Source: Ambulatory Visit | Attending: Orthopaedic Surgery | Admitting: Orthopaedic Surgery

## 2011-12-24 DIAGNOSIS — M25519 Pain in unspecified shoulder: Secondary | ICD-10-CM

## 2011-12-24 DIAGNOSIS — M6281 Muscle weakness (generalized): Secondary | ICD-10-CM

## 2011-12-24 NOTE — Progress Notes (Signed)
Occupational Therapy Treatment Patient Details  Name: MICKAL MENO MRN: [id number] Date of Birth: [birth year]  Today's Date: 12/24/2011 Time: 1305-1403 OT Time Calculation (min): 58 min Manual Therapy 105-127 22' Therapeutic Exercise 128-152 24' Ice pack 153-203 10;  Visit#: 5  of 12   Re-eval: 01/07/12    Authorization: [employer]   Authorization Time Period: before 10th visit   Authorization Visit#: 5  of 10   Subjective Symptoms/Limitations Symptoms: S:  My shoulder is doing ok. It is still the same pain from before the surgery. Pain Assessment Currently in Pain?: Yes Pain Score:   5 Pain Orientation: Right Pain Type: Acute pain      Exercise/Treatments Supine Protraction: PROM;AROM;12 reps Horizontal ABduction: PROM;AROM;12 reps External Rotation: PROM;AROM;12 reps Internal Rotation: PROM;AROM;12 reps Flexion: PROM;AROM;12 reps ABduction: PROM;AROM;12 reps Seated Extension: Theraband;10 reps Theraband Level (Shoulder Extension): Level 2 (Red) Retraction: Theraband;10 reps Theraband Level (Shoulder Retraction): Level 2 (Red) Row: Theraband;10 reps Theraband Level (Shoulder Row): Level 2 (Red) Protraction: AAROM;12 reps Horizontal ABduction: AAROM;12 reps External Rotation: AAROM;10 reps Internal Rotation: AAROM;10 reps Flexion: AAROM;10 reps Abduction: AAROM;10 reps Therapy Ball Flexion: 20 reps ABduction: 20 reps Right/Left: 5 reps ROM / Strengthening / Isometric Strengthening Wall Wash: 2' Thumb Tacks: 1' Prot/Ret//Elev/Dep: 1'       Modalities Modalities: Cryotherapy Manual Therapy Manual Therapy: Myofascial release Myofascial Release: MFR and manual stretching to right upper arm, scapular, and pectoral region to decrease pain and restrictions and increase AAROM and PROM to [facility]  Cryotherapy Number Minutes Cryotherapy: 10 Minutes Cryotherapy Location: Shoulder Type of Cryotherapy: Ice pack  Occupational Therapy Assessment and  Plan OT Assessment and Plan Clinical Impression Statement: A:  Patient began visit stating that he is in no pain, then as began talking, patient stated he has taken 3 pain pill and is still in pain and now states his pain is a 5/10 and tha manual therapy has not changed his pain any and he cannot sleep at night and cannot sleep on that side.  Educated patient on ways to position for sleep to help eliminate pain and that sleeping on surgical arm is not a good idea.  Attempted to use ice to decrease pain secondary to some edema around his clavicle.  Patient did not like the ice.  Patient is very difficulty to get a good picture of his pain and restrictions  Added wall wash and scapular strengthening with tband.  Max cues to keep shoulder depressed with wall wash and tband. Rehab Potential: Good OT Plan: P:  Attempt to increase time with wall wash and increase reps with band   Goals Short Term Goals Time to Complete Short Term Goals: 3 weeks Short Term Goal 1: Patient will be educated on a HEP. Short Term Goal 2: Patient will increase PROM in right shoulder to [facility] for increased ability reaching into overhead cabinets. Short Term Goal 3: Patient will increase right shoulder strength to 4+/5 for increased ability to lift tools when working outside. Short Term Goal 4: Patient will decrease pain in his right shoulder to 3/10 when mowing his yard. Short Term Goal 5: Patient will decrease fascial restriction in his right shoulder from moderate to min-mod. Long Term Goals Time to Complete Long Term Goals: 6 weeks Long Term Goal 1: Patient will return to prior level of I with all B/IADLs and leisure activities. Long Term Goal 2: Patient will increase right shoulder AROM to WNL for increased ability to reach into overhead cabinets. Long Term  Goal 3: Patient will increase right shoulder strength to 5/5 for increased ability to lift yard work tools and equipment. Long Term Goal 4: Patient will decrease pain to  2/10 or better when using his right arm as dominant with daily activities. Long Term Goal 5: Patient will decrease fascial restrictions to minimal in his right shoulder region.   Problem List Patient Active Problem List  Diagnosis  . HYPERLIPIDEMIA  . HYPERTENSION  . PREMATURE VENTRICULAR CONTRACTIONS  . ATHEROSCLEROTIC CARDIOVASCULAR DISEASE  . DEGENERATIVE JOINT DISEASE  . HYPERGLYCEMIA  . CONSTIPATION  . Shoulder impingement syndrome  . Pain in joint, shoulder region  . Muscle weakness (generalized)    End of Session Activity Tolerance: Patient tolerated treatment well General Behavior During Session: [address] for tasks performed Cognition: [facility] for tasks performed  GO   Lailah Marcelli L. Toretto Tingler, COTA/L  12/24/2011, 2:20 PM

## 2011-12-27 ENCOUNTER — Ambulatory Visit (HOSPITAL_COMMUNITY)
Admission: RE | Admit: 2011-12-27 | Discharge: 2011-12-27 | Disposition: A | Discharge status: Home / Self Care | Payer: PRIVATE HEALTH INSURANCE | Source: Ambulatory Visit | Attending: Orthopaedic Surgery | Admitting: Orthopaedic Surgery

## 2011-12-27 DIAGNOSIS — IMO0001 Reserved for inherently not codable concepts without codable children: Secondary | ICD-10-CM | POA: Insufficient documentation

## 2011-12-27 DIAGNOSIS — M6281 Muscle weakness (generalized): Secondary | ICD-10-CM | POA: Insufficient documentation

## 2011-12-27 DIAGNOSIS — M25519 Pain in unspecified shoulder: Secondary | ICD-10-CM | POA: Insufficient documentation

## 2011-12-27 NOTE — Progress Notes (Signed)
Occupational Therapy Treatment Patient Details  Name: ADGER CANTERA MRN: [id number] Date of Birth: 10-19-1929  Today's Date: 12/27/2011 Time: 5784-6962 OT Time Calculation (min): 52 min Manual Therapy 952-841 18' Therapeutic Exercises [phone] 75' Visit#: 6  of 12   Re-eval: 01/07/12    Authorization: [employer]   Authorization Time Period: before 10th visit   Authorization Visit#: 6  of 10   Subjective Symptoms/Limitations Symptoms: S:  It hurts where the surgery was. Limitations: PROM/AAROM, progress to AROM as tolerated and when AAROM is 90% Pain Assessment Currently in Pain?: Yes Pain Score:   5 Pain Location: Shoulder Pain Orientation: Right Pain Type: Acute pain  Precautions/Restrictions    PROM/AAROM, progress to AROM as tolerated and when AAROM is 90%   Exercise/Treatments Supine Protraction: PROM;10 reps;AROM;15 reps Horizontal ABduction: PROM;10 reps;AROM;15 reps External Rotation: PROM;10 reps;AROM;15 reps Internal Rotation: PROM;10 reps;AROM;15 reps Flexion: PROM;10 reps;AROM;15 reps ABduction: PROM;10 reps;AROM;15 reps Seated Extension: Theraband;10 reps Theraband Level (Shoulder Extension): Level 3 (Green) Retraction: Theraband;10 reps Theraband Level (Shoulder Retraction): Level 3 (Green) Row: Theraband;10 reps Theraband Level (Shoulder Row): Level 3 (Green) Protraction: AAROM;15 reps Horizontal ABduction: AAROM;15 reps External Rotation: AAROM;15 reps Internal Rotation: AAROM;15 reps Flexion: AAROM;15 reps Abduction: AAROM;15 reps Therapy Ball Flexion: 20 reps ABduction: 20 reps Right/Left: 5 reps ROM / Strengthening / Isometric Strengthening Wall Wash: 3' Thumb Tacks: 1' Prot/Ret//Elev/Dep: 1'        Manual Therapy Manual Therapy: Myofascial release Myofascial Release: MFR and manual stretching to right upper arm, scapular, and pectoral region to decrease pain and restrictions and increase AAROM and PROM to [facility]  [phone]  Occupational Therapy Assessment and Plan OT Assessment and Plan Clinical Impression Statement: A:  Demonstrated great form with AROM in supine and seated AAROM.  WFL AAROM demonstrated in seated this date, therefore will add AROM in seated next visit. OT Plan: P:  Transistion to AROM in seated, add w arms and x to v arms.  Decrease pain to 3/10 wtith functional activities.   Goals Short Term Goals Time to Complete Short Term Goals: 3 weeks Short Term Goal 1: Patient will be educated on a HEP. Short Term Goal 1 Progress: Progressing toward goal Short Term Goal 2: Patient will increase PROM in right shoulder to [facility] for increased ability reaching into overhead cabinets. Short Term Goal 2 Progress: Progressing toward goal Short Term Goal 3: Patient will increase right shoulder strength to 4+/5 for increased ability to lift tools when working outside. Short Term Goal 3 Progress: Progressing toward goal Short Term Goal 4: Patient will decrease pain in his right shoulder to 3/10 when mowing his yard. Short Term Goal 4 Progress: Progressing toward goal Short Term Goal 5: Patient will decrease fascial restriction in his right shoulder from moderate to min-mod. Short Term Goal 5 Progress: Progressing toward goal Long Term Goals Time to Complete Long Term Goals: 6 weeks Long Term Goal 1: Patient will return to prior level of I with all B/IADLs and leisure activities. Long Term Goal 1 Progress: Progressing toward goal Long Term Goal 2: Patient will increase right shoulder AROM to WNL for increased ability to reach into overhead cabinets. Long Term Goal 2 Progress: Progressing toward goal Long Term Goal 3: Patient will increase right shoulder strength to 5/5 for increased ability to lift yard work tools and equipment. Long Term Goal 3 Progress: Progressing toward goal Long Term Goal 4: Patient will decrease pain to 2/10 or better when using his right arm as dominant  with daily  activities. Long Term Goal 4 Progress: Progressing toward goal Long Term Goal 5: Patient will decrease fascial restrictions to minimal in his right shoulder region.  Long Term Goal 5 Progress: Progressing toward goal  Problem List Patient Active Problem List  Diagnosis  . HYPERLIPIDEMIA  . HYPERTENSION  . PREMATURE VENTRICULAR CONTRACTIONS  . ATHEROSCLEROTIC CARDIOVASCULAR DISEASE  . DEGENERATIVE JOINT DISEASE  . HYPERGLYCEMIA  . CONSTIPATION  . Shoulder impingement syndrome  . Pain in joint, shoulder region  . Muscle weakness (generalized)    End of Session Activity Tolerance: Patient tolerated treatment well General Behavior During Session: [facility] for tasks performed Cognition: [facility] for tasks performed  GO    Shirlean Mylar, OTR/L  12/27/2011, 10:04 AM

## 2011-12-31 ENCOUNTER — Ambulatory Visit: Payer: PRIVATE HEALTH INSURANCE | Admitting: Internal Medicine

## 2011-12-31 NOTE — ED Provider Notes (Signed)
Medical screening examination/treatment/procedure(s) were performed by non-physician practitioner and as supervising physician I was immediately available for consultation/collaboration.  Marwan T Powers, MD 12/31/11 2129 

## 2012-01-02 ENCOUNTER — Ambulatory Visit (HOSPITAL_COMMUNITY)
Admission: RE | Admit: 2012-01-02 | Payer: PRIVATE HEALTH INSURANCE | Source: Ambulatory Visit | Attending: Occupational Therapy | Admitting: Occupational Therapy

## 2012-01-04 ENCOUNTER — Ambulatory Visit (HOSPITAL_COMMUNITY): Payer: PRIVATE HEALTH INSURANCE | Admitting: Specialist

## 2012-01-08 ENCOUNTER — Ambulatory Visit (HOSPITAL_COMMUNITY): Payer: PRIVATE HEALTH INSURANCE | Admitting: Specialist

## 2012-01-10 ENCOUNTER — Ambulatory Visit (HOSPITAL_COMMUNITY): Payer: PRIVATE HEALTH INSURANCE | Admitting: Specialist

## 2012-01-15 ENCOUNTER — Ambulatory Visit: Payer: PRIVATE HEALTH INSURANCE | Admitting: Cardiology

## 2012-01-15 ENCOUNTER — Inpatient Hospital Stay (HOSPITAL_COMMUNITY)
Admission: RE | Admit: 2012-01-15 | Payer: PRIVATE HEALTH INSURANCE | Source: Ambulatory Visit | Admitting: Occupational Therapy

## 2012-01-17 ENCOUNTER — Ambulatory Visit (HOSPITAL_COMMUNITY): Payer: PRIVATE HEALTH INSURANCE | Admitting: Occupational Therapy

## 2012-01-18 ENCOUNTER — Emergency Department (HOSPITAL_COMMUNITY)
Admission: EM | Admit: 2012-01-18 | Discharge: 2012-01-18 | Disposition: A | Discharge status: Home / Self Care | Payer: PRIVATE HEALTH INSURANCE | Attending: Emergency Medicine | Admitting: Emergency Medicine

## 2012-01-18 ENCOUNTER — Emergency Department (HOSPITAL_COMMUNITY): Payer: PRIVATE HEALTH INSURANCE

## 2012-01-18 ENCOUNTER — Encounter (HOSPITAL_COMMUNITY): Payer: Self-pay

## 2012-01-18 DIAGNOSIS — E785 Hyperlipidemia, unspecified: Secondary | ICD-10-CM | POA: Insufficient documentation

## 2012-01-18 DIAGNOSIS — Z79899 Other long term (current) drug therapy: Secondary | ICD-10-CM | POA: Insufficient documentation

## 2012-01-18 DIAGNOSIS — M159 Polyosteoarthritis, unspecified: Secondary | ICD-10-CM | POA: Insufficient documentation

## 2012-01-18 DIAGNOSIS — J069 Acute upper respiratory infection, unspecified: Secondary | ICD-10-CM

## 2012-01-18 DIAGNOSIS — M259 Joint disorder, unspecified: Secondary | ICD-10-CM | POA: Insufficient documentation

## 2012-01-18 DIAGNOSIS — E669 Obesity, unspecified: Secondary | ICD-10-CM | POA: Insufficient documentation

## 2012-01-18 DIAGNOSIS — R7309 Other abnormal glucose: Secondary | ICD-10-CM | POA: Insufficient documentation

## 2012-01-18 DIAGNOSIS — I4949 Other premature depolarization: Secondary | ICD-10-CM | POA: Insufficient documentation

## 2012-01-18 DIAGNOSIS — I10 Essential (primary) hypertension: Secondary | ICD-10-CM | POA: Insufficient documentation

## 2012-01-18 DIAGNOSIS — I2510 Atherosclerotic heart disease of native coronary artery without angina pectoris: Secondary | ICD-10-CM | POA: Insufficient documentation

## 2012-01-18 DIAGNOSIS — Z7982 Long term (current) use of aspirin: Secondary | ICD-10-CM | POA: Insufficient documentation

## 2012-01-18 DIAGNOSIS — F528 Other sexual dysfunction not due to a substance or known physiological condition: Secondary | ICD-10-CM | POA: Insufficient documentation

## 2012-01-18 DIAGNOSIS — Z791 Long term (current) use of non-steroidal anti-inflammatories (NSAID): Secondary | ICD-10-CM | POA: Insufficient documentation

## 2012-01-18 MED ORDER — AMOXICILLIN 500 MG PO CAPS: 500.0000 mg | ORAL_CAPSULE | Freq: Three times a day (TID) | ORAL | Status: DC

## 2012-01-18 NOTE — ED Notes (Signed)
Pt reports has had a cold for the past month and a half.  Also c/o generalized abd pain.  Reports decreased appetite.  Denies any n/v/d.  LBM  Was today.

## 2012-01-18 NOTE — ED Provider Notes (Signed)
History   This chart was scribed for Glenn Lennert, MD by Toya Smothers. The patient was seen in room APA19/APA19. Patient's care was started at 1801.  CSN: [id number]  Arrival date & time 01/18/12  1801   First MD Initiated Contact with Patient 01/18/12 1832      Chief Complaint  Patient presents with  . URI  . Abdominal Pain   Patient is a 78 y.o. male presenting with URI and abdominal pain.  URI The primary symptoms include cough and abdominal pain. Primary symptoms do not include fever, sore throat, nausea, vomiting, myalgias or rash. The current episode started more than 1 week ago. This is a recurrent problem. The problem has not changed since onset. The cough began more than 1 week ago. The cough is new. The cough is non-productive. There is nondescript sputum produced.  The abdominal pain began more than 2 days ago. The abdominal pain has been unchanged since its onset. The abdominal pain is generalized. The abdominal pain does not radiate. The abdominal pain is relieved by nothing.  Symptoms associated with the illness include congestion. The illness is not associated with facial pain or sinus pressure. Risk factors for severe complications from URI include being elderly.  Abdominal Pain The primary symptoms of the illness include abdominal pain. The primary symptoms of the illness do not include fever, nausea, vomiting or diarrhea.   Glenn Brooks is a 78 y.o. male who presents to the Emergency Department complaining of 6 weeks of 6 weeks of recurrent, constant, unchanged, nonproductive, moderate cough and mild congestion with one week of associated moderate abdominal pain. Abdominal pain is described as generalized, cramping, and non-radiating. Symptoms are worse at night, while alleviated by nothing. Pt reports no relief with OTC medications. He denies fever, chills, chest pain, SOB, syncope, and weakness. Pt's medical Hx includes HTN, DJD, Hyperglycemia, and ASCVD.  Pt lists  PCP as Dr. Janna Arch.   Past Medical History  Diagnosis Date  . ASCVD (arteriosclerotic cardiovascular disease) 1993    Status post MI and percutaneous intervention  . PVC (premature ventricular contraction)     No symptoms  . DJD (degenerative joint disease)   . ED (erectile dysfunction)     Secondary to medication  . Hyperlipidemia   . Hyperglycemia   . HTN (hypertension)   . Testosterone deficiency   . Obesity   . Colonic polyp     excised via colonoscope   Past Surgical History  Procedure Date  . Knee surgery     Laparoscopic x 2  . Cataract extraction, bilateral   . Transurethral resection of bladder     Either diagnostic or a  . Shoulder surgery    No family history on file.  History  Substance Use Topics  . Smoking status: Never Smoker   . Smokeless tobacco: Not on file  . Alcohol Use: No    Review of Systems  Constitutional: Negative for fever.  HENT: Positive for congestion. Negative for sore throat and sinus pressure.   Respiratory: Positive for cough.   Gastrointestinal: Positive for abdominal pain. Negative for nausea, vomiting and diarrhea.  Musculoskeletal: Negative for myalgias.  Skin: Negative for rash.  All other systems reviewed and are negative.    Allergies  Review of patient's allergies indicates no known allergies.  Home Medications   Current Outpatient Rx  Name Route Sig Dispense Refill  . ASPIRIN 81 MG PO TABS Oral Take 81 mg by mouth daily.      Marland Kitchen  AZITHROMYCIN 250 MG PO TABS  Take two tablets on day one, then one tab qd days 2-5 6 tablet 0  . CARTIA XT 180 MG PO CP24  TAKE 1 CAPSULE BY MOUTH EVERY DAY 30 capsule 5  . CELECOXIB 200 MG PO CAPS Oral Take 200 mg by mouth 2 (two) times daily.     . DUTASTERIDE-TAMSULOSIN HCL 0.5-0.4 MG PO CAPS Oral Take 1 capsule by mouth daily.     Marland Kitchen METOPROLOL SUCCINATE ER 25 MG PO TB24 Oral Take 1 tablet (25 mg total) by mouth daily. 90 tablet 3  . NAPROXEN 500 MG PO TABS Oral Take 500 mg by mouth 2  (two) times daily.    Marland Kitchen NITROGLYCERIN 0.4 MG SL SUBL Sublingual Place 1 tablet (0.4 mg total) under the tongue every 5 (five) minutes as needed. 25 tablet 3  . OVER THE COUNTER MEDICATION  1 drop 2 (two) times daily. In left twice a day. Over the counter eye drop    . OVER THE COUNTER MEDICATION  2 (two) times daily. Nose drops for dry nose    . TADALAFIL 5 MG PO TABS Oral Take 5 mg by mouth daily.    Marland Kitchen TERAZOSIN HCL 5 MG PO CAPS Oral Take 1 capsule (5 mg total) by mouth at bedtime. 30 capsule 6  . VITAMIN B-12 1000 MCG PO TABS Oral Take 1,000 mcg by mouth daily.    Marland Kitchen VITAMIN E 400 UNITS PO CAPS Oral Take 400 Units by mouth daily.      BP 154/56  Pulse 86  Temp 98.1 F (36.7 C) (Oral)  Resp 20  Ht 5\' 7"  (1.702 m)  Wt 180 lb (81.647 kg)  BMI 28.19 kg/m2  SpO2 99%  Physical Exam  Constitutional: He is oriented to person, place, and time. He appears well-developed.  HENT:  Head: Normocephalic and atraumatic.  Eyes: Conjunctivae normal and EOM are normal. No scleral icterus.  Neck: Neck supple. No thyromegaly present.  Cardiovascular: Normal rate and regular rhythm.  Exam reveals no gallop and no friction rub.   No murmur heard. Pulmonary/Chest: No stridor. He has no wheezes. He has no rales. He exhibits no tenderness.  Abdominal: He exhibits no distension. There is no tenderness. There is no rebound.  Musculoskeletal: Normal range of motion. He exhibits no edema.  Lymphadenopathy:    He has no cervical adenopathy.  Neurological: He is oriented to person, place, and time. Coordination normal.  Skin: No rash noted. No erythema.  Psychiatric: He has a normal mood and affect. His behavior is normal.    ED Course  Procedures DIAGNOSTIC STUDIES: Oxygen Saturation is 99% on room air, normal by my interpretation.    COORDINATION OF CARE: 18:37- Evaluated Pt. Pt is awake, alert, and without distress. 18:43- Ordered DG Abd Acute W/Chest 1 time imaging.  Labs Reviewed - No data to  display No results found.   No diagnosis found.    MDM   The chart was scribed for me under my direct supervision.  I personally performed the history, physical, and medical decision making and all procedures in the evaluation of this patient.Glenn Lennert, MD 01/18/12 [phone]

## 2012-01-22 ENCOUNTER — Telehealth (HOSPITAL_COMMUNITY): Payer: Self-pay | Admitting: Specialist

## 2012-01-22 ENCOUNTER — Inpatient Hospital Stay (HOSPITAL_COMMUNITY): Admission: RE | Admit: 2012-01-22 | Payer: PRIVATE HEALTH INSURANCE | Source: Ambulatory Visit | Admitting: Specialist

## 2012-01-22 NOTE — Telephone Encounter (Signed)
Glenn Brooks missed his appointment this morning.  I called to check on him and he stated that he was doing pretty good.  He would like to attend his last 2 scheduled visits and then transition to a HEP.

## 2012-01-24 ENCOUNTER — Ambulatory Visit (INDEPENDENT_AMBULATORY_CARE_PROVIDER_SITE_OTHER): Payer: PRIVATE HEALTH INSURANCE | Admitting: Physician Assistant

## 2012-01-24 ENCOUNTER — Ambulatory Visit (HOSPITAL_COMMUNITY)
Admission: RE | Admit: 2012-01-24 | Discharge: 2012-01-24 | Disposition: A | Discharge status: Home / Self Care | Payer: PRIVATE HEALTH INSURANCE | Source: Ambulatory Visit | Attending: Orthopaedic Surgery | Admitting: Orthopaedic Surgery

## 2012-01-24 ENCOUNTER — Encounter: Payer: Self-pay | Admitting: Physician Assistant

## 2012-01-24 ENCOUNTER — Encounter: Payer: Self-pay | Admitting: *Deleted

## 2012-01-24 VITALS — BP 120/62 | HR 84 | Ht 67.0 in | Wt 198.0 lb

## 2012-01-24 DIAGNOSIS — E785 Hyperlipidemia, unspecified: Secondary | ICD-10-CM

## 2012-01-24 DIAGNOSIS — M6281 Muscle weakness (generalized): Secondary | ICD-10-CM

## 2012-01-24 DIAGNOSIS — I2510 Atherosclerotic heart disease of native coronary artery without angina pectoris: Secondary | ICD-10-CM

## 2012-01-24 DIAGNOSIS — M25519 Pain in unspecified shoulder: Secondary | ICD-10-CM

## 2012-01-24 MED ORDER — NITROGLYCERIN 0.4 MG SL SUBL: 0.4000 mg | SUBLINGUAL_TABLET | SUBLINGUAL | Status: DC | PRN

## 2012-01-24 MED ORDER — TERAZOSIN HCL 5 MG PO CAPS: 5.0000 mg | ORAL_CAPSULE | Freq: Every day | ORAL | Status: DC

## 2012-01-24 NOTE — Progress Notes (Signed)
HPI:  This is an 78 year old African American male patient of Dr. Dietrich Pates who is here for followup. Has history of coronary artery disease status post MI and PCI in 1993 at [facility] for which we do not have details. He also has a history of hypertension, noncompliance, hyperlipidemia, PVCs, and obesity.  The patient admits to chest tightness if he over exerts himself by walking or lifting something heavy. He says it's always been this way and has not gotten worse. It does not radiate and he has no associated dyspnea, diaphoresis, dizziness, or presyncope. He says he also gets chest tightness if he goes out in the cold or the heat in the summer. He rarely uses nitroglycerin. He does not exercise on a regular basis. He does not smoke. He says he is taking his medications on a regular basis.  His last stress echo was performed on 04/13/09 which time ejection fraction was normal with no evidence of ischemia and inferior basal scar and nontransmural posterior infarction.  He had a recent emergency room visit for an upper respiratory infection and is currently taken amoxicillin. He still has some minor cough from this.  No known Allergies  Current Outpatient Prescriptions on File Prior to Visit: amoxicillin (AMOXIL) 500 MG capsule, Take 1 capsule (500 mg total) by mouth 3 (three) times daily., Disp: 21 capsule, Rfl: 0 aspirin 81 MG tablet, Take 81 mg by mouth daily.  , Disp: , Rfl:  celecoxib (CELEBREX) 200 MG capsule, Take 200 mg by mouth 2 (two) times daily. , Disp: , Rfl:  diltiazem (CARDIZEM CD) 180 MG 24 hr capsule, Take 180 mg by mouth daily., Disp: , Rfl:  metoprolol succinate (TOPROL-XL) 25 MG 24 hr tablet, Take 1 tablet (25 mg total) by mouth daily., Disp: 90 tablet, Rfl: 3 nitroGLYCERIN (NITROSTAT) 0.4 MG SL tablet, Place 1 tablet (0.4 mg total) under the tongue every 5 (five) minutes as needed., Disp: 25 tablet, Rfl: 3 OVER THE COUNTER MEDICATION, 1 drop 2 (two) times daily. In left  twice a day. Over the counter eye drop, Disp: , Rfl:  OVER THE COUNTER MEDICATION, 2 (two) times daily. Nose drops for dry nose, Disp: , Rfl:  simvastatin (ZOCOR) 40 MG tablet, Take 40 mg by mouth daily., Disp: , Rfl:  tadalafil (CIALIS) 5 MG tablet, Take 5 mg by mouth daily., Disp: , Rfl:  terazosin (HYTRIN) 5 MG capsule, Take 1 capsule (5 mg total) by mouth at bedtime., Disp: 30 capsule, Rfl: 6    Past Medical History:   ASCVD (arteriosclerotic cardiovascular disease) 1993           Comment:Status post MI and percutaneous intervention   PVC (premature ventricular contraction)                        Comment:No symptoms   DJD (degenerative joint disease)                             ED (erectile dysfunction)                                      Comment:Secondary to medication   Hyperlipidemia  Hyperglycemia                                                HTN (hypertension)                                           Testosterone deficiency                                      Obesity                                                      Colonic polyp                                                  Comment:excised via colonoscope  Past Surgical History:   KNEE SURGERY                                                   Comment:Laparoscopic x 2   CATARACT EXTRACTION, BILATERAL                               TRANSURETHRAL RESECTION OF BLADDER                             Comment:Either diagnostic or a   SHOULDER SURGERY                                            No family history on file.   Social History   Marital Status: Married             Spouse Name:                      Years of Education:                 Number of children:             Occupational History   None on file  Social History Main Topics   Smoking Status: Never Smoker                     Smokeless Status: Not on file                      Alcohol Use: No             Drug  Use: No             Sexual Activity: Not on  file        Other Topics            Concern   None on file  Social History Narrative   None on file    ZOX:WRUEAVW arthritis in his knees which limits exercise, otherwise see history of present illness   PHYSICAL EXAM: Well-nournished, in no acute distress. Neck: No JVD, HJR, Bruit, or thyroid enlargement  Lungs:Decreased breath sounds throughout without wheezing rales or rhonchi  Cardiovascular: RRR, PMI not displaced, positive S4 and 2/6 systolic murmur at the left sternal border, no bruit, thrill, or heave.  Abdomen: BS normal. Soft without organomegaly, masses, lesions or tenderness.  Extremities: without cyanosis, clubbing or edema. Good distal pulses bilateral  SKin: Warm, no lesions or rashes   Musculoskeletal: No deformities  Neuro: no focal signs   BP 120/62  Pulse 84  Ht 5\' 7"  (1.702 m)  Wt 198 lb (89.812 kg)  BMI 31.01 kg/m2  SpO2 97%   UJW:JXBJYN sinus rhythm with right bundle branch block and left anterior fascicular block, no change from prior tracing

## 2012-01-24 NOTE — Progress Notes (Signed)
Occupational Therapy Treatment Patient Details  Name: Glenn Brooks MRN: [id number] Date of Birth: [birth year]  Today's Date: 01/24/2012 Time: 7829-5621 OT Time Calculation (min): 28 min Manual Therapy 850-900 10' Reassessment (ROM and MMT) 900-918 Visit#: 7  of 12   Re-eval: 01/24/12    Authorization: [employer]   Authorization Time Period: before 10th visit   Authorization Visit#: 7  of 10   Subjective S:  I can do alot more with my arm.  Its still hard to lift things.  I really cant afford the copays to keep coming. Special Tests: UEFI was 25% and is currently 70% Pain Assessment Currently in Pain?: No/denies Pain Score: 0-No pain  Precautions/Restrictions    Progress as tolerated Exercise/Treatments Supine Protraction: PROM;5 reps Horizontal ABduction: PROM;5 reps External Rotation: PROM;5 reps Internal Rotation: PROM;5 reps Flexion: PROM;5 reps ABduction: PROM;5 reps    Manual Therapy Manual Therapy: Myofascial release Myofascial Release: MFR and manual stretching to right upper arm, scapular, and pectoral region to decrease pain and restrictions and increase AROM to [facility] for ADLs.  850-900  Occupational Therapy Assessment and Plan OT Assessment and Plan Clinical Impression Statement: A:  Please refer to DC summary.  [name] has met or made progress towards all OT goals.  He is requesting DC due to financial concerns.  I have educated him on a HEP for scapular strengthening.   Rehab Potential: Good OT Plan: P:  DC with HEP.     Goals Short Term Goals Time to Complete Short Term Goals: 3 weeks Short Term Goal 1: Patient will be educated on a HEP. Short Term Goal 1 Progress: Met Short Term Goal 2: Patient will increase PROM in right shoulder to [facility] for increased ability reaching into overhead cabinets. Short Term Goal 2 Progress: Met Short Term Goal 3: Patient will increase right shoulder strength to 4+/5 for increased ability to lift tools when  working outside. Short Term Goal 3 Progress: Met Short Term Goal 4: Patient will decrease pain in his right shoulder to 3/10 when mowing his yard. Short Term Goal 4 Progress: Met Short Term Goal 5: Patient will decrease fascial restriction in his right shoulder from moderate to min-mod. Short Term Goal 5 Progress: Met Long Term Goals Time to Complete Long Term Goals: 6 weeks Long Term Goal 1: Patient will return to prior level of I with all B/IADLs and leisure activities. Long Term Goal 1 Progress: Progressing toward goal Long Term Goal 2: Patient will increase right shoulder AROM to WNL for increased ability to reach into overhead cabinets. Long Term Goal 2 Progress: Met Long Term Goal 3: Patient will increase right shoulder strength to 5/5 for increased ability to lift yard work tools and equipment. Long Term Goal 3 Progress: Partly met Long Term Goal 4: Patient will decrease pain to 2/10 or better when using his right arm as dominant with daily activities. Long Term Goal 4 Progress: Met Long Term Goal 5: Patient will decrease fascial restrictions to minimal in his right shoulder region.  Long Term Goal 5 Progress: Met  Problem List Patient Active Problem List  Diagnosis  . HYPERLIPIDEMIA  . HYPERTENSION  . PREMATURE VENTRICULAR CONTRACTIONS  . ATHEROSCLEROTIC CARDIOVASCULAR DISEASE  . DEGENERATIVE JOINT DISEASE  . HYPERGLYCEMIA  . CONSTIPATION  . Shoulder impingement syndrome  . Pain in joint, shoulder region  . Muscle weakness (generalized)    End of Session Activity Tolerance: Patient tolerated treatment well General Behavior During Session: [facility] for tasks performed  Cognition: WFL for tasks performed OT Plan of Care OT Home Exercise Plan: Scapular and Rotator Cuff Strengthening with Tband Consulted and Agree with Plan of Care: Patient  GO Functional Assessment Tool Used: UEFI was 25% I level.  Currently is 70% I level. Functional Limitation: Carrying, moving and  handling objects Carrying, Moving and Handling Objects Goal Status [phone]): At least 1 percent but less than 20 percent impaired, limited or restricted Carrying, Moving and Handling Objects Discharge Status [phone]): At least 20 percent but less than 40 percent impaired, limited or restricted  Shirlean Mylar, OTR/L  01/24/2012, 9:26 AM

## 2012-01-24 NOTE — Patient Instructions (Addendum)
Your physician has requested that you have en exercise stress myoview. For further information please visit [email or web address] Please follow instruction sheet, as given.  Your physician recommends that you continue on your current medications as directed. Please refer to the Current Medication list given to you today.  Your physician recommends that you return for fasting lab work the day you have your stress myoview.

## 2012-01-24 NOTE — Addendum Note (Signed)
Addended by: Lisabeth Devoid F on: 01/24/2012 11:03 AM   Modules accepted: Orders

## 2012-01-24 NOTE — Assessment & Plan Note (Signed)
Patient hasn't had a lipid profile since 2011. We will check a fasting lipid panel and LFTs.

## 2012-01-24 NOTE — Assessment & Plan Note (Addendum)
Patient has a history of remote MI in 1993 treated with PCI At [facility] for which we have no details. He now admits to exertional chest tightness that has not changed over the years that occurs with walking and heavy lifting or over exerting himself. His last stress echo was in 2011 and showed no ischemia. We will order a stress Myoview to rule out ischemia.

## 2012-01-24 NOTE — Assessment & Plan Note (Signed)
Well controlled 

## 2012-01-30 ENCOUNTER — Encounter (HOSPITAL_COMMUNITY): Payer: Self-pay

## 2012-01-30 ENCOUNTER — Encounter (HOSPITAL_COMMUNITY)
Admission: RE | Admit: 2012-01-30 | Discharge: 2012-01-30 | Disposition: A | Discharge status: Home / Self Care | Payer: PRIVATE HEALTH INSURANCE | Source: Ambulatory Visit | Attending: Physician Assistant | Admitting: Physician Assistant

## 2012-01-30 ENCOUNTER — Ambulatory Visit (HOSPITAL_COMMUNITY)
Admission: RE | Admit: 2012-01-30 | Discharge: 2012-01-30 | Disposition: A | Discharge status: Home / Self Care | Payer: PRIVATE HEALTH INSURANCE | Source: Ambulatory Visit | Attending: Cardiology | Admitting: Cardiology

## 2012-01-30 ENCOUNTER — Encounter (HOSPITAL_COMMUNITY): Payer: Self-pay | Admitting: Cardiology

## 2012-01-30 DIAGNOSIS — I2510 Atherosclerotic heart disease of native coronary artery without angina pectoris: Secondary | ICD-10-CM | POA: Insufficient documentation

## 2012-01-30 DIAGNOSIS — R0789 Other chest pain: Secondary | ICD-10-CM | POA: Insufficient documentation

## 2012-01-30 DIAGNOSIS — R079 Chest pain, unspecified: Secondary | ICD-10-CM

## 2012-01-30 DIAGNOSIS — R9439 Abnormal result of other cardiovascular function study: Secondary | ICD-10-CM | POA: Insufficient documentation

## 2012-01-30 MED ORDER — TECHNETIUM TC 99M SESTAMIBI - CARDIOLITE
10.0000 | Freq: Once | INTRAVENOUS | Status: AC | PRN
  Administered 2012-01-30: 10 via INTRAVENOUS

## 2012-01-30 MED ORDER — TECHNETIUM TC 99M SESTAMIBI - CARDIOLITE
30.0000 | Freq: Once | INTRAVENOUS | Status: AC | PRN
  Administered 2012-01-30: 31 via INTRAVENOUS

## 2012-01-30 MED ORDER — REGADENOSON 0.4 MG/5ML IV SOLN
INTRAVENOUS | Status: AC
  Filled 2012-01-30: qty 5

## 2012-01-30 MED ORDER — SODIUM CHLORIDE 0.9 % IJ SOLN
INTRAMUSCULAR | Status: AC
  Administered 2012-01-30: 10 mL via INTRAVENOUS
  Filled 2012-01-30: qty 10

## 2012-01-30 NOTE — Progress Notes (Signed)
Stress Lab Nurses Notes - Glenn Brooks  Glenn Brooks 01/30/2012 Reason for doing test: CAD and chest tightness Type of test: Stress Cardiolite Nurse performing test: Parke Poisson, RN Nuclear Medicine Tech: Lou Cal Echo Tech: Not Applicable MD performing test: R. Rothbart Family MD: Dondiego Test explained and consent signed: yes IV started: 22g jelco, Saline lock flushed, No redness or edema and Saline lock started in radiology Symptoms: Knee discomfort Treatment/Intervention: None Reason test stopped: fatigue and reached target HR After recovery IV was: Discontinued via X-ray tech and No redness or edema Patient to return to Nuc. Med at : 12:00 Patient discharged: Home Patient's Condition upon discharge was: stable Comments: During test peak BP 165/62 & HR 126.  Recovery BP 86/50 & HR 80.  Symptoms resolved in recovery.  [name] T

## 2012-02-12 ENCOUNTER — Ambulatory Visit: Payer: PRIVATE HEALTH INSURANCE | Admitting: Adult Health

## 2012-03-05 ENCOUNTER — Ambulatory Visit: Payer: PRIVATE HEALTH INSURANCE | Admitting: Cardiology

## 2012-03-05 ENCOUNTER — Encounter: Payer: Self-pay | Admitting: Cardiology

## 2012-03-25 ENCOUNTER — Encounter: Payer: Self-pay | Admitting: Cardiology

## 2012-03-25 ENCOUNTER — Ambulatory Visit: Payer: PRIVATE HEALTH INSURANCE | Admitting: Cardiology

## 2012-03-25 ENCOUNTER — Telehealth: Payer: Self-pay | Admitting: Cardiology

## 2012-03-25 NOTE — Telephone Encounter (Signed)
Left message with patient's wife to have patient call office to reschedule appointment.  Letter also mailed. / tgs

## 2012-07-08 ENCOUNTER — Other Ambulatory Visit: Payer: Self-pay | Admitting: Adult Health

## 2012-07-08 NOTE — Telephone Encounter (Signed)
rx sent to pharmacy by e-script  

## 2012-07-17 ENCOUNTER — Ambulatory Visit: Payer: PRIVATE HEALTH INSURANCE | Admitting: Cardiology

## 2012-07-30 ENCOUNTER — Ambulatory Visit (INDEPENDENT_AMBULATORY_CARE_PROVIDER_SITE_OTHER): Payer: PRIVATE HEALTH INSURANCE | Admitting: Cardiology

## 2012-07-30 ENCOUNTER — Encounter: Payer: Self-pay | Admitting: Cardiology

## 2012-07-30 VITALS — BP 181/97 | HR 93 | Ht 67.0 in | Wt 189.0 lb

## 2012-07-30 DIAGNOSIS — I2510 Atherosclerotic heart disease of native coronary artery without angina pectoris: Secondary | ICD-10-CM

## 2012-07-30 DIAGNOSIS — E785 Hyperlipidemia, unspecified: Secondary | ICD-10-CM

## 2012-07-30 DIAGNOSIS — R7309 Other abnormal glucose: Secondary | ICD-10-CM

## 2012-07-30 DIAGNOSIS — I10 Essential (primary) hypertension: Secondary | ICD-10-CM

## 2012-07-30 MED ORDER — METOPROLOL SUCCINATE ER 50 MG PO TB24: 50.0000 mg | ORAL_TABLET | Freq: Every day | ORAL | Status: DC

## 2012-07-30 MED ORDER — CHLORTHALIDONE 25 MG PO TABS: 12.5000 mg | ORAL_TABLET | Freq: Every day | ORAL | Status: DC

## 2012-07-30 MED ORDER — CLONIDINE HCL 0.2 MG/24HR TD PTWK: 1.0000 | MEDICATED_PATCH | TRANSDERMAL | Status: DC

## 2012-07-30 MED ORDER — NITROGLYCERIN 0.4 MG SL SUBL: 0.4000 mg | SUBLINGUAL_TABLET | SUBLINGUAL | Status: DC | PRN

## 2012-07-30 NOTE — Progress Notes (Signed)
Patient ID: Glenn Brooks, male   DOB: 11-Feb-1930, 79 y.o.   MRN: [id number]  HPI: Schedule return visit for this nice gentleman with long-standing hypertension and a remote episode of coronary artery disease.  Patient unable to accurately report on his current medications, and the exact identity and the dosage of his medications are unknown. Blood pressure control has been suboptimal for years, but this is likely due to issues with compliance and medical sophistication.  Patient has a history of coronary disease with percutaneous intervention at a [facility] 20 years ago. Stress echo in 2011 and stress nuclear study in 2013 showed no ischemia but a small area of inferolateral infarction.  Current Outpatient Prescriptions  Medication Sig Dispense Refill  . aspirin 81 MG tablet Take 81 mg by mouth daily.        . celecoxib (CELEBREX) 200 MG capsule Take 200 mg by mouth 2 (two) times daily.       Marland Kitchen diltiazem (CARDIZEM CD) 180 MG 24 hr capsule Take 180 mg by mouth daily.      Marland Kitchen LORazepam (ATIVAN) 2 MG tablet Take 2 mg by mouth at bedtime.      . nitroGLYCERIN (NITROSTAT) 0.4 MG SL tablet Place 1 tablet (0.4 mg total) under the tongue every 5 (five) minutes as needed.  25 tablet  6  . omeprazole (PRILOSEC) 20 MG capsule Take 20 mg by mouth daily.      . pregabalin (LYRICA) 75 MG capsule Take 75 mg by mouth 2 (two) times daily.      . simvastatin (ZOCOR) 40 MG tablet Take 40 mg by mouth daily.      . tadalafil (CIALIS) 5 MG tablet Take 5 mg by mouth daily.      . chlorthalidone (HYGROTON) 25 MG tablet Take 0.5 tablets (12.5 mg total) by mouth daily.  30 tablet  5  . cloNIDine (CATAPRES - DOSED IN MG/24 HR) 0.2 mg/24hr patch Place 1 patch (0.2 mg total) onto the skin once a week.  4 patch  12  . metoprolol succinate (TOPROL-XL) 50 MG 24 hr tablet Take 1 tablet (50 mg total) by mouth daily. Take with or immediately following a meal.  30 tablet  5   No current facility-administered medications  for this visit.   No Known Allergies   Past medical history, social history, and family history reviewed and updated.  ROS: Rare episodes of chest pain-has not used sublingual nitroglycerin for months if not years. Denies dyspnea, orthopnea, PND, lightheadedness or syncope. All other systems reviewed and are negative.  PHYSICAL EXAM: BP 181/97  Pulse 93  Ht 5\' 7"  (1.702 m)  Wt 85.73 kg (189 lb)  BMI 29.59 kg/m2  SpO2 98%;  Body mass index is 29.59 kg/(m^2).  Repeat blood pressure verifies initial value with moderate systolic and mild diastolic hypertension.  General-Well developed; no acute distress Body habitus-mildly to moderately overweight Neck-No JVD; no carotid bruits Lungs-clear lung fields; resonant to percussion Cardiovascular-normal PMI; normal S1 and S2; prominent fourth heart sound Abdomen-normal bowel sounds; soft and non-tender without masses or organomegaly Musculoskeletal-No deformities, no cyanosis or clubbing Neurologic-Normal cranial nerves; symmetric strength and tone Skin-Warm, no significant lesions Extremities-distal pulses intact; no edema  [place] Bing, MD 07/30/2012  4:43 PM  ASSESSMENT AND PLAN

## 2012-07-30 NOTE — Assessment & Plan Note (Addendum)
Currently requiring no medication for borderline diabetes. No FBG >135 during the past 5 years.

## 2012-07-30 NOTE — Assessment & Plan Note (Signed)
Most recent lipid profile was excellent in 2011. We will verify current lipid-lowering medication and reassess.

## 2012-07-30 NOTE — Assessment & Plan Note (Signed)
Very stable course with coronary artery disease. We will continue to optimally control cardiovascular risk factors.

## 2012-07-30 NOTE — Patient Instructions (Addendum)
Your physician recommends that you schedule a follow-up appointment in: 5 WEEKS  Your physician has requested that you regularly monitor and record your blood pressure readings at home. Please use the same machine at the same time of day to check your readings and record them to bring to your follow-up visit.  Your physician recommends THAT YOU BRING ALL YOUR MEDICATION BOTTLES TO ALL YOUR OFFICE VISITS GOING FORWARD  Your physician recommends that you return for lab work in: 3 WEEKS (BMET) The patient's paper medical record is not available during this visit. It has been removed from this office and cannot be located.  Your physician has recommended you make the following change in your medication:   1) INCREASE TOPROL TO 50MG  ONCE DAILY 2) CHLORTHALIDONE 12.5MG  ONCE DAILY 3) CLONIDINE PATCH

## 2012-07-30 NOTE — Assessment & Plan Note (Signed)
Blood pressure control has been generally inadequate although BP determination of his most recent office visit was excellent. We will attempt to reestablish an effective regime and will see him frequently until that is accomplished.

## 2012-07-30 NOTE — Progress Notes (Deleted)
Name: Glenn Brooks    DOB: 11-Sep-1929  Age: 79 y.o.  MR#: [id number]       PCP:  Isabella Stalling, MD      Insurance: Payor: Cleatrice Burke MEDICARE  Plan: [facility]  Product Type: *No Product type*    CC:   No chief complaint on file.  list VS Filed Vitals:   07/30/12 1332  BP: 181/97  Pulse: 93  Height: 5\' 7"  (1.702 m)  Weight: 189 lb (85.73 kg)  SpO2: 98%    Weights Current Weight  07/30/12 189 lb (85.73 kg)  01/24/12 198 lb (89.812 kg)  01/18/12 180 lb (81.647 kg)    Blood Pressure  BP Readings from Last 3 Encounters:  07/30/12 181/97  01/24/12 120/62  01/18/12 154/56     Admit date:  (Not on file) Last encounter with RMR:  07/17/2012   Allergy Review of patient's allergies indicates no known allergies.  Current Outpatient Prescriptions  Medication Sig Dispense Refill  . amoxicillin (AMOXIL) 500 MG capsule Take 1 capsule (500 mg total) by mouth 3 (three) times daily.  21 capsule  0  . aspirin 81 MG tablet Take 81 mg by mouth daily.        Marland Kitchen CARTIA XT 180 MG 24 hr capsule TAKE 1 CAPSULE BY MOUTH EVERY DAY  30 capsule  1  . celecoxib (CELEBREX) 200 MG capsule Take 200 mg by mouth 2 (two) times daily.       Marland Kitchen diltiazem (CARDIZEM CD) 180 MG 24 hr capsule Take 180 mg by mouth daily.      . metoprolol succinate (TOPROL-XL) 25 MG 24 hr tablet Take 1 tablet (25 mg total) by mouth daily.  90 tablet  3  . naproxen (NAPROSYN) 500 MG tablet Take 500 mg by mouth 2 (two) times daily with a meal.      . nitroGLYCERIN (NITROSTAT) 0.4 MG SL tablet Place 1 tablet (0.4 mg total) under the tongue every 5 (five) minutes as needed.  25 tablet  6  . omeprazole (PRILOSEC) 20 MG capsule Take 20 mg by mouth daily.      Marland Kitchen OVER THE COUNTER MEDICATION 1 drop 2 (two) times daily. In left twice a day. Over the counter eye drop      . OVER THE COUNTER MEDICATION 2 (two) times daily. Nose drops for dry nose      . simvastatin (ZOCOR) 40 MG tablet Take 40 mg by mouth daily.      . tadalafil  (CIALIS) 5 MG tablet Take 5 mg by mouth daily.      Marland Kitchen terazosin (HYTRIN) 5 MG capsule Take 1 capsule (5 mg total) by mouth at bedtime.  90 capsule  3   No current facility-administered medications for this visit.    Discontinued Meds:   There are no discontinued medications.  Patient Active Problem List   Diagnosis Date Noted  . HYPERLIPIDEMIA 02/10/2010  . HYPERTENSION 03/24/2009  . ATHEROSCLEROTIC CARDIOVASCULAR DISEASE 03/24/2009  . DEGENERATIVE JOINT DISEASE 03/24/2009  . HYPERGLYCEMIA 03/24/2009    LABS    Component Value Date/Time   NA 137 11/08/2011 0830   NA 137 10/09/2011 2012   NA 141 01/22/2011 1445   K 3.7 11/08/2011 0830   K 3.8 10/09/2011 2012   K 4.9 01/22/2011 1445   CL 102 11/08/2011 0830   CL 102 10/09/2011 2012   CL 106 01/22/2011 1445   CO2 24 11/08/2011 0830   CO2 24 10/09/2011 2012   CO2  26 01/22/2011 1445   GLUCOSE 132* 11/08/2011 0830   GLUCOSE 132* 10/09/2011 2012   GLUCOSE 82 01/22/2011 1445   BUN 13 11/08/2011 0830   BUN 10 10/09/2011 2012   BUN 14 01/22/2011 1445   CREATININE 0.92 11/08/2011 0830   CREATININE 1.01 10/09/2011 2012   CREATININE 1.03 01/22/2011 1445   CREATININE 1.01 03/23/2009   CALCIUM 9.3 11/08/2011 0830   CALCIUM 9.7 10/09/2011 2012   CALCIUM 10.1 01/22/2011 1445   GFRNONAA 78* 11/08/2011 0830   GFRNONAA 68* 10/09/2011 2012   GFRNONAA >60 10/13/2008 1229   GFRAA 90* 11/08/2011 0830   GFRAA 79* 10/09/2011 2012   GFRAA  Value: >60        The eGFR has been calculated using the MDRD equation. This calculation has not been validated in all clinical situations. eGFR's persistently <60 mL/min signify possible Chronic Kidney Disease. 10/13/2008 1229   CMP     Component Value Date/Time   NA 137 11/08/2011 0830   K 3.7 11/08/2011 0830   CL 102 11/08/2011 0830   CO2 24 11/08/2011 0830   GLUCOSE 132* 11/08/2011 0830   BUN 13 11/08/2011 0830   CREATININE 0.92 11/08/2011 0830   CREATININE 1.03 01/22/2011 1445   CALCIUM 9.3 11/08/2011 0830   PROT 7.3  01/22/2011 1445   ALBUMIN 4.6 01/22/2011 1445   AST 26 01/22/2011 1445   ALT 23 01/22/2011 1445   ALKPHOS 58 01/22/2011 1445   BILITOT 0.4 01/22/2011 1445   GFRNONAA 78* 11/08/2011 0830   GFRAA 90* 11/08/2011 0830       Component Value Date/Time   WBC 4.9 11/08/2011 0830   WBC 3.6* 10/09/2011 2008   WBC 3.5* 01/22/2011 1447   HGB 13.3 11/08/2011 0830   HGB 13.6 10/09/2011 2008   HGB 14.2 01/22/2011 1447   HCT 37.9* 11/08/2011 0830   HCT 40.2 10/09/2011 2008   HCT 42.3 01/22/2011 1447   MCV 94.3 11/08/2011 0830   MCV 95.5 10/09/2011 2008   MCV 97.5 01/22/2011 1447    Lipid Panel     Component Value Date/Time   CHOL 143 06/14/2009   TRIG 76 06/14/2009   HDL 49 06/14/2009   LDLCALC 79 06/14/2009    ABG No results found for this basename: phart, pco2, pco2art, po2, po2art, hco3, tco2, acidbasedef, o2sat     Lab Results  Component Value Date   TSH 1.499 01/22/2011   BNP (last 3 results) No results found for this basename: PROBNP,  in the last 8760 hours Cardiac Panel (last 3 results) No results found for this basename: CKTOTAL, CKMB, TROPONINI, RELINDX,  in the last 72 hours  Iron/TIBC/Ferritin No results found for this basename: iron, tibc, ferritin     EKG Orders placed in visit on 01/24/12  . EKG 12-LEAD     Prior Assessment and Plan Problem List as of 07/30/2012     ICD-9-CM   HYPERLIPIDEMIA   Last Assessment & Plan   01/24/2012 Office Visit Written 01/24/2012 10:52 AM by Dyann Kief, PA     Patient hasn't had a lipid profile since 2011. We will check a fasting lipid panel and LFTs.    HYPERTENSION   Last Assessment & Plan   01/24/2012 Office Visit Written 01/24/2012 10:53 AM by Dyann Kief, PA     Well-controlled    ATHEROSCLEROTIC CARDIOVASCULAR DISEASE   Last Assessment & Plan   01/24/2012 Office Visit Edited 01/24/2012 10:52 AM by Dyann Kief, PA  Patient has a history of remote MI in 1993 treated with PCI At [facility] for which we have  no details. He now admits to exertional chest tightness that has not changed over the years that occurs with walking and heavy lifting or over exerting himself. His last stress echo was in 2011 and showed no ischemia. We will order a stress Myoview to rule out ischemia.    DEGENERATIVE JOINT DISEASE   HYPERGLYCEMIA       Imaging: No results found.

## 2012-08-20 ENCOUNTER — Other Ambulatory Visit: Payer: Self-pay | Admitting: *Deleted

## 2012-08-20 DIAGNOSIS — I10 Essential (primary) hypertension: Secondary | ICD-10-CM

## 2012-08-22 ENCOUNTER — Telehealth: Payer: Self-pay | Admitting: Cardiology

## 2012-08-22 DIAGNOSIS — I10 Essential (primary) hypertension: Secondary | ICD-10-CM

## 2012-08-22 NOTE — Telephone Encounter (Signed)
.  left message to have patient return my call.  

## 2012-08-22 NOTE — Telephone Encounter (Signed)
Patient states that he received letter stating that he needed to have lab work done.  He states that he just had lab work done 1 week ago. / tgs

## 2012-08-25 NOTE — Telephone Encounter (Signed)
Noted pt was advised to have BMET in 3 weeks from 07-30-12 OV, called pt to advise to still have labs drawn, pt understood and will have drawn

## 2012-08-26 LAB — BASIC METABOLIC PANEL
CO2: 24 mEq/L (ref 19–32)
Calcium: 9.7 mg/dL (ref 8.4–10.5)
Chloride: 105 mEq/L (ref 96–112)
Creat: 1.15 mg/dL (ref 0.50–1.35)
Glucose, Bld: 101 mg/dL — ABNORMAL HIGH (ref 70–99)

## 2012-08-28 ENCOUNTER — Encounter: Payer: Self-pay | Admitting: *Deleted

## 2012-09-03 ENCOUNTER — Encounter: Payer: Self-pay | Admitting: Cardiology

## 2012-09-03 ENCOUNTER — Ambulatory Visit (INDEPENDENT_AMBULATORY_CARE_PROVIDER_SITE_OTHER): Payer: Medicare Other | Admitting: Cardiology

## 2012-09-03 VITALS — BP 131/71 | HR 75 | Ht 67.0 in | Wt 191.2 lb

## 2012-09-03 DIAGNOSIS — I10 Essential (primary) hypertension: Secondary | ICD-10-CM

## 2012-09-03 DIAGNOSIS — E785 Hyperlipidemia, unspecified: Secondary | ICD-10-CM

## 2012-09-03 DIAGNOSIS — I2510 Atherosclerotic heart disease of native coronary artery without angina pectoris: Secondary | ICD-10-CM

## 2012-09-03 DIAGNOSIS — R7309 Other abnormal glucose: Secondary | ICD-10-CM

## 2012-09-03 NOTE — Assessment & Plan Note (Addendum)
Patient continues to be free of cardiopulmonary symptoms. We will persist in our efforts to maintain optimal control of cardiovascular risk factors.

## 2012-09-03 NOTE — Progress Notes (Deleted)
Name: Glenn Brooks    DOB: Aug 29, 1929  Age: 79 y.o.  MR#: [id number]       PCP:  Isabella Stalling, MD      Insurance: Payor: [employer] [place] [employer] / Plan: BLUE MEDICARE / Product Type: *No Product type* /   CC:   No chief complaint on file. LIST  VS Filed Vitals:   09/03/12 1127  BP: 131/71  Pulse: 75  Height: 5\' 7"  (1.702 m)  Weight: 191 lb 4 oz (86.75 kg)    Weights Current Weight  09/03/12 191 lb 4 oz (86.75 kg)  07/30/12 189 lb (85.73 kg)  01/24/12 198 lb (89.812 kg)    Blood Pressure  BP Readings from Last 3 Encounters:  09/03/12 131/71  07/30/12 181/97  01/24/12 120/62     Admit date:  (Not on file) Last encounter with RMR:  08/22/2012   Allergy Review of patient's allergies indicates no known allergies.  Current Outpatient Prescriptions  Medication Sig Dispense Refill  . aspirin 81 MG tablet Take 81 mg by mouth daily.        . celecoxib (CELEBREX) 200 MG capsule Take 200 mg by mouth daily.       . chlorthalidone (HYGROTON) 25 MG tablet Take 0.5 tablets (12.5 mg total) by mouth daily.  30 tablet  5  . cloNIDine (CATAPRES - DOSED IN MG/24 HR) 0.2 mg/24hr patch Place 1 patch (0.2 mg total) onto the skin once a week.  4 patch  12  . diltiazem (CARDIZEM CD) 180 MG 24 hr capsule Take 180 mg by mouth daily.      Marland Kitchen LORazepam (ATIVAN) 2 MG tablet Take 2 mg by mouth at bedtime.      . metoprolol succinate (TOPROL-XL) 50 MG 24 hr tablet Take 1 tablet (50 mg total) by mouth daily. Take with or immediately following a meal.  30 tablet  5  . nitroGLYCERIN (NITROSTAT) 0.4 MG SL tablet Place 1 tablet (0.4 mg total) under the tongue every 5 (five) minutes as needed.  25 tablet  6  . omeprazole (PRILOSEC) 20 MG capsule Take 20 mg by mouth daily.      . pregabalin (LYRICA) 75 MG capsule Take 75 mg by mouth 2 (two) times daily.      . simvastatin (ZOCOR) 40 MG tablet Take 40 mg by mouth daily.      . tadalafil (CIALIS) 5 MG tablet Take 5 mg by mouth daily.        No current facility-administered medications for this visit.    Discontinued Meds:   There are no discontinued medications.  Patient Active Problem List   Diagnosis Date Noted  . HYPERLIPIDEMIA 02/10/2010  . HYPERTENSION 03/24/2009  . ATHEROSCLEROTIC CARDIOVASCULAR DISEASE 03/24/2009  . DEGENERATIVE JOINT DISEASE 03/24/2009  . HYPERGLYCEMIA 03/24/2009    LABS    Component Value Date/Time   NA 138 08/26/2012 0820   NA 137 11/08/2011 0830   NA 137 10/09/2011 2012   K 4.2 08/26/2012 0820   K 3.7 11/08/2011 0830   K 3.8 10/09/2011 2012   CL 105 08/26/2012 0820   CL 102 11/08/2011 0830   CL 102 10/09/2011 2012   CO2 24 08/26/2012 0820   CO2 24 11/08/2011 0830   CO2 24 10/09/2011 2012   GLUCOSE 101* 08/26/2012 0820   GLUCOSE 132* 11/08/2011 0830   GLUCOSE 132* 10/09/2011 2012   BUN 17 08/26/2012 0820   BUN 13 11/08/2011 0830   BUN 10  10/09/2011 2012   CREATININE 1.15 08/26/2012 0820   CREATININE 0.92 11/08/2011 0830   CREATININE 1.01 10/09/2011 2012   CREATININE 1.03 01/22/2011 1445   CREATININE 1.01 03/23/2009   CALCIUM 9.7 08/26/2012 0820   CALCIUM 9.3 11/08/2011 0830   CALCIUM 9.7 10/09/2011 2012   GFRNONAA 78* 11/08/2011 0830   GFRNONAA 68* 10/09/2011 2012   GFRNONAA >60 10/13/2008 1229   GFRAA 90* 11/08/2011 0830   GFRAA 79* 10/09/2011 2012   GFRAA  Value: >60        The eGFR has been calculated using the MDRD equation. This calculation has not been validated in all clinical situations. eGFR's persistently <60 mL/min signify possible Chronic Kidney Disease. 10/13/2008 1229   CMP     Component Value Date/Time   NA 138 08/26/2012 0820   K 4.2 08/26/2012 0820   CL 105 08/26/2012 0820   CO2 24 08/26/2012 0820   GLUCOSE 101* 08/26/2012 0820   BUN 17 08/26/2012 0820   CREATININE 1.15 08/26/2012 0820   CREATININE 0.92 11/08/2011 0830   CALCIUM 9.7 08/26/2012 0820   PROT 7.3 01/22/2011 1445   ALBUMIN 4.6 01/22/2011 1445   AST 26 01/22/2011 1445   ALT 23 01/22/2011 1445   ALKPHOS 58 01/22/2011 1445   BILITOT  0.4 01/22/2011 1445   GFRNONAA 78* 11/08/2011 0830   GFRAA 90* 11/08/2011 0830       Component Value Date/Time   WBC 4.9 11/08/2011 0830   WBC 3.6* 10/09/2011 2008   WBC 3.5* 01/22/2011 1447   HGB 13.3 11/08/2011 0830   HGB 13.6 10/09/2011 2008   HGB 14.2 01/22/2011 1447   HCT 37.9* 11/08/2011 0830   HCT 40.2 10/09/2011 2008   HCT 42.3 01/22/2011 1447   MCV 94.3 11/08/2011 0830   MCV 95.5 10/09/2011 2008   MCV 97.5 01/22/2011 1447    Lipid Panel     Component Value Date/Time   CHOL 143 06/14/2009   TRIG 76 06/14/2009   HDL 49 06/14/2009   LDLCALC 79 06/14/2009    ABG No results found for this basename: phart, pco2, pco2art, po2, po2art, hco3, tco2, acidbasedef, o2sat     Lab Results  Component Value Date   TSH 1.499 01/22/2011   BNP (last 3 results) No results found for this basename: PROBNP,  in the last 8760 hours Cardiac Panel (last 3 results) No results found for this basename: CKTOTAL, CKMB, TROPONINI, RELINDX,  in the last 72 hours  Iron/TIBC/Ferritin No results found for this basename: iron, tibc, ferritin     EKG Orders placed in visit on 09/03/12  . EKG 12-LEAD     Prior Assessment and Plan Problem List as of 09/03/2012   HYPERLIPIDEMIA   Last Assessment & Plan   07/30/2012 Office Visit Written 07/30/2012  4:52 PM by Kathlen Brunswick, MD     Most recent lipid profile was excellent in 2011. We will verify current lipid-lowering medication and reassess.    HYPERTENSION   Last Assessment & Plan   07/30/2012 Office Visit Written 07/30/2012  4:53 PM by Kathlen Brunswick, MD     Blood pressure control has been generally inadequate although BP determination of his most recent office visit was excellent. We will attempt to reestablish an effective regime and will see him frequently until that is accomplished.    ATHEROSCLEROTIC CARDIOVASCULAR DISEASE   Last Assessment & Plan   07/30/2012 Office Visit Written 07/30/2012  4:50 PM by Kathlen Brunswick, MD  Very stable course  with coronary artery disease. We will continue to optimally control cardiovascular risk factors.    DEGENERATIVE JOINT DISEASE   HYPERGLYCEMIA   Last Assessment & Plan   07/30/2012 Office Visit Edited 07/30/2012  4:51 PM by Kathlen Brunswick, MD     Currently requiring no medication for borderline diabetes. No FBG >135 during the past 5 years.        Imaging: No results found.

## 2012-09-03 NOTE — Assessment & Plan Note (Addendum)
Blood pressure control is now good. Patient is encouraged to collect additional determinations either in medical settings were in local pharmacies prior to his next office visit in 6 months. He has been referred to Financial Counseling to determine if discounts can be offered by the healthcare system.

## 2012-09-03 NOTE — Progress Notes (Signed)
Patient ID: Glenn Brooks, male   DOB: [birth year], 79 y.o.   MRN: [id number]  HPI: Schedule return visit for continued assessment and treatment of hypertension. Patient reports difficulties with finances, which impacts compliance with appointments. He is not inclined to be seen if he does not have copayment in cash. He has been taking medications as requested, but has not monitored blood pressure. He reports a good energy level with no cardiopulmonary symptoms.  Current Outpatient Prescriptions  Medication Sig Dispense Refill  . aspirin 81 MG tablet Take 81 mg by mouth daily.        . celecoxib (CELEBREX) 200 MG capsule Take 200 mg by mouth daily.       . chlorthalidone (HYGROTON) 25 MG tablet Take 0.5 tablets (12.5 mg total) by mouth daily.  30 tablet  5  . cloNIDine (CATAPRES - DOSED IN MG/24 HR) 0.2 mg/24hr patch Place 1 patch (0.2 mg total) onto the skin once a week.  4 patch  12  . diltiazem (CARDIZEM CD) 180 MG 24 hr capsule Take 180 mg by mouth daily.      Marland Kitchen LORazepam (ATIVAN) 2 MG tablet Take 2 mg by mouth at bedtime.      . metoprolol succinate (TOPROL-XL) 50 MG 24 hr tablet Take 1 tablet (50 mg total) by mouth daily. Take with or immediately following a meal.  30 tablet  5  . nitroGLYCERIN (NITROSTAT) 0.4 MG SL tablet Place 1 tablet (0.4 mg total) under the tongue every 5 (five) minutes as needed.  25 tablet  6  . omeprazole (PRILOSEC) 20 MG capsule Take 20 mg by mouth daily.      . pregabalin (LYRICA) 75 MG capsule Take 75 mg by mouth 2 (two) times daily.      . simvastatin (ZOCOR) 40 MG tablet Take 40 mg by mouth daily.      . tadalafil (CIALIS) 5 MG tablet Take 5 mg by mouth daily.       No current facility-administered medications for this visit.   No Known Allergies   Past medical history, social history, and family history reviewed and updated.  PHYSICAL EXAM: BP 131/71  Pulse 75  Ht 5\' 7"  (1.702 m)  Wt 86.75 kg (191 lb 4 oz)  BMI 29.95 kg/m2;  Body mass index is 29.95  kg/(m^2). General-Well developed; no acute distress Body habitus-moderately overweight Neck-No JVD; no carotid bruits Lungs-clear lung fields; resonant to percussion Cardiovascular-normal PMI; normal S1 and S2; modest systolic murmur Abdomen-normal bowel sounds; soft and non-tender without masses or organomegaly Musculoskeletal-No deformities, no cyanosis or clubbing Neurologic-Normal cranial nerves; symmetric strength and tone Skin-Warm, no significant lesions Extremities-distal pulses intact; no edema  [place] Bing, MD 09/03/2012  1:00 PM  ASSESSMENT AND PLAN

## 2012-09-03 NOTE — Patient Instructions (Addendum)
Your physician recommends that you schedule a follow-up appointment in: 6 MONTHS  Your physician recommends that you return for lab work in: 4 months (bmet) Your physician recommends that you have follow up lab work, we will mail you a reminder letter to alert you when to go [employer], located across the street from our office.

## 2012-09-03 NOTE — Assessment & Plan Note (Addendum)
Patient does not appear to have frank diabetes.  An A1c level will be obtained with his next blood draw.

## 2012-09-03 NOTE — Assessment & Plan Note (Signed)
Excellent control of hyperlipidemia when last assessed. We will repeat a lipid profile with his next blood draw.

## 2012-09-05 NOTE — Addendum Note (Signed)
Addended by: Thompson Grayer on: 09/05/2012 04:05 PM   Modules accepted: Orders

## 2012-10-09 ENCOUNTER — Other Ambulatory Visit: Payer: Self-pay | Admitting: Cardiology

## 2012-12-04 ENCOUNTER — Emergency Department (HOSPITAL_COMMUNITY)
Admission: EM | Admit: 2012-12-04 | Discharge: 2012-12-04 | Disposition: A | Discharge status: Home / Self Care | Payer: Medicare Other | Attending: Emergency Medicine | Admitting: Emergency Medicine

## 2012-12-04 ENCOUNTER — Encounter (HOSPITAL_COMMUNITY): Payer: Self-pay | Admitting: *Deleted

## 2012-12-04 DIAGNOSIS — K0889 Other specified disorders of teeth and supporting structures: Secondary | ICD-10-CM

## 2012-12-04 DIAGNOSIS — Z860100 Personal history of colon polyps, unspecified: Secondary | ICD-10-CM | POA: Insufficient documentation

## 2012-12-04 DIAGNOSIS — Z79899 Other long term (current) drug therapy: Secondary | ICD-10-CM | POA: Insufficient documentation

## 2012-12-04 DIAGNOSIS — Z8679 Personal history of other diseases of the circulatory system: Secondary | ICD-10-CM | POA: Insufficient documentation

## 2012-12-04 DIAGNOSIS — Z862 Personal history of diseases of the blood and blood-forming organs and certain disorders involving the immune mechanism: Secondary | ICD-10-CM | POA: Insufficient documentation

## 2012-12-04 DIAGNOSIS — I10 Essential (primary) hypertension: Secondary | ICD-10-CM | POA: Insufficient documentation

## 2012-12-04 DIAGNOSIS — Z87448 Personal history of other diseases of urinary system: Secondary | ICD-10-CM | POA: Insufficient documentation

## 2012-12-04 DIAGNOSIS — R079 Chest pain, unspecified: Secondary | ICD-10-CM | POA: Insufficient documentation

## 2012-12-04 DIAGNOSIS — E669 Obesity, unspecified: Secondary | ICD-10-CM | POA: Insufficient documentation

## 2012-12-04 DIAGNOSIS — Z8639 Personal history of other endocrine, nutritional and metabolic disease: Secondary | ICD-10-CM | POA: Insufficient documentation

## 2012-12-04 DIAGNOSIS — Z8739 Personal history of other diseases of the musculoskeletal system and connective tissue: Secondary | ICD-10-CM | POA: Insufficient documentation

## 2012-12-04 DIAGNOSIS — E785 Hyperlipidemia, unspecified: Secondary | ICD-10-CM | POA: Insufficient documentation

## 2012-12-04 DIAGNOSIS — Z7982 Long term (current) use of aspirin: Secondary | ICD-10-CM | POA: Insufficient documentation

## 2012-12-04 DIAGNOSIS — K089 Disorder of teeth and supporting structures, unspecified: Secondary | ICD-10-CM | POA: Insufficient documentation

## 2012-12-04 MED ORDER — AMOXICILLIN 500 MG PO CAPS: 500.0000 mg | ORAL_CAPSULE | Freq: Three times a day (TID) | ORAL | Status: DC

## 2012-12-04 MED ORDER — HYDROCODONE-ACETAMINOPHEN 5-325 MG PO TABS: 1.0000 | ORAL_TABLET | ORAL | Status: DC | PRN

## 2012-12-04 NOTE — ED Provider Notes (Signed)
Medical screening examination/treatment/procedure(s) were performed by non-physician practitioner and as supervising physician I was immediately available for consultation/collaboration.  Geoffery Lyons, MD 12/04/12 [phone]

## 2012-12-04 NOTE — ED Provider Notes (Signed)
CSN: [id number]     Arrival date & time 12/04/12  1524 History   First MD Initiated Contact with Patient 12/04/12 1632     Chief Complaint  Patient presents with  . Dental Pain   (Consider location/radiation/quality/duration/timing/severity/associated sxs/prior Treatment) Patient is a 79 y.o. male presenting with tooth pain. The history is provided by the patient.  Dental Pain Location:  Lower Quality:  Aching Severity:  Moderate Onset quality:  Gradual Timing:  Intermittent Progression:  Worsening Chronicity:  New Context comment:  Pt recently had teeth pulled and now noted a piece of the tooth still in his mouth. Previous work-up:  Dental exam (extraction) Relieved by:  Nothing Worsened by:  Nothing tried Associated symptoms: gum swelling   Associated symptoms: no difficulty swallowing and no drooling   Risk factors: no cancer, no chewing tobacco use and no diabetes     Past Medical History  Diagnosis Date  . ASCVD (arteriosclerotic cardiovascular disease) 1993    Status post MI and percutaneous intervention  . Arteriosclerotic cardiovascular disease (ASCVD) 1993    Acute MI->PCI in 1993; asymptomatic PVCs  . Degenerative joint disease     shoulder  . ED (erectile dysfunction)     Secondary to medication  . Hyperlipidemia   . Hyperglycemia   . Hypertension   . Testosterone deficiency     Erectile dysfunction  . Obesity   . Colonic polyp     excised via colonoscope   Past Surgical History  Procedure Laterality Date  . Knee arthroscopy      Arthroscopic surgery x 2  . Cataract extraction, bilateral    . Transurethral resection of bladder    . Shoulder surgery    . Colonoscopy w/ polypectomy  03/2008   No family history on file. History  Substance Use Topics  . Smoking status: Never Smoker   . Smokeless tobacco: Never Used  . Alcohol Use: No     Comment: Remote excessive use    Review of Systems  Constitutional:       Obesity  HENT: Negative for  drooling.   Cardiovascular: Positive for chest pain.  Musculoskeletal: Positive for arthralgias.    Allergies  Review of patient's allergies indicates no known allergies.  Home Medications   Current Outpatient Rx  Name  Route  Sig  Dispense  Refill  . aspirin 81 MG tablet   Oral   Take 81 mg by mouth daily.           Marland Kitchen CARTIA XT 180 MG 24 hr capsule      TAKE 1 CAPSULE BY MOUTH EVERY DAY   30 capsule   3   . celecoxib (CELEBREX) 200 MG capsule   Oral   Take 200 mg by mouth daily.          . chlorthalidone (HYGROTON) 25 MG tablet   Oral   Take 0.5 tablets (12.5 mg total) by mouth daily.   30 tablet   5   . cloNIDine (CATAPRES - DOSED IN MG/24 HR) 0.2 mg/24hr patch   Transdermal   Place 1 patch (0.2 mg total) onto the skin once a week.   4 patch   12   . diltiazem (CARDIZEM CD) 180 MG 24 hr capsule   Oral   Take 180 mg by mouth daily.         Marland Kitchen LORazepam (ATIVAN) 2 MG tablet   Oral   Take 2 mg by mouth at bedtime.         Marland Kitchen  metoprolol succinate (TOPROL-XL) 50 MG 24 hr tablet   Oral   Take 1 tablet (50 mg total) by mouth daily. Take with or immediately following a meal.   30 tablet   5   . nitroGLYCERIN (NITROSTAT) 0.4 MG SL tablet   Sublingual   Place 1 tablet (0.4 mg total) under the tongue every 5 (five) minutes as needed.   25 tablet   6   . omeprazole (PRILOSEC) 20 MG capsule   Oral   Take 20 mg by mouth daily.         . pregabalin (LYRICA) 75 MG capsule   Oral   Take 75 mg by mouth 2 (two) times daily.         . simvastatin (ZOCOR) 40 MG tablet   Oral   Take 40 mg by mouth daily.         . tadalafil (CIALIS) 5 MG tablet   Oral   Take 5 mg by mouth daily.          BP 150/64  Pulse 93  Temp(Src) 98.4 F (36.9 C) (Oral)  Resp 20  Ht 5\' 7"  (1.702 m)  Wt 180 lb (81.647 kg)  BMI 28.19 kg/m2  SpO2 99% Physical Exam  Nursing note and vitals reviewed. Constitutional: He is oriented to person, place, and time. He appears  well-developed and well-nourished.  Non-toxic appearance.  HENT:  Head: Normocephalic.  Right Ear: Tympanic membrane and external ear normal.  Left Ear: Tympanic membrane and external ear normal.  Mouth/Throat:    Eyes: EOM and lids are normal. Pupils are equal, round, and reactive to light.  Neck: Normal range of motion. Neck supple. Carotid bruit is not present.  Cardiovascular: Normal rate, regular rhythm, normal heart sounds, intact distal pulses and normal pulses.   Pulmonary/Chest: Breath sounds normal. No respiratory distress.  Abdominal: Soft. Bowel sounds are normal. There is no tenderness. There is no guarding.  Musculoskeletal: Normal range of motion.  Lymphadenopathy:       Head (right side): No submandibular adenopathy present.       Head (left side): No submandibular adenopathy present.    He has no cervical adenopathy.  Neurological: He is alert and oriented to person, place, and time. He has normal strength. No cranial nerve deficit or sensory deficit.  Skin: Skin is warm and dry.  Psychiatric: He has a normal mood and affect. His speech is normal.    ED Course  Procedures (including critical care time) Labs Review Labs Reviewed - No data to display Imaging Review No results found.  MDM  No diagnosis found. **I have reviewed nursing notes, vital signs, and all appropriate lab and imaging results for this patient.  Pt has a cavity of the right lower premolar. Swelling around the gum. No signs of Ludwig's angina. Plan- Rx for amoxil and norco. [facility].  Kathie Dike, PA-C 12/04/12 [phone]

## 2012-12-04 NOTE — ED Notes (Signed)
Pain in right lower jaw, states he recently had teeth pulled and states the dentist left part of his tooth in his mouth

## 2013-01-05 ENCOUNTER — Other Ambulatory Visit: Payer: Self-pay | Admitting: *Deleted

## 2013-01-05 ENCOUNTER — Encounter: Payer: Self-pay | Admitting: *Deleted

## 2013-01-05 DIAGNOSIS — I10 Essential (primary) hypertension: Secondary | ICD-10-CM

## 2013-01-05 DIAGNOSIS — R7309 Other abnormal glucose: Secondary | ICD-10-CM

## 2013-01-05 DIAGNOSIS — I2510 Atherosclerotic heart disease of native coronary artery without angina pectoris: Secondary | ICD-10-CM

## 2013-01-05 DIAGNOSIS — E785 Hyperlipidemia, unspecified: Secondary | ICD-10-CM

## 2013-01-09 ENCOUNTER — Encounter: Payer: Self-pay | Admitting: *Deleted

## 2013-01-12 LAB — BASIC METABOLIC PANEL
BUN: 22 mg/dL (ref 6–23)
CO2: 28 mEq/L (ref 19–32)
Calcium: 10 mg/dL (ref 8.4–10.5)
Creat: 1.44 mg/dL — ABNORMAL HIGH (ref 0.50–1.35)

## 2013-01-12 LAB — LIPID PANEL
HDL: 35 mg/dL — ABNORMAL LOW (ref >39)
LDL Cholesterol: 110 mg/dL — ABNORMAL HIGH (ref 0–99)
Triglycerides: 140 mg/dL (ref <150)

## 2013-01-12 LAB — HEMOGLOBIN A1C: Hgb A1c MFr Bld: 5.9 % — ABNORMAL HIGH (ref <5.7)

## 2013-01-15 NOTE — Addendum Note (Signed)
Addended by: Thompson Grayer on: 01/15/2013 05:31 PM   Modules accepted: Orders, Medications

## 2013-03-04 ENCOUNTER — Ambulatory Visit: Payer: Medicare Other | Admitting: Cardiovascular Disease

## 2013-03-04 ENCOUNTER — Encounter: Payer: Self-pay | Admitting: Cardiovascular Disease

## 2013-03-12 ENCOUNTER — Encounter: Payer: Self-pay | Admitting: Internal Medicine

## 2013-03-18 ENCOUNTER — Ambulatory Visit (INDEPENDENT_AMBULATORY_CARE_PROVIDER_SITE_OTHER): Payer: Medicare Other | Admitting: Cardiovascular Disease

## 2013-03-18 ENCOUNTER — Encounter: Payer: Self-pay | Admitting: Cardiovascular Disease

## 2013-03-18 VITALS — BP 152/85 | HR 86 | Ht 67.0 in | Wt 199.0 lb

## 2013-03-18 DIAGNOSIS — I2510 Atherosclerotic heart disease of native coronary artery without angina pectoris: Secondary | ICD-10-CM

## 2013-03-18 DIAGNOSIS — M129 Arthropathy, unspecified: Secondary | ICD-10-CM

## 2013-03-18 DIAGNOSIS — E785 Hyperlipidemia, unspecified: Secondary | ICD-10-CM

## 2013-03-18 DIAGNOSIS — I252 Old myocardial infarction: Secondary | ICD-10-CM

## 2013-03-18 DIAGNOSIS — R0602 Shortness of breath: Secondary | ICD-10-CM

## 2013-03-18 DIAGNOSIS — M1990 Unspecified osteoarthritis, unspecified site: Secondary | ICD-10-CM

## 2013-03-18 DIAGNOSIS — I10 Essential (primary) hypertension: Secondary | ICD-10-CM

## 2013-03-18 MED ORDER — SIMVASTATIN 80 MG PO TABS: 80.0000 mg | ORAL_TABLET | Freq: Every day | ORAL | Status: DC

## 2013-03-18 MED ORDER — METOPROLOL SUCCINATE ER 25 MG PO TB24: 75.0000 mg | ORAL_TABLET | Freq: Every day | ORAL | Status: DC

## 2013-03-18 NOTE — Patient Instructions (Addendum)
Your physician recommends that you schedule a follow-up appointment in: 4-6 weeks  Your physician has recommended you make the following change in your medication:  1. Increased Metoprolol to 75 mg daily. 2. Increased Simvastatin to 80 mg daily.   Your physician recommends that you return for lab work  3 weeks for LFT's 3 months for LIPID PANEL   You have been referred to Physical Therapy

## 2013-03-18 NOTE — Progress Notes (Signed)
Patient ID: Glenn Brooks, male   DOB: 1929/09/03, 79 y.o.   MRN: [id number]      SUBJECTIVE: The patient is an 79 year old male with a history of coronary artery disease and myocardial infarction in the past, CVA, hypertension, and hyperlipidemia. He gets short of breath if he exerts himself too much, but he says this has been stable for several years and has not progressed in intensity. He denies leg swelling, orthopnea, paroxysmal nocturnal dyspnea, lightheadedness, dizziness, syncope and palpitations. He has diffuse arthritis of his knees, arms, neck, and chest wall. A stress test (results noted below) revealed infarct without ischemia. He wants to know why he gets short of breath.  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 Moved to [place] 7 years ago to be near grandchildren.   No Known Allergies  Current Outpatient Prescriptions  Medication Sig Dispense Refill  . aspirin EC 81 MG tablet Take 81 mg by mouth daily.      . celecoxib (CELEBREX) 200 MG capsule Take 200 mg by mouth daily.       . cloNIDine (CATAPRES - DOSED IN MG/24 HR) 0.2 mg/24hr patch Place 1 patch (0.2 mg total) onto the skin once a week.  4 patch  12  . diltiazem (CARTIA XT) 180 MG 24 hr capsule Take 180 mg by mouth daily.      Marland Kitchen HYDROcodone-acetaminophen (NORCO/VICODIN) 5-325 MG per tablet Take 1 tablet by mouth every 4 (four) hours as needed for pain.  20 tablet  0  . LORazepam (ATIVAN) 1 MG tablet Take 1 mg by mouth at bedtime.      . metoprolol succinate (TOPROL-XL) 50 MG 24 hr tablet Take 1 tablet (50 mg total) by mouth daily. Take with or immediately following a meal.  30 tablet  5  . nitroGLYCERIN (NITROSTAT) 0.4 MG SL tablet Place 1 tablet (0.4 mg total) under the tongue every 5 (five) minutes as needed.  25 tablet  6  . omeprazole (PRILOSEC) 20 MG capsule Take 20 mg by mouth daily.      . pregabalin (LYRICA) 75 MG capsule Take 75 mg by mouth 2 (two) times daily.        . simvastatin (ZOCOR) 40 MG tablet Take 40 mg by mouth daily.      . tadalafil (CIALIS) 5 MG tablet Take 5 mg by mouth daily as needed for erectile dysfunction.       No current facility-administered medications for this visit.    Past Medical History  Diagnosis Date  . ASCVD (arteriosclerotic cardiovascular disease) 1993    Status post MI and percutaneous intervention  . Arteriosclerotic cardiovascular disease (ASCVD) 1993    Acute MI->PCI in 1993; asymptomatic PVCs  . Degenerative joint disease     shoulder  . ED (erectile dysfunction)     Secondary to medication  . Hyperlipidemia   . Hyperglycemia   . Hypertension   . Testosterone deficiency     Erectile dysfunction  . Obesity   . Colonic polyp     excised via colonoscope    Past Surgical History  Procedure Laterality Date  . Knee arthroscopy      Arthroscopic surgery x 2  . Cataract extraction, bilateral    . Transurethral resection of bladder    . Shoulder surgery    . Colonoscopy w/ polypectomy  03/2008    History   Social History  . Marital Status: Married    Spouse Name:  N/A    Number of Children: 2  . Years of Education: N/A   Occupational History  . [employer]     Retired   Social History Main Topics  . Smoking status: Never Smoker   . Smokeless tobacco: Never Used  . Alcohol Use: No     Comment: Remote excessive use  . Drug Use: No  . Sexual Activity: Not on file   Other Topics Concern  . Not on file   Social History Narrative  . No narrative on file     Filed Vitals:   03/18/13 0821  BP: 152/85  Pulse: 86  Height: 5\' 7"  (1.702 m)  Weight: 199 lb (90.266 kg)    PHYSICAL EXAM General: NAD Neck: No JVD, no thyromegaly or thyroid nodule.  Lungs: Clear to auscultation bilaterally with normal respiratory effort. CV: Nondisplaced PMI.  Heart regular S1/S2, no S3/S4, I/VI holosystolic murmur along left sternal border.  No peripheral edema.  No carotid bruit.  Normal pedal pulses.   Abdomen: Soft, nontender, no hepatosplenomegaly, no distention.  Neurologic: Alert and oriented x 3.  Psych: Normal affect. Extremities: No clubbing or cyanosis. Bilateral knee swelling. Arthritic changes in metacarpal joints.  ECG: reviewed and available in electronic records.   Nuclear stress test (01/2012): IMPRESSION: Abnormal stress nuclear myocardial study revealing significant ST- segment depression in the anterolateral leads in the absence of angina or other apparent ischemic symptoms, mild left ventricular dilatation, a modest segmental wall motion abnormality with preserved overall left ventricular systolic function and scintigraphic evidence for previous inferolateral infarction. No myocardial ischemia apparent by imaging.     ASSESSMENT AND PLAN: 1. Dyspnea on exertion in the setting of known CAD: While this has not progressed in intensity, he continues to experience dyspnea with exertion. Given his known history of myocardial infarction and coronary artery disease with prior percutaneous coronary intervention, I will increase his metoprolol to 75 mg daily to see if this helps to alleviate his symptoms. I may consider nitrates in the future. His angina/anginal equivalent appears to be stable. His stress test from last year showed infarct but no ischemia. I may consider stress testing in the future. Increasing the metoprolol may help to decrease his BP as well. Continue ASA and simvastatin. 2. Hypertension: uncontrolled today. I plan to increase metoprolol to alleviate his dyspnea, and this may serve to decrease BP as well. Will monitor. 3. Hyperlipidemia: HDL 35 and LDL 110 in 12/2012. Will increase simvastatin to 80 mg daily and check LFT's in 3 weeks and repeat lipids in 3 months. 4. Arthritis: I will make a referral to physical therapy to see if they can assist him with joint mobility.  Dispo: f/u 4-6 weeks.  Prentice Docker, M.D., F.A.C.C.

## 2013-03-22 ENCOUNTER — Other Ambulatory Visit: Payer: Self-pay | Admitting: Cardiology

## 2013-04-20 ENCOUNTER — Encounter: Payer: Self-pay | Admitting: Cardiovascular Disease

## 2013-04-20 ENCOUNTER — Ambulatory Visit: Payer: Medicare Other | Admitting: Cardiovascular Disease

## 2013-05-01 ENCOUNTER — Other Ambulatory Visit: Payer: Self-pay | Admitting: Cardiology

## 2013-05-04 ENCOUNTER — Encounter (INDEPENDENT_AMBULATORY_CARE_PROVIDER_SITE_OTHER): Payer: Self-pay

## 2013-05-04 ENCOUNTER — Ambulatory Visit: Payer: Medicare HMO | Admitting: Gastroenterology

## 2013-05-20 ENCOUNTER — Ambulatory Visit: Payer: Medicare PPO | Admitting: Gastroenterology

## 2013-05-20 ENCOUNTER — Telehealth: Payer: Self-pay | Admitting: Gastroenterology

## 2013-05-20 NOTE — Telephone Encounter (Signed)
Pt was a no show

## 2013-05-28 LAB — HEPATIC FUNCTION PANEL
ALT: 19 U/L (ref 0–53)
AST: 25 U/L (ref 0–37)
Albumin: 4.7 g/dL (ref 3.5–5.2)
Alkaline Phosphatase: 64 U/L (ref 39–117)
Bilirubin, Direct: 0.1 mg/dL (ref 0.0–0.3)
Indirect Bilirubin: 0.4 mg/dL (ref 0.2–1.2)
Total Bilirubin: 0.5 mg/dL (ref 0.2–1.2)
Total Protein: 7.5 g/dL (ref 6.0–8.3)

## 2013-05-28 LAB — LIPID PANEL
Cholesterol: 117 mg/dL (ref 0–200)
HDL: 37 mg/dL — ABNORMAL LOW (ref >39)
LDL Cholesterol: 64 mg/dL (ref 0–99)
Total CHOL/HDL Ratio: 3.2 Ratio
Triglycerides: 82 mg/dL (ref <150)
VLDL: 16 mg/dL (ref 0–40)

## 2013-05-29 ENCOUNTER — Emergency Department (HOSPITAL_COMMUNITY): Payer: Medicare PPO

## 2013-05-29 ENCOUNTER — Emergency Department (HOSPITAL_COMMUNITY)
Admission: EM | Admit: 2013-05-29 | Discharge: 2013-05-29 | Disposition: A | Discharge status: Home / Self Care | Payer: Medicare PPO | Attending: Emergency Medicine | Admitting: Emergency Medicine

## 2013-05-29 ENCOUNTER — Telehealth: Payer: Self-pay

## 2013-05-29 ENCOUNTER — Encounter (HOSPITAL_COMMUNITY): Payer: Self-pay | Admitting: Emergency Medicine

## 2013-05-29 DIAGNOSIS — I2510 Atherosclerotic heart disease of native coronary artery without angina pectoris: Secondary | ICD-10-CM | POA: Insufficient documentation

## 2013-05-29 DIAGNOSIS — I252 Old myocardial infarction: Secondary | ICD-10-CM | POA: Insufficient documentation

## 2013-05-29 DIAGNOSIS — R079 Chest pain, unspecified: Secondary | ICD-10-CM

## 2013-05-29 DIAGNOSIS — Z860100 Personal history of colon polyps, unspecified: Secondary | ICD-10-CM | POA: Insufficient documentation

## 2013-05-29 DIAGNOSIS — M1990 Unspecified osteoarthritis, unspecified site: Secondary | ICD-10-CM | POA: Insufficient documentation

## 2013-05-29 DIAGNOSIS — R1310 Dysphagia, unspecified: Secondary | ICD-10-CM | POA: Insufficient documentation

## 2013-05-29 DIAGNOSIS — E785 Hyperlipidemia, unspecified: Secondary | ICD-10-CM | POA: Insufficient documentation

## 2013-05-29 DIAGNOSIS — Z7982 Long term (current) use of aspirin: Secondary | ICD-10-CM | POA: Insufficient documentation

## 2013-05-29 DIAGNOSIS — I7090 Unspecified atherosclerosis: Secondary | ICD-10-CM | POA: Insufficient documentation

## 2013-05-29 DIAGNOSIS — R0789 Other chest pain: Secondary | ICD-10-CM | POA: Insufficient documentation

## 2013-05-29 DIAGNOSIS — E669 Obesity, unspecified: Secondary | ICD-10-CM | POA: Insufficient documentation

## 2013-05-29 DIAGNOSIS — I10 Essential (primary) hypertension: Secondary | ICD-10-CM | POA: Insufficient documentation

## 2013-05-29 DIAGNOSIS — Z791 Long term (current) use of non-steroidal anti-inflammatories (NSAID): Secondary | ICD-10-CM | POA: Insufficient documentation

## 2013-05-29 DIAGNOSIS — Z79899 Other long term (current) drug therapy: Secondary | ICD-10-CM | POA: Insufficient documentation

## 2013-05-29 DIAGNOSIS — N529 Male erectile dysfunction, unspecified: Secondary | ICD-10-CM | POA: Insufficient documentation

## 2013-05-29 LAB — COMPREHENSIVE METABOLIC PANEL
ALT: 20 U/L (ref 0–53)
AST: 29 U/L (ref 0–37)
Albumin: 4.3 g/dL (ref 3.5–5.2)
Alkaline Phosphatase: 66 U/L (ref 39–117)
BUN: 11 mg/dL (ref 6–23)
CALCIUM: 9.3 mg/dL (ref 8.4–10.5)
CO2: 25 meq/L (ref 19–32)
CREATININE: 1.08 mg/dL (ref 0.50–1.35)
Chloride: 104 mEq/L (ref 96–112)
GFR, EST AFRICAN AMERICAN: 72 mL/min — AB (ref >90)
GFR, EST NON AFRICAN AMERICAN: 62 mL/min — AB (ref >90)
GLUCOSE: 104 mg/dL — AB (ref 70–99)
Potassium: 4.3 mEq/L (ref 3.7–5.3)
Sodium: 140 mEq/L (ref 137–147)
Total Bilirubin: 0.2 mg/dL — ABNORMAL LOW (ref 0.3–1.2)
Total Protein: 8 g/dL (ref 6.0–8.3)

## 2013-05-29 LAB — CBC WITH DIFFERENTIAL/PLATELET
Basophils Absolute: 0 10*3/uL (ref 0.0–0.1)
Basophils Relative: 0 % (ref 0–1)
EOS PCT: 4 % (ref 0–5)
Eosinophils Absolute: 0.1 10*3/uL (ref 0.0–0.7)
HEMATOCRIT: 41 % (ref 39.0–52.0)
Hemoglobin: 14 g/dL (ref 13.0–17.0)
LYMPHS ABS: 1.9 10*3/uL (ref 0.7–4.0)
LYMPHS PCT: 52 % — AB (ref 12–46)
MCH: 33.3 pg (ref 26.0–34.0)
MCHC: 34.1 g/dL (ref 30.0–36.0)
MCV: 97.6 fL (ref 78.0–100.0)
MONO ABS: 0.3 10*3/uL (ref 0.1–1.0)
Monocytes Relative: 8 % (ref 3–12)
Neutro Abs: 1.3 10*3/uL — ABNORMAL LOW (ref 1.7–7.7)
Neutrophils Relative %: 36 % — ABNORMAL LOW (ref 43–77)
Platelets: 173 10*3/uL (ref 150–400)
RBC: 4.2 MIL/uL — AB (ref 4.22–5.81)
RDW: 13.6 % (ref 11.5–15.5)
WBC: 3.6 10*3/uL — AB (ref 4.0–10.5)

## 2013-05-29 LAB — TROPONIN I: Troponin I: <0.3 ng/mL (ref <0.30)

## 2013-05-29 MED ORDER — OXYCODONE-ACETAMINOPHEN 5-325 MG PO TABS
1.0000 | ORAL_TABLET | ORAL | Status: DC | PRN
Start: 2013-05-29 — End: 2013-06-08

## 2013-05-29 MED ORDER — OMEPRAZOLE 20 MG PO CPDR: 20.0000 mg | DELAYED_RELEASE_CAPSULE | Freq: Every day | ORAL | Status: DC

## 2013-05-29 MED ORDER — OXYCODONE-ACETAMINOPHEN 5-325 MG PO TABS
1.0000 | ORAL_TABLET | Freq: Once | ORAL | Status: AC
  Administered 2013-05-29: 1 via ORAL
  Filled 2013-05-29: qty 1

## 2013-05-29 MED ORDER — ONDANSETRON HCL 4 MG PO TABS: 4.0000 mg | ORAL_TABLET | Freq: Four times a day (QID) | ORAL | Status: DC

## 2013-05-29 MED ORDER — ONDANSETRON 4 MG PO TBDP
4.0000 mg | ORAL_TABLET | Freq: Once | ORAL | Status: AC
  Administered 2013-05-29: 4 mg via ORAL
  Filled 2013-05-29: qty 1

## 2013-05-29 NOTE — ED Notes (Signed)
Patient with no complaints at this time. Respirations even and unlabored. Skin warm/dry. Discharge instructions reviewed with patient at this time. Patient given opportunity to voice concerns/ask questions. Patient discharged at this time and left Emergency Department with steady gait.   

## 2013-05-29 NOTE — ED Provider Notes (Signed)
TIME SEEN: 8:40 PM  CHIEF COMPLAINT: Chest pain and epigastric pain with swallowing  HPI: Patient is an 80 year old male with a history of cardiac disease status post MI, hyperlipidemia, hypertension who presents emergency department with 3 weeks of pain with swallowing. He reports that he is having worsening pain now with drinking fluids. Initially started with eating solids. He is able to swallow but only after taking large amounts of pain medication. He denies that this feels like his prior MI. He denies any shortness of breath, nausea vomiting, diarrhea, bloody stools or melena, diaphoresis or dizziness. He has an appointment with Tana Coast, PA at [facility] on 06/08/13. Denies a prior history of esophageal varices, esophageal stricture, endoscopy or colonoscopy.  ROS: See HPI Constitutional: no fever  Eyes: no drainage  ENT: no runny nose   Cardiovascular:  chest pain  Resp: no SOB  GI: no vomiting GU: no dysuria Integumentary: no rash  Allergy: no hives  Musculoskeletal: no leg swelling  Neurological: no slurred speech ROS otherwise negative  PAST MEDICAL HISTORY/PAST SURGICAL HISTORY:  Past Medical History  Diagnosis Date  . ASCVD (arteriosclerotic cardiovascular disease) 1993    Status post MI and percutaneous intervention  . Arteriosclerotic cardiovascular disease (ASCVD) 1993    Acute MI->PCI in 1993; asymptomatic PVCs  . Degenerative joint disease     shoulder  . ED (erectile dysfunction)     Secondary to medication  . Hyperlipidemia   . Hyperglycemia   . Hypertension   . Testosterone deficiency     Erectile dysfunction  . Obesity   . Colonic polyp     excised via colonoscope    MEDICATIONS:  Prior to Admission medications   Medication Sig Start Date End Date Taking? Authorizing Provider  aspirin EC 81 MG tablet Take 81 mg by mouth daily.    Historical Provider, MD  CARTIA XT 180 MG 24 hr capsule TAKE 1 CAPSULE BY MOUTH EVERY DAY 03/22/13   Laqueta Linden, MD  celecoxib (CELEBREX) 200 MG capsule Take 200 mg by mouth daily.     Historical Provider, MD  cloNIDine (CATAPRES - DOSED IN MG/24 HR) 0.2 mg/24hr patch Place 1 patch (0.2 mg total) onto the skin once a week. 07/30/12   Kathlen Brunswick, MD  diltiazem (CARTIA XT) 180 MG 24 hr capsule Take 180 mg by mouth daily.    Historical Provider, MD  HYDROcodone-acetaminophen (NORCO/VICODIN) 5-325 MG per tablet Take 1 tablet by mouth every 4 (four) hours as needed for pain. 12/04/12   Kathie Dike, PA-C  LORazepam (ATIVAN) 1 MG tablet Take 1 mg by mouth at bedtime.    Historical Provider, MD  metoprolol succinate (TOPROL-XL) 25 MG 24 hr tablet Take 3 tablets (75 mg total) by mouth daily. Take with or immediately following a meal. 03/18/13   Laqueta Linden, MD  naproxen (NAPROSYN) 500 MG tablet Take 500 mg by mouth 2 (two) times daily with a meal.    Historical Provider, MD  nitroGLYCERIN (NITROSTAT) 0.4 MG SL tablet Place 1 tablet (0.4 mg total) under the tongue every 5 (five) minutes as needed. 07/30/12   Kathlen Brunswick, MD  omeprazole (PRILOSEC) 20 MG capsule Take 20 mg by mouth daily.    Historical Provider, MD  pregabalin (LYRICA) 75 MG capsule Take 75 mg by mouth 2 (two) times daily.    Historical Provider, MD  simvastatin (ZOCOR) 80 MG tablet Take 1 tablet (80 mg total) by mouth daily. 03/18/13  Laqueta LindenSuresh A Koneswaran, MD  tadalafil (CIALIS) 5 MG tablet Take 5 mg by mouth daily as needed for erectile dysfunction.    Historical Provider, MD    ALLERGIES:  No Known Allergies  SOCIAL HISTORY:  History  Substance Use Topics  . Smoking status: Never Smoker   . Smokeless tobacco: Never Used  . Alcohol Use: No     Comment: Remote excessive use    FAMILY HISTORY: History reviewed. No pertinent family history.  EXAM: BP 161/79  Pulse 91  Temp(Src) 98.2 F (36.8 C) (Oral)  Resp 20  Ht 5\' 7"  (1.702 m)  Wt 180 lb (81.647 kg)  BMI 28.19 kg/m2  SpO2 97% CONSTITUTIONAL: Alert  and oriented and responds appropriately to questions. Well-appearing; well-nourished HEAD: Normocephalic EYES: Conjunctivae clear, PERRL ENT: normal nose; no rhinorrhea; moist mucous membranes; pharynx without lesions noted NECK: Supple, no meningismus, no LAD  CARD: RRR; S1 and S2 appreciated; no murmurs, no clicks, no rubs, no gallops RESP: Normal chest excursion without splinting or tachypnea; breath sounds clear and equal bilaterally; no wheezes, no rhonchi, no rales,  ABD/GI: Normal bowel sounds; non-distended; soft, non-tender, no rebound, no guarding BACK:  The back appears normal and is non-tender to palpation, there is no CVA tenderness EXT: Normal ROM in all joints; non-tender to palpation; no edema; normal capillary refill; no cyanosis    SKIN: Normal color for age and race; warm NEURO: Moves all extremities equally PSYCH: The patient's mood and manner are appropriate. Grooming and personal hygiene are appropriate.  MEDICAL DECISION MAKING: Patient here with pain only with swallowing. Suspect an esophageal cause. I am not concerned for esophageal rupture as patient appears very comfortable on exam, in the room reading a book. His labs are unremarkable. Abdominal labs normal. Abdominal exam benign. Troponin is negative and EKG shows no new ischemic changes. I do not feel that this is anginal in nature. I am not concerned for dissection or pulmonary embolus. We'll give pain medication and by mouth challenge patient in the emergency department. Have discussed with him that he is able to swallow, I feel he is safe to be discharged home and have an outpatient endoscopy. He agrees with this plan.  ED PROGRESS: Patient was able to swallow without difficulty. No vomiting or regurgitation of fluids. I feel he is safe to be discharged home. I do not feel this is in his anginal equivalent given he has had this pain for the past 2-3 weeks and he has no other associated symptoms and states this does  not feel like his prior chest pain with his cardiac disease. We'll have him followup with his primary care physician and gastroenterology as scheduled. Have given strict return precautions. Patient verbalizes understanding and is comfortable plan.    Date: 05/29/2013 9 and is:21 PM  Rate: 18:05  Rhythm: normal sinus rhythm  QRS Axis: normal  Intervals: RBBB, LAFB  ST/T Wave abnormalities: normal  Conduction Disutrbances: none  Narrative Interpretation: RBBB, LAFB, no new ischmic changes         Layla MawKristen N Ward, DO 05/29/13 2121

## 2013-05-29 NOTE — ED Notes (Signed)
Chest pain, has appt with GI doctor, hurts worse when tries to eat.No vomiting.

## 2013-05-29 NOTE — Telephone Encounter (Signed)
Pt came by the office this morning with abd pain and epigastric pain. He can swallow but it hurts to swallow. He miss his appointment last week. We made him a new appointment on 06/08/13 but he states he can not eat because of the pain. He had a tooth pulled and now is on anti-bx for the tooth and that is when the pain got worst. Please advise. I told him that if the pain gets worst to go to the ER to get checked out. He is going to call me when he gets home to see medication he is taking.

## 2013-05-29 NOTE — Telephone Encounter (Signed)
Communication noted. We have not seen him in 4 years which makes him a new patient. Agree with getting him in for appointment as soon as possible. If he cannot wait, then he should go to the ED

## 2013-05-29 NOTE — Discharge Instructions (Signed)
Chest Pain (Nonspecific) °It is often hard to give a specific diagnosis for the cause of chest pain. There is always a chance that your pain could be related to something serious, such as a heart attack or a blood clot in the lungs. You need to follow up with your caregiver for further evaluation. °CAUSES  °· Heartburn. °· Pneumonia or bronchitis. °· Anxiety or stress. °· Inflammation around your heart (pericarditis) or lung (pleuritis or pleurisy). °· A blood clot in the lung. °· A collapsed lung (pneumothorax). It can develop suddenly on its own (spontaneous pneumothorax) or from injury (trauma) to the chest. °· Shingles infection (herpes zoster virus). °The chest wall is composed of bones, muscles, and cartilage. Any of these can be the source of the pain. °· The bones can be bruised by injury. °· The muscles or cartilage can be strained by coughing or overwork. °· The cartilage can be affected by inflammation and become sore (costochondritis). °DIAGNOSIS  °Lab tests or other studies, such as X-rays, electrocardiography, stress testing, or cardiac imaging, may be needed to find the cause of your pain.  °TREATMENT  °· Treatment depends on what may be causing your chest pain. Treatment may include: °· Acid blockers for heartburn. °· Anti-inflammatory medicine. °· Pain medicine for inflammatory conditions. °· Antibiotics if an infection is present. °· You may be advised to change lifestyle habits. This includes stopping smoking and avoiding alcohol, caffeine, and chocolate. °· You may be advised to keep your head raised (elevated) when sleeping. This reduces the chance of acid going backward from your stomach into your esophagus. °· Most of the time, nonspecific chest pain will improve within 2 to 3 days with rest and mild pain medicine. °HOME CARE INSTRUCTIONS  °· If antibiotics were prescribed, take your antibiotics as directed. Finish them even if you start to feel better. °· For the next few days, avoid physical  activities that bring on chest pain. Continue physical activities as directed. °· Do not smoke. °· Avoid drinking alcohol. °· Only take over-the-counter or prescription medicine for pain, discomfort, or fever as directed by your caregiver. °· Follow your caregiver's suggestions for further testing if your chest pain does not go away. °· Keep any follow-up appointments you made. If you do not go to an appointment, you could develop lasting (chronic) problems with pain. If there is any problem keeping an appointment, you must call to reschedule. °SEEK MEDICAL CARE IF:  °· You think you are having problems from the medicine you are taking. Read your medicine instructions carefully. °· Your chest pain does not go away, even after treatment. °· You develop a rash with blisters on your chest. °SEEK IMMEDIATE MEDICAL CARE IF:  °· You have increased chest pain or pain that spreads to your arm, neck, jaw, back, or abdomen. °· You develop shortness of breath, an increasing cough, or you are coughing up blood. °· You have severe back or abdominal pain, feel nauseous, or vomit. °· You develop severe weakness, fainting, or chills. °· You have a fever. °THIS IS AN EMERGENCY. Do not wait to see if the pain will go away. Get medical help at once. Call your local emergency services (911 in [place] Do not drive yourself to the hospital. °MAKE SURE YOU:  °· Understand these instructions. °· Will watch your condition. °· Will get help right away if you are not doing well or get worse. °Document Released: 12/20/2004 Document Revised: 06/04/2011 Document Reviewed: 10/16/2007 °ExitCare® Patient Information ©2014 [employer],   LLC. Possible Esophageal Spasm Esophageal spasm is an uncoordinated contraction of the muscles of the esophagus (the tube which carries food from your mouth to your stomach). Normally, the muscles of the esophagus alternate between contraction and relaxation starting from the top of the esophagus and working down to the  bottom. This moves the food from the mouth to the stomach. In esophageal spasm, all the muscles contract at once. This causes pain and fails to move the food along. As a result, you may have trouble swallowing.  Women are more likely than men to have esophageal spasm. The cause of the spasms is not known. Sometimes eating hot or cold foods triggers the condition and this may be due to an overly sensitive esophagus. This is not an infectious disease and cannot be passed to others. SYMPTOMS  Symptoms of esophageal spasm may include: chest pain, burning or pain with swallowing, and difficulty swallowing.  DIAGNOSIS  Esophageal spasm can be diagnosed by a test called manometry (pressure studies of the esophagus). In this test, a special tube is inserted down the esophagus. The tube measures the muscle activity of the esophagus. Abnormal contractions mixed with normal movement helps confirm the diagnosis.  A person with a hypersensitive esophagus may be diagnosed by inflating a long balloon in the person's esophagus. If this causes the same symptoms, preventive methods may work. PREVENTION  Avoid hot or cold foods if that seems to be a trigger. PROGNOSIS  This condition does not go away, nor is treatment entirely satisfactory. Patients need to be careful of what they eat. They need to continue on medication if a useful one is found. Fortunately, the condition does not get progressively worse as time passes. Esophageal spasm does not usually lead to more serious problems but sometimes the pain can be disabling. If a person becomes afraid to eat they may become malnourished and lose weight.  TREATMENT   A procedure in which instruments of increasing size are inserted through the esophagus to enlarge (dilate) it are used.  Medications that decrease acid-production of the stomach may be used such as proton-pump inhibitors or H2-blockers.  Medications of several types can be used to relax the muscles of the  esophagus.  An individual with a hypersensitive esophagus sometimes improves with low doses of medications normally used for depression.  No treatment for esophageal spasm is effective for everyone. Often several approaches will be tried before one works. In many cases, the symptoms will improve, but will not go away completely.  For severe cases, relief is obtained two-thirds of the time by cutting the muscles along the entire length of the esophagus. This is a major surgical procedure.  Your symptoms are usually the best guide to how well the treatment for esophageal spasm works. SIDE EFFECTS OF TREATMENTS  Nitrates can cause headaches and low blood pressure.  Calcium channel blockers can cause:  Feeling sick to your stomach (nausea).  Constipation and other side effects.  Antidepressants can cause side effects that depend on the medication used. HOME CARE INSTRUCTIONS   Let your caregiver know if problems are getting worse, or if you get food stuck in your esophagus for longer than 1 hour or as directed and are unable to swallow liquid.  Take medications as directed and with permission of your caregiver. Ask about what to do if a medication seems to get stuck in your esophagus. Only take over-the-counter or prescription medicines for pain, discomfort, or fever as directed by your caregiver.  Soft  and liquid foods pass more easily than solid pieces. SEEK IMMEDIATE MEDICAL CARE IF:   You develop severe chest pain, especially if the pain is crushing or pressure-like and spreads to the arms, back, neck, or jaw, or if you have sweating, nausea, or shortness of breath. THIS COULD BE AN EMERGENCY. Do not wait to see if the pain will go away. Get medical help at once. Call 911 or 0 (operator). DO NOT drive yourself to the hospital.  Your chest pain gets worse and does not go away with rest.  You have an attack of chest pain lasting longer than usual despite rest and treatment with the  medications your physician has prescribed.  You wake from sleep with chest pain or shortness of breath.  You feel dizzy or faint.  You have chest pain, not typical of your usual pain, caused by your esophagus for which you originally saw your caregiver. MAKE SURE YOU:   Understand these instructions.  Will watch your condition.  Will get help right away if you are not doing well or get worse. Document Released: 06/02/2002 Document Revised: 06/04/2011 Document Reviewed: 12/16/2007 [facility] Patient Information 2014 [place], [place].  Possible Esophageal Stricture The esophagus is the long, narrow tube which carries food and liquid from the mouth to the stomach. Sometimes a part of the esophagus becomes narrow and makes it difficult, painful, or even impossible to swallow. This is called an esophageal stricture.  CAUSES  Common causes of blockage or strictures of the esophagus are:  Exposure of the lower esophagus to the acid from the stomach may cause narrowing.  Hiatal hernia in which a small part of the stomach bulges up through the diaphragm can cause a narrowing in the bottom of the esophagus.  Scleroderma is a tissue disorder that affects the esophagus and makes swallowing difficult.  Achalasia is an absence of nerves in the lower esophagus and to the esophageal sphincter. This absence of nerves may be congenital (present since birth). This can cause irregular spasms which do not allow food and fluid through.  Strictures may develop from swallowing materials which damage the esophagus. Examples are acids or alkalis such as lye.  Schatzki's Ring is a narrow ring of non-cancerous tissue which narrows the lower esophagus. The cause of this is unknown.  Growths can block the esophagus. SYMPTOMS  Some of the problems are difficulty swallowing or pain with swallowing. DIAGNOSIS  Your caregiver often suspects this problem by taking a medical history. They will also do a physical  exam. They may then take X-rays and/or perform an endoscopy. Endoscopy is an exam in which a tube like a small flexible telescope is used to look at your esophagus.  TREATMENT  One form of treatment is to dilate the narrow area. This means to stretch it.  When this is not successful, chest surgery may be required. This is a much more extensive form of treatment with a longer recovery time. Both of the above treatments make the passage of food and water into the stomach easier. They also make it easier for stomach contents to bubble back into the esophagus. Special medications may be used following the procedure to help prevent further narrowing. Medications may be used to lower the amount of acid in the stomach juice.  SEEK IMMEDIATE MEDICAL CARE IF:   Your swallowing is becoming more painful, difficult, or you are unable to swallow.  You vomit up blood.  You develop black tarry stools.  You develop chills.  You have a fever.  You develop chest or abdominal pain.  You develop shortness of breath, feel lightheaded, or faint. Follow up with medical care as your caregiver suggests. Document Released: 11/20/2005 Document Revised: 06/04/2011 Document Reviewed: 12/27/2005 [facility] Patient Information 2014 [place].   Possible Esophagitis Esophagitis is inflammation of the esophagus. It can involve swelling, soreness, and pain in the esophagus. This condition can make it difficult and painful to swallow. CAUSES  Most causes of esophagitis are not serious. Many different factors can cause esophagitis, including:  Gastroesophageal reflux disease (GERD). This is when acid from your stomach flows up into the esophagus.  Recurrent vomiting.  An allergic-type reaction.  Certain medicines, especially those that come in large pills.  Ingestion of harmful chemicals, such as household cleaning products.  Heavy alcohol use.  An infection of the esophagus.  Radiation treatment for  cancer.  Certain diseases such as sarcoidosis, Crohn's disease, and scleroderma. These diseases may cause recurrent esophagitis. SYMPTOMS   Trouble swallowing.  Painful swallowing.  Chest pain.  Difficulty breathing.  Nausea.  Vomiting.  Abdominal pain. DIAGNOSIS  Your caregiver will take your history and do a physical exam. Depending upon what your caregiver finds, certain tests may also be done, including:  Barium X-ray. You will drink a solution that coats the esophagus, and X-rays will be taken.  Endoscopy. A lighted tube is put down the esophagus so your caregiver can examine the area.  Allergy tests. These can sometimes be arranged through follow-up visits. TREATMENT  Treatment will depend on the cause of your esophagitis. In some cases, steroids or other medicines may be given to help relieve your symptoms or to treat the underlying cause of your condition. Medicines that may be recommended include:  Viscous lidocaine, to soothe the esophagus.  Antacids.  Acid reducers.  Proton pump inhibitors.  Antiviral medicines for certain viral infections of the esophagus.  Antifungal medicines for certain fungal infections of the esophagus.  Antibiotic medicines, depending on the cause of the esophagitis. HOME CARE INSTRUCTIONS   Avoid foods and drinks that seem to make your symptoms worse.  Eat small, frequent meals instead of large meals.  Avoid eating for the 3 hours prior to your bedtime.  If you have trouble taking pills, use a pill splitter to decrease the size and likelihood of the pill getting stuck or injuring the esophagus on the way down. Drinking water after taking a pill also helps.  Stop smoking if you smoke.  Maintain a healthy weight.  Wear loose-fitting clothing. Do not wear anything tight around your waist that causes pressure on your stomach.  Raise the head of your bed 6 to 8 inches with wood blocks to help you sleep. Extra pillows will not  help.  Only take over-the-counter or prescription medicines as directed by your caregiver. SEEK IMMEDIATE MEDICAL CARE IF:  You have severe chest pain that radiates into your arm, neck, or jaw.  You feel sweaty, dizzy, or lightheaded.  You have shortness of breath.  You vomit blood.  You have difficulty or pain with swallowing.  You have bloody or black, tarry stools.  You have a fever.  You have a burning sensation in the chest more than 3 times a week for more than 2 weeks.  You cannot swallow, drink, or eat.  You drool because you cannot swallow your saliva. MAKE SURE YOU:  Understand these instructions.  Will watch your condition.  Will get help right away if you are not  doing well or get worse. Document Released: 04/19/2004 Document Revised: 06/04/2011 Document Reviewed: 11/10/2010 [facility] Patient Information 2014 [place], [place].

## 2013-06-01 NOTE — Telephone Encounter (Signed)
Sent to Raynelle FanningJulie and Ginger since [employer] correspondence sent to wrong [place].

## 2013-06-02 NOTE — Telephone Encounter (Signed)
Darl PikesSusan, please reschedule pt appt.

## 2013-06-08 ENCOUNTER — Encounter (INDEPENDENT_AMBULATORY_CARE_PROVIDER_SITE_OTHER): Payer: Self-pay

## 2013-06-08 ENCOUNTER — Encounter: Payer: Self-pay | Admitting: Gastroenterology

## 2013-06-08 ENCOUNTER — Ambulatory Visit (INDEPENDENT_AMBULATORY_CARE_PROVIDER_SITE_OTHER): Payer: Medicare PPO | Admitting: Gastroenterology

## 2013-06-08 VITALS — BP 133/71 | HR 70 | Temp 98.2°F | Ht 67.0 in | Wt 195.4 lb

## 2013-06-08 DIAGNOSIS — R1310 Dysphagia, unspecified: Secondary | ICD-10-CM

## 2013-06-08 DIAGNOSIS — K219 Gastro-esophageal reflux disease without esophagitis: Secondary | ICD-10-CM

## 2013-06-08 NOTE — Assessment & Plan Note (Signed)
Patient reports chronic GERD with recent acute onset odynophagia, epigastric discomfort (previously reported when he made the appointment and in the emergency department although he denies now). He states he has been symptom-free since he was seen in emergency department. At this time is unclear what medications he is taking, we have called the pharmacy. We know he was given Prilosec emergency department recently. Discussed that patient may have had pill induced esophagitis, ulcerative esophagitis, partial food impaction. Epigastric pain and possible NSAID use, cannot rule out peptic ulcer disease. I offered him an upper endoscopy today. He wants to pursue barium pill esophagram/upper GI series initially. Once the medication list is come from the pharmacy and we will update medication list and make further recommendations.

## 2013-06-08 NOTE — Telephone Encounter (Signed)
Pt was seen today by LSL. 

## 2013-06-08 NOTE — Patient Instructions (Signed)
1. Please have your xray done. We will call you with results. 2. If you have any further problems with painful swallowing, call us.

## 2013-06-08 NOTE — Progress Notes (Signed)
Medications reviewed and updated by CMA.

## 2013-06-08 NOTE — Progress Notes (Signed)
Primary Care Physician: Isabella Stalling, MD  Primary Gastroenterologist:  Roetta Sessions, MD   Chief Complaint  Patient presents with  . Follow-up    HPI: Glenn Brooks is a 80 y.o. male here for further evaluation of painful swallowing. Please note patient is somewhat of a poor historian. Came by the office last week complaining of abdominal pain, pain when he swallowed. He reported that his pain started after beginning an antibiotic for tooth. He no showed for an appointment back in February. Has not been seen since 2012. We advised that he go to the emergency department if he cannot wait until his office visit.   In the ER he is felt to have GI reason for his pain. Cardiac  Etiology was felt to be less likely. He was given Prilosec 20 mg daily. He states he been problems for a few weeks. Chronically he has had issues with heartburn and indigestion. Been on pills for stomach for along time, but is not sure what the name is. Prior to the ER evaluation he felt like food was getting hung up. +odynophagia. No weight loss. No problems getting pills down. BM regular. No melena, brbpr. No weight loss. No prior EGD.   Current Outpatient Prescriptions  Medication Sig Dispense Refill  . aspirin EC 81 MG tablet Take 81 mg by mouth daily.      . celecoxib (CELEBREX) 200 MG capsule Take 200 mg by mouth daily.       . cloNIDine (CATAPRES - DOSED IN MG/24 HR) 0.2 mg/24hr patch Place 1 patch (0.2 mg total) onto the skin once a week.  4 patch  12  . diltiazem (CARTIA XT) 180 MG 24 hr capsule Take 180 mg by mouth daily.      Marland Kitchen LORazepam (ATIVAN) 1 MG tablet Take 1 mg by mouth at bedtime.      . metoprolol succinate (TOPROL-XL) 25 MG 24 hr tablet Take 3 tablets (75 mg total) by mouth daily. Take with or immediately following a meal.  90 tablet  5  . naproxen (NAPROSYN) 500 MG tablet Take 500 mg by mouth 2 (two) times daily with a meal.      . nitroGLYCERIN (NITROSTAT) 0.4 MG SL tablet Place 1  tablet (0.4 mg total) under the tongue every 5 (five) minutes as needed.  25 tablet  6  . omeprazole (PRILOSEC) 20 MG capsule Take 20 mg by mouth daily.      . pregabalin (LYRICA) 75 MG capsule Take 75 mg by mouth 2 (two) times daily.      . simvastatin (ZOCOR) 80 MG tablet Take 1 tablet (80 mg total) by mouth daily.  30 tablet  6  . tadalafil (CIALIS) 5 MG tablet Take 5 mg by mouth daily as needed for erectile dysfunction.       No current facility-administered medications for this visit.    Allergies as of 06/08/2013  . (No Known Allergies)   Past Medical History  Diagnosis Date  . ASCVD (arteriosclerotic cardiovascular disease) 1993    Status post MI and percutaneous intervention  . Arteriosclerotic cardiovascular disease (ASCVD) 1993    Acute MI->PCI in 1993; asymptomatic PVCs  . Degenerative joint disease     shoulder  . ED (erectile dysfunction)     Secondary to medication  . Hyperlipidemia   . Hyperglycemia   . Hypertension   . Testosterone deficiency     Erectile dysfunction  . Obesity   . Colonic polyp  excised via colonoscope   Past Surgical History  Procedure Laterality Date  . Knee arthroscopy      Arthroscopic surgery x 2  . Cataract extraction, bilateral    . Transurethral resection of bladder    . Shoulder surgery    . Colonoscopy w/ polypectomy  04/12/2010    ZOX:[id number]:Normal rectum/Pancolonic diverticula/Polyp at hepatic flexure and cecum, resected with snare technique as above   No family history on file. History   Social History  . Marital Status: Married    Spouse Name: N/A    Number of Children: 2  . Years of Education: N/A   Occupational History  . Rogelia BogaButcher     Retired   Social History Main Topics  . Smoking status: Never Smoker   . Smokeless tobacco: Never Used  . Alcohol Use: No     Comment: Remote excessive use  . Drug Use: No  . Sexual Activity: None   Other Topics Concern  . None   Social History Narrative  . None     ROS:  General: Negative for anorexia, weight loss, fever, chills, fatigue, weakness. ENT: Negative for hoarseness, difficulty swallowing , nasal congestion. CV: Negative for chest pain, angina, palpitations, dyspnea on exertion, peripheral edema.  Respiratory: Negative for dyspnea at rest, dyspnea on exertion, cough, sputum, wheezing.  GI: See history of present illness. GU:  Negative for dysuria, hematuria, urinary incontinence, urinary frequency, nocturnal urination.  Endo: Negative for unusual weight change.    Physical Examination:   BP 133/71  Pulse 70  Temp(Src) 98.2 F (36.8 C) (Oral)  Ht 5\' 7"  (1.702 m)  Wt 195 lb 6.4 oz (88.633 kg)  BMI 30.60 kg/m2  General: Well-nourished, well-developed in no acute distress.  Eyes: No icterus. Mouth: Oropharyngeal mucosa moist and pink , no lesions erythema or exudate. Lungs: Clear to auscultation bilaterally.  Heart: Regular rate and rhythm, no murmurs rubs or gallops.  Abdomen: Bowel sounds are normal, nontender, nondistended, no hepatosplenomegaly or masses, no abdominal bruits or hernia , no rebound or guarding.   Extremities: No lower extremity edema. No clubbing or deformities. Neuro: Alert and oriented x 4   Skin: Warm and dry, no jaundice.   Psych: Alert and cooperative, normal mood and affect.  Labs:  Lab Results  Component Value Date   WBC 3.6* 05/29/2013   HGB 14.0 05/29/2013   HCT 41.0 05/29/2013   MCV 97.6 05/29/2013   PLT 173 05/29/2013   Lab Results  Component Value Date   CREATININE 1.08 05/29/2013   BUN 11 05/29/2013   NA 140 05/29/2013   K 4.3 05/29/2013   CL 104 05/29/2013   CO2 25 05/29/2013   Lab Results  Component Value Date   ALT 20 05/29/2013   AST 29 05/29/2013   ALKPHOS 66 05/29/2013   BILITOT 0.2* 05/29/2013   Lab Results  Component Value Date   TROPONINI <0.30 05/29/2013    Imaging Studies: Dg Chest 2 View  05/29/2013   CLINICAL DATA:  Chest pain  EXAM: CHEST  2 VIEW  COMPARISON:  01/18/2012  FINDINGS: Heart  and mediastinal contours are within normal limits. No focal opacities or effusions. No acute bony abnormality. Degenerative changes in the thoracic spine.  IMPRESSION: No active cardiopulmonary disease.   Electronically Signed   By: Charlett NoseKevin  Dover M.D.   On: 05/29/2013 18:48

## 2013-06-09 NOTE — Progress Notes (Signed)
cc'd to pcp 

## 2013-06-11 ENCOUNTER — Ambulatory Visit (HOSPITAL_COMMUNITY)
Admission: RE | Admit: 2013-06-11 | Discharge: 2013-06-11 | Disposition: A | Discharge status: Home / Self Care | Payer: Medicare HMO | Source: Ambulatory Visit | Attending: Gastroenterology | Admitting: Gastroenterology

## 2013-06-11 DIAGNOSIS — R1310 Dysphagia, unspecified: Secondary | ICD-10-CM

## 2013-06-11 DIAGNOSIS — K219 Gastro-esophageal reflux disease without esophagitis: Secondary | ICD-10-CM

## 2013-06-11 DIAGNOSIS — K449 Diaphragmatic hernia without obstruction or gangrene: Secondary | ICD-10-CM | POA: Insufficient documentation

## 2013-06-16 ENCOUNTER — Encounter: Payer: Self-pay | Admitting: *Deleted

## 2013-06-16 ENCOUNTER — Ambulatory Visit: Payer: Medicare HMO | Admitting: Cardiovascular Disease

## 2013-08-10 ENCOUNTER — Telehealth: Payer: Self-pay | Admitting: Cardiovascular Disease

## 2013-08-10 NOTE — Telephone Encounter (Signed)
Prior authorization needed. Please see paper in refill bin / tgs

## 2013-08-11 ENCOUNTER — Telehealth: Payer: Self-pay | Admitting: Cardiovascular Disease

## 2013-08-11 NOTE — Telephone Encounter (Signed)
Called pharmacy and refilled prescription.

## 2013-08-11 NOTE — Telephone Encounter (Signed)
Prior authorization needed please see paper in refill bin / tgs

## 2013-08-21 ENCOUNTER — Encounter: Payer: Self-pay | Admitting: *Deleted

## 2013-11-02 ENCOUNTER — Other Ambulatory Visit (HOSPITAL_COMMUNITY): Payer: Self-pay | Admitting: Family Medicine

## 2013-11-02 ENCOUNTER — Ambulatory Visit (HOSPITAL_COMMUNITY)
Admission: RE | Admit: 2013-11-02 | Discharge: 2013-11-02 | Disposition: A | Discharge status: Home / Self Care | Payer: Medicare HMO | Source: Ambulatory Visit | Attending: Family Medicine | Admitting: Family Medicine

## 2013-11-02 DIAGNOSIS — M1120 Other chondrocalcinosis, unspecified site: Secondary | ICD-10-CM | POA: Insufficient documentation

## 2013-11-02 DIAGNOSIS — M19041 Primary osteoarthritis, right hand: Secondary | ICD-10-CM

## 2013-11-02 DIAGNOSIS — M1710 Unilateral primary osteoarthritis, unspecified knee: Secondary | ICD-10-CM | POA: Diagnosis not present

## 2013-11-02 DIAGNOSIS — IMO0002 Reserved for concepts with insufficient information to code with codable children: Secondary | ICD-10-CM | POA: Diagnosis not present

## 2013-11-02 DIAGNOSIS — M170 Bilateral primary osteoarthritis of knee: Secondary | ICD-10-CM

## 2013-11-02 DIAGNOSIS — M19049 Primary osteoarthritis, unspecified hand: Secondary | ICD-10-CM | POA: Diagnosis not present

## 2013-11-16 ENCOUNTER — Other Ambulatory Visit: Payer: Self-pay | Admitting: Cardiology

## 2013-11-17 ENCOUNTER — Other Ambulatory Visit: Payer: Self-pay | Admitting: *Deleted

## 2013-12-14 ENCOUNTER — Telehealth: Payer: Self-pay | Admitting: Cardiovascular Disease

## 2013-12-14 MED ORDER — SIMVASTATIN 80 MG PO TABS: 80.0000 mg | ORAL_TABLET | Freq: Every day | ORAL | Status: AC

## 2013-12-14 NOTE — Telephone Encounter (Signed)
Sent half month supply to pharmacy pt needs appt for further refills

## 2013-12-14 NOTE — Telephone Encounter (Signed)
Received fax refill request  Rx # [id number] Medication:  Simvastatin 80 mg tablets Qty 15  Sig:  Take one tablet by mouth every day  Physician:  Purvis Sheffield  Patient is requesting authorization to dispense a 90 day supply

## 2013-12-16 ENCOUNTER — Encounter: Payer: Self-pay | Admitting: *Deleted

## 2013-12-24 ENCOUNTER — Ambulatory Visit: Payer: Medicare HMO | Admitting: Orthopedic Surgery

## 2013-12-30 ENCOUNTER — Telehealth: Payer: Self-pay | Admitting: Cardiovascular Disease

## 2013-12-30 NOTE — Telephone Encounter (Signed)
Received fax refill request  Rx # [id number] Medication:  Simvastatin 80 mg tablets Qty 30 Sig:  Take one tablet by mouth every day Physician:  Purvis SheffieldKoneswaran

## 2013-12-30 NOTE — Telephone Encounter (Signed)
Refilled on 12/14/13

## 2014-01-07 ENCOUNTER — Encounter: Payer: Self-pay | Admitting: Orthopedic Surgery

## 2014-01-07 ENCOUNTER — Ambulatory Visit (INDEPENDENT_AMBULATORY_CARE_PROVIDER_SITE_OTHER): Payer: Medicare HMO | Admitting: Orthopedic Surgery

## 2014-01-07 VITALS — BP 137/81 | Ht 67.0 in | Wt 204.2 lb

## 2014-01-07 DIAGNOSIS — M170 Bilateral primary osteoarthritis of knee: Secondary | ICD-10-CM

## 2014-01-07 MED ORDER — TRAMADOL-ACETAMINOPHEN 37.5-325 MG PO TABS: 1.0000 | ORAL_TABLET | ORAL | Status: DC | PRN

## 2014-01-07 NOTE — Progress Notes (Signed)
Subjective:   Chief Complaint  Patient presents with  . Knee Pain    [facility]     Glenn Brooks is a 80 y.o. male who presents with knee pain involving both knees. Onset was Greater than 10 years ago. Inciting event: none known. Current symptoms include: crepitus sensation, giving out, locking, pain located Right and left knee diffuse, popping sensation, stiffness and swelling. Pain is aggravated by any weight bearing, going up and down stairs, kneeling, rising after sitting, squatting, standing and walking. Patient has had prior knee problems. Evaluation to date: plain films: Significant and severe osteoarthritis both knees and arthroscopy  arthroscopy was done of the right knee x2, he's also had multiple injections in both knees and says they no longer work. The following portions of the patient's history were reviewed and updated as appropriate: allergies, current medications, past family history, past medical history, past social history, past surgical history and problem list.   Review of Systems A comprehensive review of systems was negative except for: Respiratory: positive for dyspnea on exertion Musculoskeletal: positive for arthralgias, back pain, myalgias and stiff joints   Objective:    BP 137/81  Ht 5\' 7"  (1.702 m)  Wt 204 lb 3.2 oz (92.625 kg)  BMI 31.97 kg/m2  Overall he has no gross abnormalities  He is oriented x3 his mood is flat his affect is flat  He ambulates with a slight limp and slow gait with short stride lengths consistent with osteoarthritis  His upper extremities show normal alignment range of motion stability strength skin pulse and no lymphadenopathy  The lower extremities exhibit decreased range of motion in both knees with crepitance tenderness along both joint lines although the knee is remain stable. Strength is normal skin is normal small joint effusions are noted. Skin is intact bilaterally. Has good distal pulses  normal sensation no pathologic reflexes overall his balance is normal  He has had to x-rays my interpretation of those x-rays is that he has bilateral grade 4 osteoarthritis severe  The patient is not interested in surgical intervention. I recommended that he have bilateral total knees.  He says he needs something else for pain despite the nabumetone 750 twice a day  I recommend that he takes Ultracet for pain. He did not want surgery we gave him prescriptions to get to economy hinged knee braces  Followup as needed

## 2014-01-11 ENCOUNTER — Telehealth: Payer: Self-pay | Admitting: *Deleted

## 2014-01-11 NOTE — Telephone Encounter (Signed)
Spoke with patient and advised that Glenn Brooks, Dr. Mort Brooks's nurse, has spoken with brace representative, and ordered the braces. She will notify him as soon as they come in and set up a time for him to come in and be fitted.

## 2014-01-12 ENCOUNTER — Telehealth: Payer: Self-pay | Admitting: Cardiovascular Disease

## 2014-01-12 MED ORDER — DILTIAZEM HCL ER COATED BEADS 180 MG PO CP24: 180.0000 mg | ORAL_CAPSULE | Freq: Every day | ORAL | Status: AC

## 2014-01-12 NOTE — Telephone Encounter (Signed)
Received fax refill request  Rx # [id number] Medication:  Cartia (Diltiazem) 180 mg XT caps Qty 30 Sig:  Take one capsule by mouth every day Physician:  Purvis SheffieldKoneswaran

## 2014-02-26 ENCOUNTER — Telehealth: Payer: Self-pay | Admitting: *Deleted

## 2014-02-26 NOTE — Telephone Encounter (Signed)
Pt already given courtesy refill in Oct, has not made apt since 03/18/2013    Will ask front staff to schedule apt

## 2014-02-26 NOTE — Telephone Encounter (Signed)
cartia 180 mgxt #15

## 2014-03-25 ENCOUNTER — Other Ambulatory Visit: Payer: Self-pay

## 2014-03-25 MED ORDER — METOPROLOL SUCCINATE ER 25 MG PO TB24: 75.0000 mg | ORAL_TABLET | Freq: Every day | ORAL | Status: DC

## 2014-04-02 ENCOUNTER — Encounter: Payer: Self-pay | Admitting: *Deleted

## 2014-04-02 ENCOUNTER — Ambulatory Visit: Payer: Medicare HMO | Admitting: Cardiovascular Disease

## 2014-07-19 ENCOUNTER — Other Ambulatory Visit: Payer: Self-pay | Admitting: Orthopedic Surgery

## 2014-08-19 ENCOUNTER — Other Ambulatory Visit: Payer: Self-pay | Admitting: Cardiovascular Disease

## 2014-08-24 ENCOUNTER — Telehealth: Payer: Self-pay | Admitting: Cardiovascular Disease

## 2014-08-24 NOTE — Telephone Encounter (Signed)
Prior Authorization needed / please see refill bin / tg

## 2015-05-06 ENCOUNTER — Telehealth: Payer: Self-pay | Admitting: Orthopaedic Surgery

## 2015-05-09 NOTE — Telephone Encounter (Signed)
Rx done. 

## 2015-05-17 ENCOUNTER — Telehealth: Payer: Self-pay | Admitting: *Deleted

## 2015-05-17 MED ORDER — ALLOPURINOL 300 MG PO TABS: 300.0000 mg | ORAL_TABLET | Freq: Every day | ORAL | Status: DC

## 2015-05-17 NOTE — Addendum Note (Signed)
Addended by: Earnstine Regal on: 05/17/2015 01:56 PM   Modules accepted: Orders

## 2015-05-17 NOTE — Telephone Encounter (Signed)
Patient aware prescription was sent to the pharmacy.  °

## 2015-05-17 NOTE — Telephone Encounter (Signed)
rx done

## 2015-05-17 NOTE — Telephone Encounter (Signed)
REFILL REQUEST FOR ALLOPURINOL  ONE TABLET BY MOUTH DAILY  #30  [facility] [place]

## 2015-11-16 ENCOUNTER — Telehealth: Payer: Self-pay | Admitting: Orthopaedic Surgery

## 2015-12-30 ENCOUNTER — Emergency Department (HOSPITAL_COMMUNITY)
Admission: EM | Admit: 2015-12-30 | Discharge: 2015-12-30 | Discharge status: Against Medical Advice | Payer: Medicare HMO | Attending: Emergency Medicine | Admitting: Emergency Medicine

## 2015-12-30 ENCOUNTER — Encounter (HOSPITAL_COMMUNITY): Payer: Self-pay

## 2015-12-30 DIAGNOSIS — M6281 Muscle weakness (generalized): Secondary | ICD-10-CM | POA: Insufficient documentation

## 2015-12-30 DIAGNOSIS — Z7982 Long term (current) use of aspirin: Secondary | ICD-10-CM | POA: Insufficient documentation

## 2015-12-30 DIAGNOSIS — R1030 Lower abdominal pain, unspecified: Secondary | ICD-10-CM | POA: Insufficient documentation

## 2015-12-30 DIAGNOSIS — R350 Frequency of micturition: Secondary | ICD-10-CM | POA: Diagnosis not present

## 2015-12-30 DIAGNOSIS — I2510 Atherosclerotic heart disease of native coronary artery without angina pectoris: Secondary | ICD-10-CM | POA: Insufficient documentation

## 2015-12-30 DIAGNOSIS — R29898 Other symptoms and signs involving the musculoskeletal system: Secondary | ICD-10-CM

## 2015-12-30 DIAGNOSIS — I10 Essential (primary) hypertension: Secondary | ICD-10-CM | POA: Diagnosis not present

## 2015-12-30 DIAGNOSIS — Z79899 Other long term (current) drug therapy: Secondary | ICD-10-CM | POA: Diagnosis not present

## 2015-12-30 NOTE — ED Provider Notes (Addendum)
[facility]-EMERGENCY DEPT Provider Note   CSN: [id number] Arrival date & time: 12/30/15  4098  By signing my name below, I, Majel Homer, attest that this documentation has been prepared under the direction and in the presence of Raeford Razor, MD . Electronically Signed: Majel Homer, Scribe. 12/30/2015. 10:13 AM.  History   Chief Complaint Chief Complaint  Patient presents with  . Urinary Incontinence  . Extremity Weakness   The history is provided by the patient. No language interpreter was used.   HPI Comments: Glenn Brooks is a 82 y.o. male with PMHx of HTN, HLD and ASCVD, who presents to the Emergency Department complaining of gradually worsening, urinary frequency and incontinence that began ~3 days ago. Pt reports associated decrease in urine output, constipation, mild suprapubic abdominal pain and lower back pain. He states he feels as if he urinates "every 5 minutes." He also notes he fell ~4 times last week and is now experiencing weakness in his bilateral legs. He denies dysuria, nausea, vomiting and PSHx to his back.   Past Medical History:  Diagnosis Date  . Arteriosclerotic cardiovascular disease (ASCVD) 1993   Acute MI->PCI in 1993; asymptomatic PVCs  . ASCVD (arteriosclerotic cardiovascular disease) 1993   Status post MI and percutaneous intervention  . Colonic polyp    excised via colonoscope  . Degenerative joint disease    shoulder  . ED (erectile dysfunction)    Secondary to medication  . Hyperglycemia   . Hyperlipidemia   . Hypertension   . Obesity   . Testosterone deficiency    Erectile dysfunction    Patient Active Problem List   Diagnosis Date Noted  . Primary osteoarthritis of both knees 01/07/2014  . Odynophagia 06/08/2013  . GERD (gastroesophageal reflux disease) 06/08/2013  . HYPERLIPIDEMIA 02/10/2010  . HYPERTENSION 03/24/2009  . ATHEROSCLEROTIC CARDIOVASCULAR DISEASE 03/24/2009  . DEGENERATIVE JOINT DISEASE 03/24/2009  . HYPERGLYCEMIA 03/24/2009     Past Surgical History:  Procedure Laterality Date  . CATARACT EXTRACTION, BILATERAL    . COLONOSCOPY W/ POLYPECTOMY  04/12/2010   JXB:JYNWGN rectum/Pancolonic diverticula/Polyp at hepatic flexure and cecum, resected with snare technique as above  . KNEE ARTHROSCOPY     Arthroscopic surgery x 2  . SHOULDER SURGERY    . TRANSURETHRAL RESECTION OF BLADDER      Home Medications    Prior to Admission medications   Medication Sig Start Date End Date Taking? Authorizing Provider  allopurinol (ZYLOPRIM) 300 MG tablet TAKE ONE TABLET BY MOUTH EVERY DAY 11/17/15   Darreld Mclean, MD  aspirin EC 81 MG tablet Take 81 mg by mouth daily.    Historical Provider, MD  celecoxib (CELEBREX) 200 MG capsule Take 200 mg by mouth daily.     Historical Provider, MD  cloNIDine (CATAPRES - DOSED IN MG/24 HR) 0.2 mg/24hr patch PLACE 1 PATCH ONTO THE SKIN ONCE A WEEK 11/17/13   Laqueta Linden, MD  diltiazem (CARTIA XT) 180 MG 24 hr capsule Take 1 capsule (180 mg total) by mouth daily. 01/12/14   Laqueta Linden, MD  LORazepam (ATIVAN) 1 MG tablet Take 1 mg by mouth at bedtime.    Historical Provider, MD  metoprolol succinate (TOPROL-XL) 25 MG 24 hr tablet TAKE 3 TABLETS BY MOUTH DAILY TAKE WITH OR IMMEDIATELY FOLLOWING A MEAL 08/19/14   Laqueta Linden, MD  nabumetone (RELAFEN) 750 MG tablet Take 750 mg by mouth daily.    Historical Provider, MD  naproxen (NAPROSYN) 500 MG tablet Take 500 mg by  mouth 2 (two) times daily with a meal.    Historical Provider, MD  nitroGLYCERIN (NITROSTAT) 0.4 MG SL tablet Place 1 tablet (0.4 mg total) under the tongue every 5 (five) minutes as needed. 07/30/12   Kathlen Brunswickobert M Rothbart, MD  omeprazole (PRILOSEC) 20 MG capsule Take 20 mg by mouth daily.    Historical Provider, MD  pregabalin (LYRICA) 75 MG capsule Take 75 mg by mouth 2 (two) times daily.    Historical Provider, MD  QUEtiapine (SEROQUEL) 200 MG tablet Take 200 mg by mouth at bedtime.    Historical Provider, MD    simvastatin (ZOCOR) 80 MG tablet Take 1 tablet (80 mg total) by mouth daily. 12/14/13   Laqueta LindenSuresh A Koneswaran, MD  tadalafil (CIALIS) 5 MG tablet Take 5 mg by mouth daily as needed for erectile dysfunction.    Historical Provider, MD  traMADol-acetaminophen (ULTRACET) 37.5-325 MG per tablet Take 1 tablet by mouth every 4 (four) hours as needed. 01/07/14   Vickki HearingStanley E Harrison, MD    Family History No family history on file.  Social History Social History  Substance Use Topics  . Smoking status: Never Smoker  . Smokeless tobacco: Never Used  . Alcohol use No     Comment: none in 40 years   Allergies   Review of patient's allergies indicates no known allergies.   Review of Systems Review of Systems  Gastrointestinal: Positive for abdominal pain. Negative for nausea and vomiting.  Genitourinary: Positive for decreased urine volume and frequency. Negative for dysuria.  Neurological: Positive for weakness.   Physical Exam Updated Vital Signs BP 160/78 (BP Location: Right Leg)   Pulse 119   Temp 99.4 F (37.4 C) (Oral)   Resp 15   SpO2 96%   Physical Exam  Constitutional: He is oriented to person, place, and time. He appears well-developed and well-nourished.  HENT:  Head: Normocephalic and atraumatic.  Eyes: EOM are normal.  Neck: Normal range of motion.  Cardiovascular: Normal rate, regular rhythm, normal heart sounds and intact distal pulses.   Pulmonary/Chest: Effort normal and breath sounds normal. No respiratory distress.  Abdominal: Soft. He exhibits no distension. There is tenderness.  Mild suprapubic tenderness.   Musculoskeletal: Normal range of motion.  Neurological: He is alert and oriented to person, place, and time.  Pt can stand unassisted but seems very unsteady.   Skin: Skin is warm and dry.  Psychiatric: He has a normal mood and affect. Judgment normal.  Nursing note and vitals reviewed.  ED Treatments / Results  Labs (all labs ordered are listed, but  only abnormal results are displayed) Labs Reviewed - No data to display  EKG  EKG Interpretation None       Radiology No results found.  Procedures Procedures (including critical care time)  Medications Ordered in ED Medications - No data to display  DIAGNOSTIC STUDIES:  Oxygen Saturation is 96% on RA, normal by my interpretation.    COORDINATION OF CARE:  10:10 AM Discussed treatment plan with pt at bedside and pt agreed to plan.  Initial Impression / Assessment and Plan / ED Course  I have reviewed the triage vital signs and the nursing notes.  Pertinent labs & imaging results that were available during my care of the patient were reviewed by me and considered in my medical decision making (see chart for details).  Clinical Course    10:54 AM Unfortunately Mr Carole BinningMeadows is adamant about leaving. "Everyone is looking at me like I'm crazy." "They  lookin at me like I'm going to steal something." I tried to reassure him that we are only concerned about his well being. I advised that I suspect he has a UTI and tried to convince him to at least stay until this was resulted. He declined. He has medical decision making capability. He left the ED walking on crutches but without assistance from staff. Encouraged him to see his PCP or return to the ED whenever he felt like he might need to.   I personally performed the services described in this documentation, which was scribed in my presence. The recorded information has been reviewed and is accurate.   Final Clinical Impressions(s) / ED Diagnoses   Final diagnoses:  Weakness of lower extremity, unspecified laterality    New Prescriptions New Prescriptions   No medications on file    I personally preformed the services scribed in my presence. The recorded information has been reviewed is accurate. Raeford Razor, MD.  Raeford Razor, MD 01/02/16 0981    Raeford Razor, MD 01/02/16 [phone]

## 2015-12-30 NOTE — ED Triage Notes (Signed)
Pt reports he was walking to see his doctor and his doctor was closed.  He said a stranger picked him up and brought him to the ER.  Pt c/o urinary incontinence x 1 week and weakness in both legs.  Denies any pain.  Pt oriented to place and self but says he got his days mixed up and that's why he missed his doctors appt.

## 2015-12-30 NOTE — ED Notes (Signed)
Pt is very insistent that he wants to leave. MD notified and came to speak with pt. Ok for pt to leave by himself per MD. Pt left ambulatory with crutches.

## 2016-03-27 ENCOUNTER — Encounter (HOSPITAL_COMMUNITY): Payer: Self-pay | Admitting: Emergency Medicine

## 2016-03-27 ENCOUNTER — Emergency Department (HOSPITAL_COMMUNITY)
Admission: EM | Admit: 2016-03-27 | Discharge: 2016-03-27 | Disposition: A | Discharge status: Home / Self Care | Payer: Medicare PPO | Attending: Emergency Medicine | Admitting: Emergency Medicine

## 2016-03-27 DIAGNOSIS — Y939 Activity, unspecified: Secondary | ICD-10-CM | POA: Insufficient documentation

## 2016-03-27 DIAGNOSIS — X58XXXA Exposure to other specified factors, initial encounter: Secondary | ICD-10-CM | POA: Diagnosis not present

## 2016-03-27 DIAGNOSIS — S0502XA Injury of conjunctiva and corneal abrasion without foreign body, left eye, initial encounter: Secondary | ICD-10-CM | POA: Diagnosis not present

## 2016-03-27 DIAGNOSIS — I10 Essential (primary) hypertension: Secondary | ICD-10-CM | POA: Diagnosis not present

## 2016-03-27 DIAGNOSIS — Y929 Unspecified place or not applicable: Secondary | ICD-10-CM | POA: Diagnosis not present

## 2016-03-27 DIAGNOSIS — Y999 Unspecified external cause status: Secondary | ICD-10-CM | POA: Insufficient documentation

## 2016-03-27 DIAGNOSIS — Z7982 Long term (current) use of aspirin: Secondary | ICD-10-CM | POA: Diagnosis not present

## 2016-03-27 DIAGNOSIS — S0592XA Unspecified injury of left eye and orbit, initial encounter: Secondary | ICD-10-CM | POA: Diagnosis present

## 2016-03-27 DIAGNOSIS — Z79899 Other long term (current) drug therapy: Secondary | ICD-10-CM | POA: Insufficient documentation

## 2016-03-27 DIAGNOSIS — I2510 Atherosclerotic heart disease of native coronary artery without angina pectoris: Secondary | ICD-10-CM | POA: Diagnosis not present

## 2016-03-27 DIAGNOSIS — H5789 Other specified disorders of eye and adnexa: Secondary | ICD-10-CM

## 2016-03-27 MED ORDER — KETOROLAC TROMETHAMINE 0.5 % OP SOLN: 1.0000 [drp] | Freq: Four times a day (QID) | OPHTHALMIC | 0 refills | Status: AC

## 2016-03-27 MED ORDER — ERYTHROMYCIN 5 MG/GM OP OINT: TOPICAL_OINTMENT | OPHTHALMIC | 0 refills | Status: DC

## 2016-03-27 MED ORDER — TETRACAINE HCL 0.5 % OP SOLN
2.0000 [drp] | Freq: Once | OPHTHALMIC | Status: AC
  Administered 2016-03-27: 2 [drp] via OPHTHALMIC
  Filled 2016-03-27: qty 4

## 2016-03-27 MED ORDER — FLUORESCEIN SODIUM 0.6 MG OP STRP
1.0000 | ORAL_STRIP | Freq: Once | OPHTHALMIC | Status: AC
  Administered 2016-03-27: 1 via OPHTHALMIC
  Filled 2016-03-27: qty 1

## 2016-03-27 NOTE — ED Provider Notes (Signed)
[facility]-EMERGENCY DEPT Provider Note   CSN: [id number] Arrival date & time: 03/27/16  1145     History   Chief Complaint Chief Complaint  Patient presents with  . Eye Pain    HPI Glenn Brooks is a 83 y.o. male presents to the emergency department with left eye redness that he noticed this morning when he woke up. Patient states that he rolled over in bed and poked himself in the eye with his thumb. Patient states his eye became red and started tearing, also reports light sensitivity. Patient denies pain, blurry vision, floaters, pain with eye movements.  HPI  Past Medical History:  Diagnosis Date  . Arteriosclerotic cardiovascular disease (ASCVD) 1993   Acute MI->PCI in 1993; asymptomatic PVCs  . ASCVD (arteriosclerotic cardiovascular disease) 1993   Status post MI and percutaneous intervention  . Colonic polyp    excised via colonoscope  . Degenerative joint disease    shoulder  . ED (erectile dysfunction)    Secondary to medication  . Hyperglycemia   . Hyperlipidemia   . Hypertension   . Obesity   . Testosterone deficiency    Erectile dysfunction    Patient Active Problem List   Diagnosis Date Noted  . Primary osteoarthritis of both knees 01/07/2014  . Odynophagia 06/08/2013  . GERD (gastroesophageal reflux disease) 06/08/2013  . HYPERLIPIDEMIA 02/10/2010  . HYPERTENSION 03/24/2009  . ATHEROSCLEROTIC CARDIOVASCULAR DISEASE 03/24/2009  . DEGENERATIVE JOINT DISEASE 03/24/2009  . HYPERGLYCEMIA 03/24/2009    Past Surgical History:  Procedure Laterality Date  . CATARACT EXTRACTION, BILATERAL    . COLONOSCOPY W/ POLYPECTOMY  04/12/2010   WUJ:WJXBJY rectum/Pancolonic diverticula/Polyp at hepatic flexure and cecum, resected with snare technique as above  . KNEE ARTHROSCOPY     Arthroscopic surgery x 2  . SHOULDER SURGERY    . TRANSURETHRAL RESECTION OF BLADDER         Home Medications    Prior to Admission medications   Medication Sig Start Date End Date  Taking? Authorizing Provider  allopurinol (ZYLOPRIM) 300 MG tablet TAKE ONE TABLET BY MOUTH EVERY DAY 11/17/15   Darreld Mclean, MD  aspirin EC 81 MG tablet Take 81 mg by mouth daily.    Historical Provider, MD  celecoxib (CELEBREX) 200 MG capsule Take 200 mg by mouth daily.     Historical Provider, MD  cloNIDine (CATAPRES - DOSED IN MG/24 HR) 0.2 mg/24hr patch PLACE 1 PATCH ONTO THE SKIN ONCE A WEEK 11/17/13   Laqueta Linden, MD  diltiazem (CARTIA XT) 180 MG 24 hr capsule Take 1 capsule (180 mg total) by mouth daily. 01/12/14   Laqueta Linden, MD  erythromycin ophthalmic ointment Place a 1/2 inch ribbon of ointment into the lower eyelid. 03/27/16   Liberty Handy, PA-C  ketorolac (ACULAR) 0.5 % ophthalmic solution Place 1 drop into both eyes every 6 (six) hours. 03/27/16   Liberty Handy, PA-C  LORazepam (ATIVAN) 1 MG tablet Take 1 mg by mouth at bedtime.    Historical Provider, MD  metoprolol succinate (TOPROL-XL) 25 MG 24 hr tablet TAKE 3 TABLETS BY MOUTH DAILY TAKE WITH OR IMMEDIATELY FOLLOWING A MEAL 08/19/14   Laqueta Linden, MD  nabumetone (RELAFEN) 750 MG tablet Take 750 mg by mouth daily.    Historical Provider, MD  naproxen (NAPROSYN) 500 MG tablet Take 500 mg by mouth 2 (two) times daily with a meal.    Historical Provider, MD  nitroGLYCERIN (NITROSTAT) 0.4 MG SL tablet Place 1 tablet (  0.4 mg total) under the tongue every 5 (five) minutes as needed. 07/30/12   Kathlen Brunswick, MD  omeprazole (PRILOSEC) 20 MG capsule Take 20 mg by mouth daily.    Historical Provider, MD  pregabalin (LYRICA) 75 MG capsule Take 75 mg by mouth 2 (two) times daily.    Historical Provider, MD  QUEtiapine (SEROQUEL) 200 MG tablet Take 200 mg by mouth at bedtime.    Historical Provider, MD  simvastatin (ZOCOR) 80 MG tablet Take 1 tablet (80 mg total) by mouth daily. 12/14/13   Laqueta Linden, MD  tadalafil (CIALIS) 5 MG tablet Take 5 mg by mouth daily as needed for erectile dysfunction.     Historical Provider, MD  traMADol-acetaminophen (ULTRACET) 37.5-325 MG per tablet Take 1 tablet by mouth every 4 (four) hours as needed. 01/07/14   Vickki Hearing, MD    Family History No family history on file.  Social History Social History  Substance Use Topics  . Smoking status: Never Smoker  . Smokeless tobacco: Never Used  . Alcohol use No     Comment: none in 40 years     Allergies   Patient has no known allergies.   Review of Systems Review of Systems  Constitutional: Negative for chills and fever.  HENT: Negative for congestion and sore throat.   Eyes: Positive for photophobia and redness. Negative for visual disturbance.  Respiratory: Negative for cough and shortness of breath.   Cardiovascular: Negative for chest pain and palpitations.  Gastrointestinal: Negative for abdominal pain, blood in stool, constipation, diarrhea, nausea and vomiting.  Genitourinary: Negative for difficulty urinating, flank pain and hematuria.  Musculoskeletal: Negative for joint swelling and myalgias.  Skin: Negative for rash.  Neurological: Negative for dizziness, syncope, weakness, light-headedness and headaches.  Hematological: Negative.   Psychiatric/Behavioral: Negative.      Physical Exam Updated Vital Signs BP 165/81 (BP Location: Left Arm)   Pulse 87   Temp 98 F (36.7 C) (Oral)   Resp 18   Ht 5\' 7"  (1.702 m)   Wt 81.2 kg   SpO2 99%   BMI 28.04 kg/m   Physical Exam  Constitutional: He is oriented to person, place, and time. Vital signs are normal. He appears well-developed and well-nourished. No distress.  HENT:  Head: Normocephalic and atraumatic.  Nose: Nose normal.  Mouth/Throat: Oropharynx is clear and moist. No oropharyngeal exudate.  Eyes:  PERRL. EOMs normal.  Injected L conjunctiva.  No foreign bodies noted.  Globe is not rigid.  No eyelid injuries. No orbital bone or zygomatic bone tenderness. Cloudy pupils bilaterally.  Neck: Normal range of motion.  Neck supple. No JVD present.  Cardiovascular: Normal rate, regular rhythm, normal heart sounds and intact distal pulses.   No murmur heard. Pulmonary/Chest: Effort normal and breath sounds normal. No respiratory distress. He has no wheezes. He has no rales.  Abdominal: Soft. Bowel sounds are normal. He exhibits no distension. There is no tenderness.  Musculoskeletal: Normal range of motion. He exhibits no deformity.  Lymphadenopathy:    He has no cervical adenopathy.  Neurological: He is alert and oriented to person, place, and time.  Skin: Skin is warm and dry. Capillary refill takes less than 2 seconds. No rash noted.  Psychiatric: He has a normal mood and affect. His behavior is normal.  Nursing note and vitals reviewed.    ED Treatments / Results  Labs (all labs ordered are listed, but only abnormal results are displayed) Labs Reviewed - No  data to display  EKG  EKG Interpretation None       Radiology No results found.  Procedures Procedures (including critical care time)  Medications Ordered in ED Medications  tetracaine (PONTOCAINE) 0.5 % ophthalmic solution 2 drop (2 drops Left Eye Given by Other 03/27/16 1405)  fluorescein ophthalmic strip 1 strip (1 strip Left Eye Given by Other 03/27/16 1406)     Initial Impression / Assessment and Plan / ED Course  I have reviewed the triage vital signs and the nursing notes.  Pertinent labs & imaging results that were available during my care of the patient were reviewed by me and considered in my medical decision making (see chart for details).  Clinical Course    Left conjunctival injection with intact EOMs bilaterally. PERRL bilaterally. I did not notice foreign bodies in left eye. No eyelid trauma. No nystagmus. Fluorescein staining revealed left corneal abrasion. Patient will be discharged with antibiotic ointment and ketorolac eyedrops and close follow-up with PCP and/or ophthalmologist. Patient given resources to focal  pulmonologist. M.D. return instructions given, patient verbalized understanding and is agreeable to discharge plan.  Final Clinical Impressions(s) / ED Diagnoses   Final diagnoses:  Abrasion of left cornea, initial encounter  Eye irritation    New Prescriptions Discharge Medication List as of 03/27/2016  1:59 PM    START taking these medications   Details  erythromycin ophthalmic ointment Place a 1/2 inch ribbon of ointment into the lower eyelid., Print    ketorolac (ACULAR) 0.5 % ophthalmic solution Place 1 drop into both eyes every 6 (six) hours., Starting Tue 03/27/2016, Print         Liberty Handylaudia J Gibbons, PA-C 03/27/16 1447    Donnetta HutchingBrian Cook, MD 03/28/16 [phone]

## 2016-03-27 NOTE — ED Triage Notes (Signed)
Patient states that he stuck his finger in his eye yesterday morning when he was sleeping. Patient now reports redness and pain.

## 2016-03-27 NOTE — Discharge Instructions (Signed)
You have a corneal abrasion on your left eye. Please repeat the attached information on corneal abrasions.  He has been prescribed antibiotic eye ointment and anti-inflammatory and pain eyedrops as well. Please take these as prescribed. It's important that he follow-up with an ophthalmologist within 5-7 days to ensure that her symptoms are improving.  Return to the emergency department if you notice changes in your vision, or if her symptoms worsen.

## 2016-03-28 NOTE — ED Notes (Addendum)
[facility] called and reported frequency was not provide with erythromycin ointment prescription. Consulted Dr. Ranae PalmsYelverton and was informed that this medication should be administered four times daily. Karolee StampsJanelle, at [facility], contacted and informed of frequency for medication. Janelle verified with readback.

## 2016-08-14 ENCOUNTER — Encounter: Payer: Self-pay | Admitting: Cardiovascular Disease

## 2016-08-14 ENCOUNTER — Ambulatory Visit (INDEPENDENT_AMBULATORY_CARE_PROVIDER_SITE_OTHER): Payer: Medicare HMO | Admitting: Cardiovascular Disease

## 2016-08-14 VITALS — BP 165/90 | HR 90 | Ht 67.0 in | Wt 191.0 lb

## 2016-08-14 DIAGNOSIS — Z01818 Encounter for other preprocedural examination: Secondary | ICD-10-CM | POA: Diagnosis not present

## 2016-08-14 DIAGNOSIS — R0609 Other forms of dyspnea: Secondary | ICD-10-CM | POA: Diagnosis not present

## 2016-08-14 DIAGNOSIS — E782 Mixed hyperlipidemia: Secondary | ICD-10-CM | POA: Diagnosis not present

## 2016-08-14 DIAGNOSIS — I25118 Atherosclerotic heart disease of native coronary artery with other forms of angina pectoris: Secondary | ICD-10-CM | POA: Diagnosis not present

## 2016-08-14 DIAGNOSIS — I10 Essential (primary) hypertension: Secondary | ICD-10-CM

## 2016-08-14 NOTE — Progress Notes (Signed)
CARDIOLOGY CONSULT NOTE  Patient ID: Glenn Brooks MRN: [id number] DOB/AGE: [birth year] 81 y.o.  Admit date: (Not on file) Primary Physician: Lucia Gaskins, MD Referring Physician: Cindie Laroche  Reason for Consultation: preop clearance  HPI: Glenn Brooks is a 83 y.o. male who is being seen today for the evaluation of preoperative risk stratification at the request of Lucia Gaskins, MD.   He is an 83 yr old male whom I met once before on 03/18/13. He has a history of coronary artery disease and prior myocardial infarction (PCI in [place] in 1993), CVA, hypertension, hyperlipidemia, and DJD. He is being scheduled to undergo right TKA.  Most recent nuclear stress test showed a prior inferolateral infarction with no evidence of ischemia on 01/31/12.  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  He denies chest pain. He has occasional exertional dyspnea but cannot tell me with what specific activities. Denies palpitations, orthopnea, paroxysmal nocturnal dyspnea, and syncope.  He has occasional chest tightness but thinks it is due to gas as he says he goes all day without eating.  Blood pressure 165/90 today.  Says he sees his primary care physician once every month.   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 Moved to [place] 11 years ago to be near grandchildren.    No Known Allergies  Current Outpatient Prescriptions  Medication Sig Dispense Refill  . allopurinol (ZYLOPRIM) 300 MG tablet TAKE ONE TABLET BY MOUTH EVERY DAY 90 tablet 5  . aspirin EC 81 MG tablet Take 81 mg by mouth daily.    . celecoxib (CELEBREX) 200 MG capsule Take 200 mg by mouth daily.     . cloNIDine (CATAPRES - DOSED IN MG/24 HR) 0.2 mg/24hr patch PLACE 1 PATCH ONTO THE SKIN ONCE A WEEK 2 patch 0  . diltiazem (CARTIA XT) 180 MG 24  hr capsule Take 1 capsule (180 mg total) by mouth daily. 15 capsule 0  . erythromycin ophthalmic ointment Place a 1/2 inch ribbon of ointment into the lower eyelid. 3.5 g 0  . ketorolac (ACULAR) 0.5 % ophthalmic solution Place 1 drop into both eyes every 6 (six) hours. 10 mL 0  . LORazepam (ATIVAN) 1 MG tablet Take 1 mg by mouth at bedtime.    . metoprolol succinate (TOPROL-XL) 25 MG 24 hr tablet TAKE 3 TABLETS BY MOUTH DAILY TAKE WITH OR IMMEDIATELY FOLLOWING A MEAL 30 tablet 0  . nabumetone (RELAFEN) 750 MG tablet Take 750 mg by mouth daily.    . naproxen (NAPROSYN) 500 MG tablet Take 500 mg by mouth 2 (two) times daily with a meal.    . nitroGLYCERIN (NITROSTAT) 0.4 MG SL tablet Place 1 tablet (0.4 mg total) under the tongue every 5 (five) minutes as needed. 25 tablet 6  . omeprazole (PRILOSEC) 20 MG capsule Take 20 mg by mouth daily.    . pregabalin (LYRICA) 75 MG capsule Take 75 mg by mouth 2 (two) times daily.    . QUEtiapine (SEROQUEL) 200 MG tablet Take 200 mg by mouth at bedtime.    . simvastatin (ZOCOR) 80 MG tablet Take 1 tablet (80 mg total) by mouth daily. 15 tablet 0  . tadalafil (CIALIS) 5 MG tablet Take 5 mg by mouth daily as needed for erectile dysfunction.    . traMADol-acetaminophen (ULTRACET) 37.5-325 MG per tablet Take  1 tablet by mouth every 4 (four) hours as needed. 90 tablet 5   No current facility-administered medications for this visit.     Past Medical History:  Diagnosis Date  . Arteriosclerotic cardiovascular disease (ASCVD) 1993   Acute MI->PCI in 1993; asymptomatic PVCs  . ASCVD (arteriosclerotic cardiovascular disease) 1993   Status post MI and percutaneous intervention  . Colonic polyp    excised via colonoscope  . Degenerative joint disease    shoulder  . ED (erectile dysfunction)    Secondary to medication  . Hyperglycemia   . Hyperlipidemia   . Hypertension   . Obesity   . Testosterone deficiency    Erectile dysfunction    Past Surgical  History:  Procedure Laterality Date  . CATARACT EXTRACTION, BILATERAL    . COLONOSCOPY W/ POLYPECTOMY  04/12/2010   IRJ:JOACZY rectum/Pancolonic diverticula/Polyp at hepatic flexure and cecum, resected with snare technique as above  . KNEE ARTHROSCOPY     Arthroscopic surgery x 2  . SHOULDER SURGERY    . TRANSURETHRAL RESECTION OF BLADDER      Social History   Social History  . Marital status: Married    Spouse name: N/A  . Number of children: 2  . Years of education: N/A   Occupational History  . [employer]     Retired   Social History Main Topics  . Smoking status: Never Smoker  . Smokeless tobacco: Never Used  . Alcohol use No     Comment: none in 40 years  . Drug use: No  . Sexual activity: Not on file   Other Topics Concern  . Not on file   Social History Narrative  . No narrative on file     No family history of premature CAD in 1st degree relatives.  Current Meds  Medication Sig  . allopurinol (ZYLOPRIM) 300 MG tablet TAKE ONE TABLET BY MOUTH EVERY DAY  . aspirin EC 81 MG tablet Take 81 mg by mouth daily.  . celecoxib (CELEBREX) 200 MG capsule Take 200 mg by mouth daily.   . cloNIDine (CATAPRES - DOSED IN MG/24 HR) 0.2 mg/24hr patch PLACE 1 PATCH ONTO THE SKIN ONCE A WEEK  . diltiazem (CARTIA XT) 180 MG 24 hr capsule Take 1 capsule (180 mg total) by mouth daily.  Marland Kitchen erythromycin ophthalmic ointment Place a 1/2 inch ribbon of ointment into the lower eyelid.  Marland Kitchen ketorolac (ACULAR) 0.5 % ophthalmic solution Place 1 drop into both eyes every 6 (six) hours.  Marland Kitchen LORazepam (ATIVAN) 1 MG tablet Take 1 mg by mouth at bedtime.  . metoprolol succinate (TOPROL-XL) 25 MG 24 hr tablet TAKE 3 TABLETS BY MOUTH DAILY TAKE WITH OR IMMEDIATELY FOLLOWING A MEAL  . nabumetone (RELAFEN) 750 MG tablet Take 750 mg by mouth daily.  . naproxen (NAPROSYN) 500 MG tablet Take 500 mg by mouth 2 (two) times daily with a meal.  . nitroGLYCERIN (NITROSTAT) 0.4 MG SL tablet Place 1 tablet (0.4  mg total) under the tongue every 5 (five) minutes as needed.  Marland Kitchen omeprazole (PRILOSEC) 20 MG capsule Take 20 mg by mouth daily.  . pregabalin (LYRICA) 75 MG capsule Take 75 mg by mouth 2 (two) times daily.  . QUEtiapine (SEROQUEL) 200 MG tablet Take 200 mg by mouth at bedtime.  . simvastatin (ZOCOR) 80 MG tablet Take 1 tablet (80 mg total) by mouth daily.  . tadalafil (CIALIS) 5 MG tablet Take 5 mg by mouth daily as needed for erectile dysfunction.  Marland Kitchen  traMADol-acetaminophen (ULTRACET) 37.5-325 MG per tablet Take 1 tablet by mouth every 4 (four) hours as needed.      Review of systems complete and found to be negative unless listed above in HPI    Physical exam Pulse 90, height _0  (1.702 m), weight 191 lb (86.6 kg), SpO2 94 %. General: NAD Neck: No JVD, no thyromegaly or thyroid nodule.  Lungs: Clear to auscultation bilaterally with normal respiratory effort. CV: Nondisplaced PMI. Regular rate and rhythm, normal S1/S2, no S3/S4, no murmur.  No peripheral edema.  No carotid bruit.    Abdomen: Soft, nontender, no distention.  Skin: Intact without lesions or rashes.  Neurologic: Alert and oriented x 3.  Psych: Normal affect. Extremities: No clubbing or cyanosis.  HEENT: Normal.  Musculoskeletal: Right knee is swollen.  ECG: Most recent ECG reviewed.   Labs: Lab Results  Component Value Date/Time   K 4.3 05/29/2013 06:42 PM   BUN 11 05/29/2013 06:42 PM   CREATININE 1.08 05/29/2013 06:42 PM   CREATININE 1.44 (H) 01/12/2013 08:33 AM   ALT 20 05/29/2013 06:42 PM   TSH 1.499 01/22/2011 02:45 PM   HGB 14.0 05/29/2013 06:42 PM     Lipids: Lab Results  Component Value Date/Time   LDLCALC 64 05/28/2013 09:35 AM   CHOL 117 05/28/2013 09:35 AM   TRIG 82 05/28/2013 09:35 AM   HDL 37 (L) 05/28/2013 09:35 AM        ASSESSMENT AND PLAN:  1. Preoperative risk stratification: It is unclear what his exercise tolerance is as he is limited by right knee pain. I will proceed with a  Lexiscan Myoview stress test to more accurately risk stratify him prior to right TKA.  2. CAD with prior MI and exertional dyspnea: Unclear if symptoms are anginal equivalent. I will obtain a Lexiscan Myoview stress test for further clarification. Currently on ASA, metoprolol, and simvastatin.  3. Hypertension: Elevated today. Antihypertensive therapy may need further adjustments. Will monitor.  4. Hyperlipidemia: Continue simvastatin.   Disposition: Follow up in 6 months  Signed: Kate Sable, M.D., F.A.C.C.  08/14/2016, 1:09 PM

## 2016-08-14 NOTE — Patient Instructions (Signed)
Your physician wants you to follow-up in: 6 months You will receive a reminder letter in the mail two months in advance. If you don't receive a letter, please call our office to schedule the follow-up appointment     Your physician has requested that you have a lexiscan myoview. For further information please visit [email or web address]. Please follow instruction sheet, as given.   Your physician recommends that you continue on your current medications as directed. Please refer to the Current Medication list given to you today.    If you need a refill on your cardiac medications before your next appointment, please call your pharmacy.       Thank you for choosing [facility] !

## 2016-08-21 ENCOUNTER — Encounter (HOSPITAL_BASED_OUTPATIENT_CLINIC_OR_DEPARTMENT_OTHER)
Admission: RE | Admit: 2016-08-21 | Discharge: 2016-08-21 | Disposition: A | Discharge status: Home / Self Care | Payer: Medicare HMO | Source: Ambulatory Visit | Attending: Cardiovascular Disease | Admitting: Cardiovascular Disease

## 2016-08-21 ENCOUNTER — Encounter (HOSPITAL_COMMUNITY)
Admission: RE | Admit: 2016-08-21 | Discharge: 2016-08-21 | Disposition: A | Discharge status: Home / Self Care | Payer: Medicare HMO | Source: Ambulatory Visit | Attending: Cardiovascular Disease | Admitting: Cardiovascular Disease

## 2016-08-21 ENCOUNTER — Encounter (HOSPITAL_COMMUNITY): Payer: Self-pay

## 2016-08-21 DIAGNOSIS — I25118 Atherosclerotic heart disease of native coronary artery with other forms of angina pectoris: Secondary | ICD-10-CM | POA: Diagnosis not present

## 2016-08-21 DIAGNOSIS — Z01818 Encounter for other preprocedural examination: Secondary | ICD-10-CM | POA: Insufficient documentation

## 2016-08-21 LAB — NM MYOCAR MULTI W/SPECT W/WALL MOTION / EF
CHL CUP NUCLEAR SRS: 13
CHL CUP RESTING HR STRESS: 81 {beats}/min
LHR: 0.25
LV dias vol: 102 mL (ref 62–150)
LV sys vol: 54 mL
NUC STRESS TID: 0.95
Peak HR: 90 {beats}/min
SDS: 7
SSS: 20

## 2016-08-21 MED ORDER — SODIUM CHLORIDE 0.9% FLUSH
INTRAVENOUS | Status: AC
  Administered 2016-08-21: 10 mL via INTRAVENOUS
  Filled 2016-08-21: qty 10

## 2016-08-21 MED ORDER — TECHNETIUM TC 99M TETROFOSMIN IV KIT
10.0000 | PACK | Freq: Once | INTRAVENOUS | Status: AC | PRN
  Administered 2016-08-21: 10.2 via INTRAVENOUS

## 2016-08-21 MED ORDER — TECHNETIUM TC 99M TETROFOSMIN IV KIT
30.0000 | PACK | Freq: Once | INTRAVENOUS | Status: AC | PRN
  Administered 2016-08-21: 29.7 via INTRAVENOUS

## 2016-08-21 MED ORDER — REGADENOSON 0.4 MG/5ML IV SOLN
INTRAVENOUS | Status: AC
  Administered 2016-08-21: 0.4 mg via INTRAVENOUS
  Filled 2016-08-21: qty 5

## 2016-08-22 ENCOUNTER — Telehealth: Payer: Self-pay

## 2016-08-22 MED ORDER — METOPROLOL SUCCINATE ER 100 MG PO TB24: 100.0000 mg | ORAL_TABLET | Freq: Every day | ORAL | 3 refills | Status: DC

## 2016-08-22 NOTE — Telephone Encounter (Signed)
-----   Message from Laqueta LindenSuresh A Koneswaran, MD sent at 08/21/2016  4:17 PM EDT ----- Abnormal results which makes me suspect his symptoms were cardiac. If he is taking Toprol-XL 75 mg, I would increase to 100 mg to see if this improves his symptoms. He is at an intermediate risk for planned orthopedic surgery.

## 2016-08-22 NOTE — Telephone Encounter (Signed)
Pt given results, will increase Toprol XL to 100 mg daily,requests 90 day supply,e-scribed to [facility].Will let us know if he feels better.

## 2016-12-03 ENCOUNTER — Telehealth: Payer: Self-pay | Admitting: Orthopaedic Surgery

## 2017-02-22 ENCOUNTER — Ambulatory Visit: Payer: Self-pay | Admitting: Orthopedic Surgery

## 2017-03-08 NOTE — Pre-Procedure Instructions (Signed)
Skip EstimableClyde M Hogsett  03/08/2017      [facility] [id number] - [place], [place] - [address] [place] [address] Phone: [phone] Fax: [phone]  [facility] [address] - [place], [place] - [address] [place] [address] [place] [address] [place] [id number] Phone: [phone] Fax: [phone]    Your procedure is scheduled on Monday December 31.  [facility].M.  Call this number if you have problems the morning of surgery:  [phone]   Remember:  Do not eat food or drink liquids after midnight.  Take these medicines the morning of surgery with A SIP OF WATER:   Allopurinol (zyloprim) Diltiazem (Cartia) Metoprolol (toprol-XL) Omeprazole (prilosec) Pregabalin (lyrica) Tramadol-acetaminophen (Ultracet) if needed  7 days prior to surgery STOP taking any  Relafen (nabumetone), celebrex, Aleve, Naproxen, Ibuprofen, Motrin, Advil, Goody's, BC's, all herbal medications, fish oil, and all vitamins  FOLLOW Surgeon's instructions on stopping Aspirin. If no instructions were given, please call surgeon's office.      Do not wear jewelry, make-up or nail polish.  Do not wear lotions, powders, or perfumes, or deodorant.  Do not shave 48 hours prior to surgery.  Men may shave face and neck.  Do not bring valuables to the hospital.  [facility] is not responsible for any belongings or valuables.  Contacts, dentures or bridgework may not be worn into surgery.  Leave your suitcase in the car.  After surgery it may be brought to your room.  For patients admitted to the hospital, discharge time will be determined by your treatment team.  Patients discharged the day of surgery will not be allowed to drive home.   Special instructions:    [facility]- Preparing For Surgery  Before surgery, you can play an important role. Because skin is not sterile, your skin needs to be as free  of germs as possible. You can reduce the number of germs on your skin by washing with CHG (chlorahexidine gluconate) Soap before surgery.  CHG is an antiseptic cleaner which kills germs and bonds with the skin to continue killing germs even after washing.  Please do not use if you have an allergy to CHG or antibacterial soaps. If your skin becomes reddened/irritated stop using the CHG.  Do not shave (including legs and underarms) for at least 48 hours prior to first CHG shower. It is OK to shave your face.  Please follow these instructions carefully.   1. Shower the NIGHT BEFORE SURGERY and the MORNING OF SURGERY with CHG.   2. If you chose to wash your hair, wash your hair first as usual with your normal shampoo.  3. After you shampoo, rinse your hair and body thoroughly to remove the shampoo.  4. Use CHG as you would any other liquid soap. You can apply CHG directly to the skin and wash gently with a scrungie or a clean washcloth.   5. Apply the CHG Soap to your body ONLY FROM THE NECK DOWN.  Do not use on open wounds or open sores. Avoid contact with your eyes, ears, mouth and genitals (private parts). Wash Face and genitals (private parts)  with your normal soap.  6. Wash thoroughly, paying special attention to the area where your surgery will be performed.  7. Thoroughly rinse your body with warm water from the neck down.  8. DO NOT shower/wash with  your normal soap after using and rinsing off the CHG Soap.  9. Pat yourself dry with a CLEAN TOWEL.  10. Wear CLEAN PAJAMAS to bed the night before surgery, wear comfortable clothes the morning of surgery  11. Place CLEAN SHEETS on your bed the night of your first shower and DO NOT SLEEP WITH PETS.    Day of Surgery: Do not apply any deodorants/lotions. Please wear clean clothes to the hospital/surgery center.      Please read over the following fact sheets that you were given. Coughing and Deep Breathing and Surgical Site  Infection Prevention

## 2017-03-11 ENCOUNTER — Encounter (HOSPITAL_COMMUNITY): Payer: Self-pay | Admitting: Emergency Medicine

## 2017-03-11 ENCOUNTER — Other Ambulatory Visit: Payer: Self-pay

## 2017-03-11 ENCOUNTER — Encounter (HOSPITAL_COMMUNITY): Payer: Self-pay

## 2017-03-11 ENCOUNTER — Encounter (HOSPITAL_COMMUNITY)
Admission: RE | Admit: 2017-03-11 | Discharge: 2017-03-11 | Disposition: A | Discharge status: Home / Self Care | Payer: Medicare HMO | Source: Ambulatory Visit | Attending: Orthopedic Surgery | Admitting: Orthopedic Surgery

## 2017-03-11 DIAGNOSIS — Z01812 Encounter for preprocedural laboratory examination: Secondary | ICD-10-CM | POA: Insufficient documentation

## 2017-03-11 DIAGNOSIS — M1711 Unilateral primary osteoarthritis, right knee: Secondary | ICD-10-CM | POA: Diagnosis not present

## 2017-03-11 HISTORY — DX: Acute myocardial infarction, unspecified: I21.9

## 2017-03-11 HISTORY — DX: Cerebral infarction, unspecified: I63.9

## 2017-03-11 HISTORY — DX: Headache, unspecified: R51.9

## 2017-03-11 HISTORY — DX: Headache: R51

## 2017-03-11 HISTORY — DX: Atherosclerotic heart disease of native coronary artery without angina pectoris: I25.10

## 2017-03-11 LAB — BASIC METABOLIC PANEL
Anion gap: 8 (ref 5–15)
BUN: 12 mg/dL (ref 6–20)
CHLORIDE: 108 mmol/L (ref 101–111)
CO2: 24 mmol/L (ref 22–32)
Calcium: 9.3 mg/dL (ref 8.9–10.3)
Creatinine, Ser: 1.3 mg/dL — ABNORMAL HIGH (ref 0.61–1.24)
GFR calc Af Amer: 56 mL/min — ABNORMAL LOW (ref >60)
GFR calc non Af Amer: 48 mL/min — ABNORMAL LOW (ref >60)
GLUCOSE: 109 mg/dL — AB (ref 65–99)
POTASSIUM: 4.1 mmol/L (ref 3.5–5.1)
Sodium: 140 mmol/L (ref 135–145)

## 2017-03-11 LAB — CBC
HEMATOCRIT: 36 % — AB (ref 39.0–52.0)
HEMOGLOBIN: 11.9 g/dL — AB (ref 13.0–17.0)
MCH: 33.1 pg (ref 26.0–34.0)
MCHC: 33.1 g/dL (ref 30.0–36.0)
MCV: 100.3 fL — AB (ref 78.0–100.0)
Platelets: 161 10*3/uL (ref 150–400)
RBC: 3.59 MIL/uL — ABNORMAL LOW (ref 4.22–5.81)
RDW: 13.8 % (ref 11.5–15.5)
WBC: 4.1 10*3/uL (ref 4.0–10.5)

## 2017-03-11 LAB — SURGICAL PCR SCREEN
MRSA, PCR: NEGATIVE
STAPHYLOCOCCUS AUREUS: NEGATIVE

## 2017-03-11 LAB — TYPE AND SCREEN
ABO/RH(D): O POS
Antibody Screen: NEGATIVE

## 2017-03-11 LAB — ABO/RH: ABO/RH(D): O POS

## 2017-03-11 NOTE — Progress Notes (Signed)
PCP - Oval Linseyichard Dondiego Cardiologist - Koneswaran  EKG - 08/14/16 Stress Test - 08/21/16 ECHO - 04/18/2009 Cardiac Cath - PCI 1993 [place] per Dr. Junius ArgyleKoneswaran's note  Aspirin Instructions: Pt to see Dr. Linna CapriceSwinteck tomorrow. Pt and pt grandaughter to ask Dr. Linna CapriceSwinteck for instructions regarding Aspirin.   Anesthesia review: hx CAD with PCI,  review cardiac clearance note from 08/14/16. Pt states since he saw Dr. Purvis SheffieldKoneswaran he has not felt different. Pt states he does not have chest pain, but does get SOB with exertion easily. Per cardiac clearance note, pt was to follow up in 6 months.   Patient denies shortness of breath, fever, cough and chest pain at PAT appointment  Patient verbalized understanding of instructions that were given to them at the PAT appointment. Patient was also instructed that they will need to review over the PAT instructions again at home before surgery.

## 2017-03-12 ENCOUNTER — Ambulatory Visit: Payer: Self-pay | Admitting: Orthopedic Surgery

## 2017-03-12 NOTE — H&P (Signed)
TOTAL KNEE ADMISSION H&P  Patient is being admitted for right total knee arthroplasty.  Subjective:  Chief Complaint:right knee pain.  HPI: Glenn Brooks, 83 y.o. male, hasSkip Estimable a history of pain and functional disability in the right knee due to arthritis and has failed non-surgical conservative treatments for greater than 12 weeks to includeNSAID's and/or analgesics, corticosteriod injections, flexibility and strengthening excercises, use of assistive devices, weight reduction as appropriate and activity modification.  Onset of symptoms was gradual, starting >10 years ago with gradually worsening course since that time. The patient noted no past surgery on the right knee(s).  Patient currently rates pain in the right knee(s) at 10 out of 10 with activity. Patient has night pain, worsening of pain with activity and weight bearing, pain that interferes with activities of daily living, pain with passive range of motion, crepitus and joint swelling.  Patient has evidence of subchondral cysts, subchondral sclerosis, periarticular osteophytes and joint space narrowing by imaging studies. There is no active infection.  Patient Active Problem List   Diagnosis Date Noted  . Primary osteoarthritis of both knees 01/07/2014  . Odynophagia 06/08/2013  . GERD (gastroesophageal reflux disease) 06/08/2013  . HYPERLIPIDEMIA 02/10/2010  . HYPERTENSION 03/24/2009  . ATHEROSCLEROTIC CARDIOVASCULAR DISEASE 03/24/2009  . DEGENERATIVE JOINT DISEASE 03/24/2009  . HYPERGLYCEMIA 03/24/2009   Past Medical History:  Diagnosis Date  . Arteriosclerotic cardiovascular disease (ASCVD) 1993   Acute MI->PCI in 1993; asymptomatic PVCs  . ASCVD (arteriosclerotic cardiovascular disease) 1993   Status post MI and percutaneous intervention  . Colonic polyp    excised via colonoscope  . Coronary artery disease   . Degenerative joint disease    shoulder  . ED (erectile dysfunction)    Secondary to medication  . Headache    . Hyperglycemia   . Hyperlipidemia   . Hypertension   . Myocardial infarction (HCC)   . Obesity   . Stroke [facility](HCC)    "mini stroke, Bell's palsy"   . Testosterone deficiency    Erectile dysfunction    Past Surgical History:  Procedure Laterality Date  . CATARACT EXTRACTION, BILATERAL    . COLONOSCOPY W/ POLYPECTOMY  04/12/2010   JXB:[id number]:Normal rectum/Pancolonic diverticula/Polyp at hepatic flexure and cecum, resected with snare technique as above  . KNEE ARTHROSCOPY     Arthroscopic surgery x 2  . SHOULDER SURGERY    . TRANSURETHRAL RESECTION OF BLADDER      Current Outpatient Medications  Medication Sig Dispense Refill Last Dose  . acetaminophen (TYLENOL) 500 MG tablet Take 500-1,000 mg by mouth daily as needed for moderate pain or headache.     . allopurinol (ZYLOPRIM) 300 MG tablet TAKE ONE TABLET BY MOUTH ONCE DAILY 90 tablet 5   . aspirin EC 81 MG tablet Take 81 mg by mouth daily.   Taking  . cetirizine (ZYRTEC) 10 MG tablet Take 10 mg by mouth daily.     . cloNIDine (CATAPRES - DOSED IN MG/24 HR) 0.2 mg/24hr patch PLACE 1 PATCH ONTO THE SKIN ONCE A WEEK (Patient not taking: Reported on 03/08/2017) 2 patch 0 Not Taking at Unknown time  . diltiazem (CARTIA XT) 180 MG 24 hr capsule Take 1 capsule (180 mg total) by mouth daily. 15 capsule 0 Taking  . erythromycin ophthalmic ointment Place a 1/2 inch ribbon of ointment into the lower eyelid. (Patient not taking: Reported on 03/08/2017) 3.5 g 0 Completed Course at Unknown time  . ketorolac (ACULAR) 0.5 % ophthalmic solution Place 1 drop into both  eyes every 6 (six) hours. (Patient taking differently: Place 1 drop into both eyes daily. ) 10 mL 0 Taking  . latanoprost (XALATAN) 0.005 % ophthalmic solution Place 1 drop into both eyes at bedtime.     Marland Kitchen. LORazepam (ATIVAN) 2 MG tablet Take 2 mg by mouth at bedtime.     . metoprolol succinate (TOPROL-XL) 100 MG 24 hr tablet Take 1 tablet (100 mg total) by mouth daily. Take with or immediately  following a meal. 90 tablet 3   . naproxen (NAPROSYN) 500 MG tablet Take 500 mg by mouth 2 (two) times daily with a meal.   Taking  . naproxen sodium (ALEVE) 220 MG tablet Take 220 mg by mouth daily as needed (pain).     . nitroGLYCERIN (NITROSTAT) 0.4 MG SL tablet Place 1 tablet (0.4 mg total) under the tongue every 5 (five) minutes as needed. (Patient not taking: Reported on 03/08/2017) 25 tablet 6 Not Taking at Unknown time  . omeprazole (PRILOSEC) 20 MG capsule Take 20 mg by mouth daily.   Taking  . silodosin (RAPAFLO) 8 MG CAPS capsule Take 8 mg by mouth daily.     . simvastatin (ZOCOR) 80 MG tablet Take 1 tablet (80 mg total) by mouth daily. 15 tablet 0 Taking  . traMADol-acetaminophen (ULTRACET) 37.5-325 MG per tablet Take 1 tablet by mouth every 4 (four) hours as needed. (Patient not taking: Reported on 03/08/2017) 90 tablet 5 Not Taking at Unknown time   No current facility-administered medications for this visit.    No Known Allergies  Social History   Tobacco Use  . Smoking status: Never Smoker  . Smokeless tobacco: Never Used  Substance Use Topics  . Alcohol use: No    Comment: none in 40 years    No family history on file.   Review of Systems  Constitutional: Negative.   Eyes: Negative.   Respiratory: Negative.   Cardiovascular: Negative.   Gastrointestinal: Negative.   Genitourinary: Negative.   Musculoskeletal: Positive for back pain and joint pain.  Skin: Negative.   Neurological: Negative.   Endo/Heme/Allergies: Negative.   Psychiatric/Behavioral: Negative.     Objective:  Physical Exam  Vitals reviewed. Constitutional: He is oriented to person, place, and time. He appears well-developed and well-nourished.  HENT:  Head: Normocephalic and atraumatic.  Eyes: Conjunctivae and EOM are normal. Pupils are equal, round, and reactive to light.  Neck: Normal range of motion. Neck supple.  Cardiovascular: Normal rate, regular rhythm and intact distal pulses.   Respiratory: Effort normal. No respiratory distress.  GI: Soft. He exhibits no distension.  Genitourinary:  Genitourinary Comments: deferred  Musculoskeletal:       Right knee: He exhibits decreased range of motion, swelling, effusion, deformity and abnormal alignment. Tenderness found. Medial joint line and lateral joint line tenderness noted.  Neurological: He is alert and oriented to person, place, and time. He has normal reflexes.  Skin: Skin is warm and dry.  Psychiatric: He has a normal mood and affect. His behavior is normal. Judgment and thought content normal.    Vital signs in last 24 hours: @VSRANGES @  Labs:   Estimated body mass index is 31 kg/m as calculated from the following:   Height as of 03/11/17: 5\' 7"  (1.702 m).   Weight as of 03/11/17: 89.8 kg (197 lb 14.4 oz).   Imaging Review Plain radiographs demonstrate severe degenerative joint disease of the right knee(s). The overall alignment issignificant valgus. The bone quality appears to be adequate for  age and reported activity level.  Assessment/Plan:  End stage arthritis, right knee   The patient history, physical examination, clinical judgment of the provider and imaging studies are consistent with end stage degenerative joint disease of the right knee(s) and total knee arthroplasty is deemed medically necessary. The treatment options including medical management, injection therapy arthroscopy and arthroplasty were discussed at length. The risks and benefits of total knee arthroplasty were presented and reviewed. The risks due to aseptic loosening, infection, stiffness, patella tracking problems, thromboembolic complications and other imponderables were discussed. The patient acknowledged the explanation, agreed to proceed with the plan and consent was signed. Patient is being admitted for inpatient treatment for surgery, pain control, PT, OT, prophylactic antibiotics, VTE prophylaxis, progressive ambulation and  ADL's and discharge planning. The patient is planning to be discharged home with HEP

## 2017-03-13 NOTE — Progress Notes (Addendum)
Anesthesia Chart Review:  Pt is an 83 year old male scheduled for R total knee arthroplasty with computer navigation on 03/25/2017 with Samson FredericBrian Swinteck, MD  - PCP is Oval Linseyichard Dondiego, MD - Cardiologist is Prentice DockerSuresh Koneswaran, MD. Last office visit 08/14/16.   PMH includes:  CAD (MI, PCI 1993), HTN, hyperglycemia, hyperlipidemia. Never smoker. BMI 31. S/p R shoulder arthroscopy 11/08/11.   Medications include: ASA 81 mg, diltiazem, metoprolol, Prilosec, simvastatin  BP (!) 144/52   Pulse 81   Temp 36.7 C   Resp 20   Ht 5\' 7"  (1.702 m)   Wt 197 lb 14.4 oz (89.8 kg)   SpO2 100%   BMI 31.00 kg/m   Preoperative labs reviewed.    EKG 08/14/16:  - Sinus  Rhythm  - occasional ectopic ventricular beat. RBBB with left axis -bifascicular block.  Old inferolateral infarct  Nuclear stress test 08/21/16:   No diagnostic ST segment changes to indicate ischemia.  Large, severe intensity, apical to basal inferior and inferolateral defect. This region is partially reversible in the inferoseptal segment and inferolateral segment consistent with scar and mild to moderate peri-infarct ischemia mainly toward the apex.  This is an intermediate risk study.  Nuclear stress EF: 47% - Dr. Purvis SheffieldKoneswaran comments: "Abnormal results which makes me suspect his symptoms were cardiac. If he is taking Toprol-XL 75 mg, I would increase to 100 mg to see if this improves his symptoms. He is at an intermediate risk for planned orthopedic surgery"   Carotid duplex 07/10/07:  - Plaque formation bilaterally in the carotid systems involving bilateral carotid bulbs into proximal ICAs. - Associated mildly turbulent blood flow without evidence of hemodynamically significant stenosis.   Pt was evaluated by cardiologist Dr. Purvis SheffieldKoneswaran last May for exertional SOB and pre-op eval for knee replacement.  Stress test done, results abnormal.  In comment on stress test, Dr. Purvis SheffieldKoneswaran increased At pre-admission testing, pt complained of  SOB with exertion, same as when he saw Dr. Purvis SheffieldKoneswaran last spring.   Given pt reported similar to sx compared to when he last saw Dr. Purvis SheffieldKoneswaran, and given abnormal stress test results, and Dr. Junius ArgyleKoneswaran's interpretation that SOB likely cardiac, pt will need to f/u with Dr. Purvis SheffieldKoneswaran prior to surgery.  Pt has appointment scheduled 03/15/17.  Will revisit chart after that appointment.   Rica Mastngela Loreda Silverio, FNP-BC [facility]/Anesthesiology Phone: [phone] 03/14/2017 3:17 PM   Addendum:   Pt saw Dr. Purvis SheffieldKoneswaran 03/15/17 and was cleared for surgery at moderate risk.   If no changes, I anticipate pt can proceed with surgery as scheduled.   Rica Mastngela Ilario Dhaliwal, FNP-BC [facility]esthesiology Phone: [phone] 03/15/2017 2:16 PM

## 2017-03-15 ENCOUNTER — Ambulatory Visit: Payer: Medicare HMO | Admitting: Cardiovascular Disease

## 2017-03-15 ENCOUNTER — Encounter: Payer: Self-pay | Admitting: Cardiovascular Disease

## 2017-03-15 VITALS — BP 120/58 | HR 80 | Ht 67.0 in | Wt 198.0 lb

## 2017-03-15 DIAGNOSIS — R0609 Other forms of dyspnea: Secondary | ICD-10-CM | POA: Diagnosis not present

## 2017-03-15 DIAGNOSIS — Z01818 Encounter for other preprocedural examination: Secondary | ICD-10-CM

## 2017-03-15 DIAGNOSIS — I25118 Atherosclerotic heart disease of native coronary artery with other forms of angina pectoris: Secondary | ICD-10-CM

## 2017-03-15 DIAGNOSIS — I10 Essential (primary) hypertension: Secondary | ICD-10-CM

## 2017-03-15 DIAGNOSIS — E782 Mixed hyperlipidemia: Secondary | ICD-10-CM | POA: Diagnosis not present

## 2017-03-15 MED ORDER — METOPROLOL SUCCINATE ER 100 MG PO TB24: 100.0000 mg | ORAL_TABLET | Freq: Every day | ORAL | 3 refills | Status: DC

## 2017-03-15 NOTE — Progress Notes (Signed)
SUBJECTIVE: The patient presents for routine follow-up. He has a history of coronary artery disease and prior myocardial infarction (PCI in [place] in 1993), CVA, hypertension, hyperlipidemia, and DJD.  Nuclear stress test 08/21/16 demonstrated a large defect in the apical to basal inferior and inferolateral walls.  There was partial reversibility in the inferoseptal segment and inferolateral segment consistent with scar and mild to moderate peri-infarct ischemia mainly toward the apex.  It was deemed an intermediate risk study.  LVEF 47%.  CBC 03/11/17: Hemoglobin 11.9, white blood cells 4.1.  After his stress test in May, I increase his Toprol-XL to 100 mg daily and commented that he would be at an intermediate risk for cardiac events in the perioperative period for right knee total arthroplasty.  He seldom has chest pain and has not had to use nitroglycerin.  He has exertional dyspnea and attributes this to the fact that he has bilateral knee pain and has not been able to walk much.  He is eager to have his surgery done but has some concerns.  It is being scheduled for 03/25/17.   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 Moved to [place] 11 years ago to be near grandchildren.   Review of Systems: As per "subjective", otherwise negative.  No Known Allergies  Current Outpatient Medications  Medication Sig Dispense Refill  . acetaminophen (TYLENOL) 500 MG tablet Take 500-1,000 mg by mouth daily as needed for moderate pain or headache.    . allopurinol (ZYLOPRIM) 300 MG tablet TAKE ONE TABLET BY MOUTH ONCE DAILY 90 tablet 5  . aspirin EC 81 MG tablet Take 81 mg by mouth daily.    . cetirizine (ZYRTEC) 10 MG tablet Take 10 mg by mouth daily.    . cloNIDine (CATAPRES - DOSED IN MG/24 HR) 0.2 mg/24hr patch PLACE 1 PATCH ONTO THE SKIN ONCE A WEEK 2 patch 0  . diltiazem (CARTIA XT) 180 MG 24 hr capsule Take 1 capsule (180 mg total) by mouth  daily. 15 capsule 0  . ketorolac (ACULAR) 0.5 % ophthalmic solution Place 1 drop into both eyes every 6 (six) hours. (Patient taking differently: Place 1 drop into both eyes daily. ) 10 mL 0  . latanoprost (XALATAN) 0.005 % ophthalmic solution Place 1 drop into both eyes at bedtime.    Marland Kitchen. LORazepam (ATIVAN) 2 MG tablet Take 2 mg by mouth at bedtime.    . naproxen (NAPROSYN) 500 MG tablet Take 500 mg by mouth 2 (two) times daily with a meal.    . naproxen sodium (ALEVE) 220 MG tablet Take 220 mg by mouth daily as needed (pain).    . nitroGLYCERIN (NITROSTAT) 0.4 MG SL tablet Place 1 tablet (0.4 mg total) under the tongue every 5 (five) minutes as needed. 25 tablet 6  . omeprazole (PRILOSEC) 20 MG capsule Take 20 mg by mouth daily.    . silodosin (RAPAFLO) 8 MG CAPS capsule Take 8 mg by mouth daily.    . simvastatin (ZOCOR) 80 MG tablet Take 1 tablet (80 mg total) by mouth daily. 15 tablet 0  . metoprolol succinate (TOPROL-XL) 100 MG 24 hr tablet Take 1 tablet (100 mg total) by mouth daily. Take with or immediately following a meal. 90 tablet 3   No current facility-administered medications for this visit.     Past Medical History:  Diagnosis Date  . Arteriosclerotic cardiovascular disease (ASCVD) 1993   Acute MI->PCI in  1993; asymptomatic PVCs  . ASCVD (arteriosclerotic cardiovascular disease) 1993   Status post MI and percutaneous intervention  . Colonic polyp    excised via colonoscope  . Coronary artery disease   . Degenerative joint disease    shoulder  . ED (erectile dysfunction)    Secondary to medication  . Headache   . Hyperglycemia   . Hyperlipidemia   . Hypertension   . Myocardial infarction (HCC)   . Obesity   . Stroke [facility](HCC)    "mini stroke, Bell's palsy"   . Testosterone deficiency    Erectile dysfunction    Past Surgical History:  Procedure Laterality Date  . CATARACT EXTRACTION, BILATERAL    . COLONOSCOPY W/ POLYPECTOMY  04/12/2010   VQQ:[id number]:Normal  rectum/Pancolonic diverticula/Polyp at hepatic flexure and cecum, resected with snare technique as above  . KNEE ARTHROSCOPY     Arthroscopic surgery x 2  . SHOULDER SURGERY    . TRANSURETHRAL RESECTION OF BLADDER      Social History   Socioeconomic History  . Marital status: Married    Spouse name: Not on file  . Number of children: 2  . Years of education: Not on file  . Highest education level: Not on file  Social Needs  . Financial resource strain: Not on file  . Food insecurity - worry: Not on file  . Food insecurity - inability: Not on file  . Transportation needs - medical: Not on file  . Transportation needs - non-medical: Not on file  Occupational History  . Occupation: MidwifeButcher    Comment: Retired  Tobacco Use  . Smoking status: Never Smoker  . Smokeless tobacco: Never Used  Substance and Sexual Activity  . Alcohol use: No    Comment: none in 40 years  . Drug use: No  . Sexual activity: Not on file  Other Topics Concern  . Not on file  Social History Narrative  . Not on file     Vitals:   03/15/17 0832  BP: (!) 120/58  Pulse: 80  SpO2: 98%  Weight: 198 lb (89.8 kg)  Height: 5\' 7"  (1.702 m)    Wt Readings from Last 3 Encounters:  03/15/17 198 lb (89.8 kg)  03/11/17 197 lb 14.4 oz (89.8 kg)  08/14/16 191 lb (86.6 kg)     PHYSICAL EXAM General: NAD HEENT: Normal. Neck: No JVD, no thyromegaly. Lungs: Clear to auscultation bilaterally with normal respiratory effort. CV: Regular rate and rhythm, normal S1/S2, no S3/S4, no murmur. No pretibial or periankle edema.  No carotid bruit.   Abdomen: Soft, nontender, no distention.  Neurologic: Alert and oriented.  Psych: Normal affect. Skin: Normal. Musculoskeletal: Bilateral knee swelling, right>left.    ECG: Most recent ECG reviewed.   Labs: Lab Results  Component Value Date/Time   K 4.1 03/11/2017 09:46 AM   BUN 12 03/11/2017 09:46 AM   CREATININE 1.30 (H) 03/11/2017 09:46 AM   CREATININE  1.44 (H) 01/12/2013 08:33 AM   ALT 20 05/29/2013 06:42 PM   TSH 1.499 01/22/2011 02:45 PM   HGB 11.9 (L) 03/11/2017 09:46 AM     Lipids: Lab Results  Component Value Date/Time   LDLCALC 64 05/28/2013 09:35 AM   CHOL 117 05/28/2013 09:35 AM   TRIG 82 05/28/2013 09:35 AM   HDL 37 (L) 05/28/2013 09:35 AM       ASSESSMENT AND PLAN: 1.  Preoperative risk stratification: He has a 0.7% probability of perioperative myocardial infarction or cardiac arrest as per the  geriatric sensitive perioperative cardiac risk index.  As per the revised cardiac risk index, he has a 6.6% estimated rate of myocardial infarction, pulmonary edema, ventricular fibrillation, cardiac arrest, or complete heart block.  Overall, he is at a moderate risk for a major adverse cardiac event in the perioperative period. He is on good medical therapy with aspirin, simvastatin, and metoprolol succinate.  I think he can proceed with right total knee arthroplasty with an acceptable level of risk.  I explained all of this in detail to the patient.  2. CAD with prior MI and exertional dyspnea:  He essentially has CCS class II angina pectoris.  Nuclear stress test results reviewed above. Currently on ASA, metoprolol, and simvastatin.  No changes to therapy.  3. Hypertension:  Controlled on present therapy.  He has not been on a clonidine patch for some time.  I will discontinue this.  4. Hyperlipidemia: Continue simvastatin.     Disposition: Follow up April 2019  Time spent: 40 minutes, of which greater than 50% was spent reviewing symptoms, relevant blood tests and studies, and discussing management plan with the patient.    Prentice Docker, M.D., F.A.C.C.

## 2017-03-15 NOTE — Patient Instructions (Signed)
Your physician recommends that you schedule a follow-up appointment in: April 2019 with Dr.Koneswaran    Your physician recommends that you continue on your current medications as directed. Please refer to the Current Medication list given to you today.    If you need a refill on your cardiac medications before your next appointment, please call your pharmacy.     No lab work or tests ordered today.       Thank you for choosing [facility] !

## 2017-03-22 MED ORDER — TRANEXAMIC ACID 1000 MG/10ML IV SOLN
1000.0000 mg | INTRAVENOUS | Status: DC
  Filled 2017-03-22: qty 10

## 2017-03-22 MED ORDER — CEFAZOLIN SODIUM-DEXTROSE 2-4 GM/100ML-% IV SOLN: 2.0000 g | INTRAVENOUS | Status: DC

## 2017-03-22 MED ORDER — SODIUM CHLORIDE 0.9 % IV SOLN: INTRAVENOUS | Status: DC

## 2017-03-22 MED ORDER — ACETAMINOPHEN 10 MG/ML IV SOLN: 1000.0000 mg | INTRAVENOUS | Status: DC

## 2017-03-25 ENCOUNTER — Inpatient Hospital Stay (HOSPITAL_COMMUNITY): Admission: RE | Admit: 2017-03-25 | Payer: Medicare HMO | Source: Ambulatory Visit | Admitting: Orthopedic Surgery

## 2017-03-25 ENCOUNTER — Encounter (HOSPITAL_COMMUNITY): Admission: RE | Payer: Self-pay | Source: Ambulatory Visit

## 2017-03-25 SURGERY — ARTHROPLASTY, KNEE, TOTAL, USING IMAGELESS COMPUTER-ASSISTED NAVIGATION
Anesthesia: Spinal | Site: Knee | Laterality: Right

## 2017-05-29 ENCOUNTER — Ambulatory Visit (HOSPITAL_COMMUNITY)
Admission: RE | Admit: 2017-05-29 | Discharge: 2017-05-29 | Disposition: A | Discharge status: Home / Self Care | Payer: Medicare HMO | Source: Ambulatory Visit | Attending: Family Medicine | Admitting: Family Medicine

## 2017-05-29 ENCOUNTER — Other Ambulatory Visit (HOSPITAL_COMMUNITY): Payer: Self-pay | Admitting: Family Medicine

## 2017-05-29 DIAGNOSIS — M47816 Spondylosis without myelopathy or radiculopathy, lumbar region: Secondary | ICD-10-CM | POA: Insufficient documentation

## 2017-05-29 DIAGNOSIS — M1990 Unspecified osteoarthritis, unspecified site: Secondary | ICD-10-CM

## 2017-06-24 ENCOUNTER — Telehealth (HOSPITAL_COMMUNITY): Payer: Self-pay | Admitting: Family Medicine

## 2017-06-24 NOTE — Telephone Encounter (Signed)
06/24/17  pt called back to cx said he just couldn't afford teh $40 copay each time

## 2017-06-27 ENCOUNTER — Encounter (HOSPITAL_COMMUNITY): Payer: Self-pay

## 2017-06-27 ENCOUNTER — Ambulatory Visit (HOSPITAL_COMMUNITY): Payer: Medicare HMO

## 2017-10-28 ENCOUNTER — Encounter (HOSPITAL_COMMUNITY): Payer: Self-pay

## 2017-10-28 ENCOUNTER — Other Ambulatory Visit: Payer: Self-pay

## 2017-10-28 ENCOUNTER — Ambulatory Visit (HOSPITAL_COMMUNITY): Payer: Medicare HMO | Attending: Family Medicine

## 2017-10-28 DIAGNOSIS — M25561 Pain in right knee: Secondary | ICD-10-CM | POA: Diagnosis present

## 2017-10-28 DIAGNOSIS — M25562 Pain in left knee: Secondary | ICD-10-CM | POA: Diagnosis not present

## 2017-10-28 DIAGNOSIS — R2689 Other abnormalities of gait and mobility: Secondary | ICD-10-CM | POA: Diagnosis present

## 2017-10-28 DIAGNOSIS — G8929 Other chronic pain: Secondary | ICD-10-CM | POA: Diagnosis present

## 2017-10-28 NOTE — Therapy (Signed)
[facility] [address] [place], [place], [address] Phone: [phone]   Fax:  [phone]  Physical Therapy Evaluation  Patient Details  Name: Glenn Brooks MRN: [id number] Date of Birth: 1930-01-13 Referring Provider: Oval Linsey, MD   Encounter Date: 10/28/2017    10/28/17 1752  PT Visits / Re-Eval  Visit Number 1  Number of Visits 17  Date for PT Re-Evaluation 12/23/17  Authorization  Authorization Type [employer] HMO (no auth required, no visit limit)  Authorization Time Period 10/28/17 - 12/27/17  Authorization - Visit Number 1  Authorization - Number of Visits 10  PT Time Calculation  PT Start Time 0903  PT Stop Time 0948  PT Time Calculation (min) 45 min  PT - End of Session  Activity Tolerance Patient tolerated treatment well  Behavior During Therapy [facility] for tasks assessed/performed     Past Medical History:  Diagnosis Date  . Arteriosclerotic cardiovascular disease (ASCVD) 1993   Acute MI->PCI in 1993; asymptomatic PVCs  . ASCVD (arteriosclerotic cardiovascular disease) 1993   Status post MI and percutaneous intervention  . Colonic polyp    excised via colonoscope  . Coronary artery disease   . Degenerative joint disease    shoulder  . ED (erectile dysfunction)    Secondary to medication  . Headache   . Hyperglycemia   . Hyperlipidemia   . Hypertension   . Myocardial infarction (HCC)   . Obesity   . Stroke [facility])    "mini stroke, Bell's palsy"   . Testosterone deficiency    Erectile dysfunction    Past Surgical History:  Procedure Laterality Date  . CATARACT EXTRACTION, BILATERAL    . COLONOSCOPY W/ POLYPECTOMY  04/12/2010   [id number] rectum/Pancolonic diverticula/Polyp at hepatic flexure and cecum, resected with snare technique as above  . KNEE ARTHROSCOPY     Arthroscopic surgery x 2  . SHOULDER SURGERY    . TRANSURETHRAL RESECTION OF BLADDER      There were no vitals filed for this  visit.   Subjective Assessment - 10/28/17 0911    Symptoms/Limitations  Subjective Patient reports that his knee pain worsened about 8-9 months ago and that he was able to straighten his knees prior to that. He reports his knee pain prevents him from walking even short household distances but he manages with his bil axillary crutches. He reports 2 Rt knee scopes in the last 10-15 years and that he was considered for TKA?Ts at one time but was then told he could not undergo surgery due to his cardiac history. He states he has been turned down 2x in the last year. He would like to be able to walk easier and to have decreased pain. He has not used a RW, and when asked why he states he doesn?Tt know, he doesn?Tt want to.  Pertinent History patient has difficulty providing history secondary to attention to questions  Limitations Standing;Walking;House hold activities  How long can you sit comfortably? not limited  How long can you stand comfortably? very limited  How long can you walk comfortably? very limited (household distances are difficult)  Patient Stated Goals to have less knee pain and walk better  Pain Assessment  Currently in Pain? Yes  Pain Score 5  Pain Location Knee  Pain Orientation Right;Left  Pain Descriptors / Indicators Aching;Sore  Pain Type Chronic pain  Pain Onset More than a month ago  Pain Frequency Constant  Aggravating Factors  walking, standing  Pain  Relieving Factors rest, sitting or laying down  Effect of Pain on Daily Activities severe limitation with mobility     [facility] Assessment 10/28/17 0001    Medical Diagnosis Bil Knee Pain  Referring Provider Oval Linsey, MD  Onset Date/Surgical Date  (8-9 months ago)  Next MD Visit unsure  Prior Therapy no  Precautions  Precautions Fall  Restrictions  Weight Bearing Restrictions No  Balance Screen  Has the patient fallen in the past 6 months Yes  How many times? 5 or more  Has the patient had a decrease  in activity level because of a fear of falling?  Yes  Is the patient reluctant to leave their home because of a fear of falling?  Yes  Home Environment  Living Environment Private residence  Living Arrangements Spouse/significant other;Children  Available Help at Discharge  (wife is unable to physically assist)  Home Access Stairs to enter  Entrance Stairs-Number of Steps 5  Entrance Stairs-Rails Right  Home Layout One level  Home Equipment Crutches;Cane - single point (tub shower combo)  Additional Comments Patient states his wife ?ohas sugar? and has health problems related to that (patient elaborates on DM as what he is referring to) so she is unable to help him, he is still driving  Prior Function  Level of Independence Requires assistive device for independence;Independent with household mobility with device  Cognition  Overall Cognitive Status Difficult to assess  Difficult to assess due to  (Decreased attention to questions; possibly hard of hearing)  Observation/Other Assessments  Focus on Therapeutic Outcomes (FOTO)   (unable to locate patient in system, perform next visit)  ROM / Strength  AROM / PROM / Strength AROM;PROM;Strength  AROM  AROM Assessment Site Knee  Right/Left Knee Right;Left  Right Knee Extension 32 (lacking)  Right Knee Flexion 108  Left Knee Extension 19 (lacking)  Left Knee Flexion 106  PROM  PROM Assessment Site Knee  Right/Left Knee Right;Left  Right Knee Flexion 110  Left Knee Flexion 112  Strength  Overall Strength Comments patient unable to achieve some test positions at evaluation  Strength Assessment Site Hip;Knee;Ankle  Right/Left Knee Right;Left  Right Knee Flexion 3-/5 (tested in sitting (4/5))  Right Knee Extension 3+/5  Left Knee Flexion 3-/5 (tested in sitting (4/5))  Left Knee Extension 3+/5  Right Hip Flexion 2+/5  Right Hip ABduction 2+/5 (tested in supine)  Left Hip Flexion 2+/5  Left Hip ABduction 2+/5 (tested in  supine)  Right Ankle Dorsiflexion 3+/5  Left Ankle Dorsiflexion 3+/5  Flexibility  Soft Tissue Assessment /Muscle Length y  Hamstrings extreme joint limitations at bil knee joints and hmastring restrictions  Palpation  Palpation comment tenderness arounf patella on bil anterior knee's  Ambulation/Gait  Ambulation/Gait Yes  Ambulation/Gait Assistance 5: Supervision;4: Min guard  Ambulation/Gait Assistance Details cues for safety with step pattern  Ambulation Distance (Feet) 40 Feet (into/out of evaluation room)  Assistive device R Axillary Crutch;L Axillary Crutch  Gait Pattern Step-to pattern;Decreased step length - left;Decreased step length - right;Decreased stride length;Right flexed knee in stance;Left flexed knee in stance;Shuffle;Trunk flexed;Poor foot clearance - left;Poor foot clearance - right  Ambulation Surface Level;Indoor  Gait Comments patient is extremely unsteady with gait and ambulates primarily with bil axillary crutches      Objective measurements completed on examination: See above findings.       10/28/17 0001  Ambulation/Gait  Ambulation/Gait Yes  Ambulation/Gait Assistance 5: Supervision;4: Min guard  Ambulation/Gait Assistance Details cues for safety with  step pattern  Ambulation Distance (Feet) 40 Feet (into/out of evaluation room)  Assistive device R Axillary Crutch;L Axillary Crutch  Gait Pattern Step-to pattern;Decreased step length - left;Decreased step length - right;Decreased stride length;Right flexed knee in stance;Left flexed knee in stance;Shuffle;Trunk flexed;Poor foot clearance - left;Poor foot clearance - right  Ambulation Surface Level;Indoor  Gait Comments patient is extremely unsteady with gait and ambulates primarily with bil axillary crutches  Exercises  Exercises Knee/Hip  Knee/Hip Exercises: Stretches  Active Hamstring Stretch 2 reps;Both;30 seconds  Active Hamstring Stretch Limitations seated in chair      PT Education -  10/28/17 1752    Education Details  Educated on exam findings and impression as well as rehab potential. Initiated HEP.    Person(s) Educated  Patient    Methods  Explanation;Handout    Comprehension  Verbalized understanding       PT Short Term Goals - 10/28/17 1754      PT SHORT TERM GOAL #1   Title  Patient will be independent with HEP, updated PRN, to improve/progress funcitonal ROM and strength to improve mobility.    Time  2    Period  Weeks    Target Date  11/11/17      PT SHORT TERM GOAL #2   Title  Patient will improve ROM for bil tibiofemoral joints by 8 degrees to demonstrate significant improvement in ROM to improve performance with gait and functional mobility.    Time  4    Period  Weeks    Status  New    Target Date  11/25/17      PT SHORT TERM GOAL #3   Title  Patient will ambulate 100 feet with LRAD and safe gait pattern to demonstrate improve endurance and improved home/community access.    Time  4    Period  Weeks    Status  New        PT Long Term Goals - 10/28/17 1754      PT LONG TERM GOAL #1   Title  Patient will improve ROM for bil tibiofemoral joints by 15 degrees to demonstrate significant improvement in ROM to improve performance with gait and functional mobility.    Time  8    Period  Weeks    Status  New    Target Date  12/23/17      PT LONG TERM GOAL #2   Title  Patient will improve MMT by 1 grade for limited groups to demonstrate improve functional LE strength to improve performance with functional gait and mobility.    Time  8    Period  Weeks    Status  New      PT LONG TERM GOAL #3   Title  Patient will ambulate 150 feet at 0.8 m/s to demonstrate improved home and community access and improve independence with mobiltiy.     Time  8    Period  Weeks    Status  New      PT LONG TERM GOAL #4   Title  Patient will ascend/descend 4x 6 inch stairs with modified independence to improve safety with home access as he has stairs to enter.     Time  8    Period  Weeks    Status  New         10/28/17 1753  Plan  Clinical Impression Statement Mr. Biese presents to outpatient physical therapy evaluation for bil knee pain and difficulty walking. He has severe  ROM limitations for both flexion and extension and has a hard end feel with PROM and overpressure at bil tibiofemoral joints. He has severe limitations of muscle length in bil LE, decreased strength, impaired balance, impaired gait, and decreased activity tolerance. He reports his knees have been progressively worsening in the last 8-9 months and that he has been turned down from joint replacement surgery twice, therefore he is exploring conservative treatment with therapy. He will benefit from skilled PT interventions to address impairments and progress independence with functional mobility.   History and Personal Factors relevant to plan of care: unclear history from patient (history of MI's, CAD, TIA, knee scope on Rt, and shoulder surgery per chart review)  Clinical Presentation Stable  Clinical Presentation due to: MMT, ROM, gait, clinical judgement, weakness, impaired balance, history of falling, decreased ROM  Clinical Decision Making Moderate  Pt will benefit from skilled therapeutic intervention in order to improve on the following deficits Abnormal gait;Decreased balance;Decreased endurance;Decreased mobility;Difficulty walking;Hypomobility;Increased muscle spasms;Improper body mechanics;Decreased range of motion;Decreased activity tolerance;Decreased strength;Decreased knowledge of use of DME;Increased fascial restricitons;Impaired flexibility;Pain  Rehab Potential Fair  Clinical Impairments Affecting Rehab Potential (-) financial restrictions  PT Frequency 2x / week  PT Duration 8 weeks  PT Treatment/Interventions ADLs/Self Care Home Management;Aquatic Therapy;Cryotherapy;Electrical Stimulation;Moist Heat;DME Instruction;Gait training;Stair training;Functional mobility  training;Therapeutic activities;Therapeutic exercise;Balance training;Neuromuscular re-education;Patient/family education;Manual techniques;Passive range of motion;Taping  PT Next Visit Plan Review Eval and goals. Review HEP and initiate manual therapy for joint mobilization and soft tissue work for muscle length restrictions. Progress exercises to quad set, provide LLLD stretch.   PT Home Exercise Plan Eval: seated hamstring stretch  Consulted and Agree with Plan of Care Patient    Patient will benefit from skilled therapeutic intervention in order to improve the following deficits and impairments:  Abnormal gait, Decreased balance, Decreased endurance, Decreased mobility, Difficulty walking, Hypomobility, Increased muscle spasms, Improper body mechanics, Decreased range of motion, Decreased activity tolerance, Decreased strength, Decreased knowledge of use of DME, Increased fascial restricitons, Impaired flexibility, Pain  Visit Diagnosis: Chronic pain of left knee  Chronic pain of right knee  Other abnormalities of gait and mobility     Problem List Patient Active Problem List   Diagnosis Date Noted  . Primary osteoarthritis of both knees 01/07/2014  . Odynophagia 06/08/2013  . GERD (gastroesophageal reflux disease) 06/08/2013  . HYPERLIPIDEMIA 02/10/2010  . HYPERTENSION 03/24/2009  . ATHEROSCLEROTIC CARDIOVASCULAR DISEASE 03/24/2009  . DEGENERATIVE JOINT DISEASE 03/24/2009  . HYPERGLYCEMIA 03/24/2009    Glenn Brooks, PT, DPT Physical Therapist with [facility]  10/28/2017 5:55 PM    [facility] [address] [place], [place], [address] Phone: [phone]   Fax:  [phone]  Name: Glenn Brooks MRN: [id number] Date of Birth: 08-11-1929

## 2017-10-30 ENCOUNTER — Other Ambulatory Visit: Payer: Self-pay

## 2017-10-30 ENCOUNTER — Ambulatory Visit (HOSPITAL_COMMUNITY): Payer: Medicare HMO

## 2017-10-30 ENCOUNTER — Encounter (HOSPITAL_COMMUNITY): Payer: Self-pay

## 2017-10-30 DIAGNOSIS — M25561 Pain in right knee: Secondary | ICD-10-CM

## 2017-10-30 DIAGNOSIS — M25562 Pain in left knee: Principal | ICD-10-CM

## 2017-10-30 DIAGNOSIS — G8929 Other chronic pain: Secondary | ICD-10-CM

## 2017-10-30 DIAGNOSIS — R2689 Other abnormalities of gait and mobility: Secondary | ICD-10-CM

## 2017-10-30 NOTE — Patient Instructions (Signed)
  Exercises  Supine Quad Set - 10-15 reps - 3 sets - 1x daily - 7x weekly  Supine Heel Slides - 10-15 reps - 3 sets - 1x daily - 7x weekly

## 2017-10-30 NOTE — Therapy (Signed)
[facility] [address] [place], [place], [address] Phone: [phone]   Fax:  [phone]  Physical Therapy Treatment  Patient Details  Name: Glenn Brooks MRN: [id number] Date of Birth: Jan 06, 1930 Referring Provider: Oval Linseyondiego, Richard, MD   Encounter Date: 10/30/2017  PT End of Session - 10/30/17 0904    Visit Number  2    Number of Visits  17    Date for PT Re-Evaluation  12/23/17    Authorization Type  [employer] Medicare HMO (no auth required, no visit limit)    Authorization Time Period  10/28/17 - 12/27/17    Authorization - Visit Number  2    Authorization - Number of Visits  10    PT Start Time  0818    PT Stop Time  0905    PT Time Calculation (min)  47 min    Activity Tolerance  Patient tolerated treatment well    Behavior During Therapy  [facility] assessed/performed       Past Medical History:  Diagnosis Date  . Arteriosclerotic cardiovascular disease (ASCVD) 1993   Acute MI->PCI in 1993; asymptomatic PVCs  . ASCVD (arteriosclerotic cardiovascular disease) 1993   Status post MI and percutaneous intervention  . Colonic polyp    excised via colonoscope  . Coronary artery disease   . Degenerative joint disease    shoulder  . ED (erectile dysfunction)    Secondary to medication  . Headache   . Hyperglycemia   . Hyperlipidemia   . Hypertension   . Myocardial infarction (HCC)   . Obesity   . Stroke [facility](HCC)    "mini stroke, Bell's palsy"   . Testosterone deficiency    Erectile dysfunction    Past Surgical History:  Procedure Laterality Date  . CATARACT EXTRACTION, BILATERAL    . COLONOSCOPY W/ POLYPECTOMY  04/12/2010   [id number]:Normal rectum/Pancolonic diverticula/Polyp at hepatic flexure and cecum, resected with snare technique as above  . KNEE ARTHROSCOPY     Arthroscopic surgery x 2  . SHOULDER SURGERY    . TRANSURETHRAL RESECTION OF BLADDER      There were no vitals filed for this visit.  Subjective  Assessment - 10/30/17 0828    Subjective  Patient states he tried his strethces at home and that they were nto difficult. He states the stretch was a little painful but he alwasy has pain so it did not feel worse than normal. He reprots about 5/10 pain with walking and no pain with sitting today.    Pertinent History  patient has difficulty providing history secondary to attention to questions    Limitations  Standing;Walking;House hold activities    How long can you sit comfortably?  not limited    How long can you stand comfortably?  very limited    How long can you walk comfortably?  very limited (household distances are difficult)    Patient Stated Goals  to have less knee pain and walk better    Currently in Pain?  Yes    Pain Score  5     Pain Location  Knee    Pain Orientation  Left;Right    Pain Descriptors / Indicators  Aching;Sore    Pain Type  Chronic pain    Pain Onset  More than a month ago    Pain Frequency  Constant    Aggravating Factors   walking    Pain Relieving Factors  sitting    Effect of Pain  on Daily Activities  severe        [facility] Adult PT Treatment/Exercise - 10/30/17 0001      Ambulation/Gait   Ambulation/Gait  Yes    Ambulation/Gait Assistance  5: Supervision    Ambulation Distance (Feet)  110 Feet    Assistive device  R Axillary Crutch;L Axillary Crutch    Gait Pattern  Step-to pattern;Decreased step length - left;Decreased step length - right;Decreased stride length;Right flexed knee in stance;Left flexed knee in stance;Shuffle;Trunk flexed;Poor foot clearance - left;Poor foot clearance - right    Ambulation Surface  Level;Indoor    Gait velocity  0.27 m/s    Stairs  Yes    Stairs Assistance  5: Supervision    Stair Management Technique  One rail Left;With crutches;Forwards;Step to pattern    Number of Stairs  4 6 to descned    Height of Stairs  6 4 to descend      Knee/Hip Exercises: Supine   Quad Sets  Both;1 set;10 reps    Heel Slides   AROM;Both;1 set;10 reps;Limitations    Heel Slides Limitations  verbal cue to straighten and bend knee until he feels a stretch      Manual Therapy   Manual Therapy  Joint mobilization;Passive ROM    Manual therapy comments  manual copmpleted seperate fom other interventions    Joint Mobilization  3x 30-45 seconds grade III oscillations for AP glide to bil tibiofemoral joint to improve flexion; 3x 30-45 seconds grade III oscillations for PA glide to bil tibiofemoral joint to improve extension    Passive ROM  3x 10 reps (between joint mobilization) bil tibfem joints         PT Education - 10/30/17 1026    Education Details  Reviewed evaluation and goals. Educated on updated HEP.    Person(s) Educated  Patient    Methods  Explanation;Handout    Comprehension  Verbalized understanding       PT Short Term Goals - 10/30/17 1031      PT SHORT TERM GOAL #1   Title  Patient will be independent with HEP, updated PRN, to improve/progress funcitonal ROM and strength to improve mobility.    Time  2    Period  Weeks    Status  On-going      PT SHORT TERM GOAL #2   Title  Patient will improve ROM for bil tibiofemoral joints by 8 degrees to demonstrate significant improvement in ROM to improve performance with gait and functional mobility.    Time  4    Period  Weeks    Status  On-going      PT SHORT TERM GOAL #3   Title  Patient will ambulate 100 feet with LRAD and safe gait pattern to demonstrate improve endurance and improved home/community access.    Time  4    Period  Weeks    Status  On-going        PT Long Term Goals - 10/30/17 1031      PT LONG TERM GOAL #1   Title  Patient will improve ROM for bil tibiofemoral joints by 15 degrees to demonstrate significant improvement in ROM to improve performance with gait and functional mobility.    Time  8    Period  Weeks    Status  On-going      PT LONG TERM GOAL #2   Title  Patient will improve MMT by 1 grade for limited groups  to demonstrate improve  functional LE strength to improve performance with functional gait and mobility.    Time  8    Period  Weeks    Status  On-going      PT LONG TERM GOAL #3   Title  Patient will ambulate 150 feet at 0.8 m/s to demonstrate improved home and community access and improve independence with mobiltiy.     Time  8    Period  Weeks    Status  On-going      PT LONG TERM GOAL #4   Title  Patient will ascend/descend 4x 6 inch stairs with modified independence to improve safety with home access as he has stairs to enter.    Time  8    Period  Weeks    Status  On-going        Plan - 10/30/17 1027    Clinical Impression Statement  Initiated session by reviewing eval, goals, and compliance with HEP. Patient was able to perform this session and ambulated 110 feet during test. He also demonstrated ability to ascend/descend stairs with use of crutches but required min guard for safety. Manual interventions were initiated this session to address joint restrictions and suipne exercises for ROM were added to HEP. Patient had good tolerance to joint mobilizations with no increase in pain and demonstrated proper form wtih exercises. He has greater difficulty achieving quad activation on Rt LE due to ROm limitations. He will continue to benefit from skilled PT interventions to address impairments and progress mobility.    Rehab Potential  Fair    Clinical Impairments Affecting Rehab Potential  (-) financial restrictions    PT Frequency  2x / week    PT Duration  8 weeks    PT Treatment/Interventions  ADLs/Self Care Home Management;Aquatic Therapy;Cryotherapy;Electrical Stimulation;Moist Heat;DME Instruction;Gait training;Stair training;Functional mobility training;Therapeutic activities;Therapeutic exercise;Balance training;Neuromuscular re-education;Patient/family education;Manual techniques;Passive range of motion;Taping    PT Next Visit Plan  Continue manual interventison to bil knee  joints and add LLLD stretch in prone for HEP. Contineu with supien strengthening and add sit to stands.    PT Home Exercise Plan  Eval: seated hamstring stretch    Consulted and Agree with Plan of Care  Patient       Patient will benefit from skilled therapeutic intervention in order to improve the following deficits and impairments:  Abnormal gait, Decreased balance, Decreased endurance, Decreased mobility, Difficulty walking, Hypomobility, Increased muscle spasms, Improper body mechanics, Decreased range of motion, Decreased activity tolerance, Decreased strength, Decreased knowledge of use of DME, Increased fascial restricitons, Impaired flexibility, Pain  Visit Diagnosis: Chronic pain of left knee  Chronic pain of right knee  Other abnormalities of gait and mobility     Problem List Patient Active Problem List   Diagnosis Date Noted  . Primary osteoarthritis of both knees 01/07/2014  . Odynophagia 06/08/2013  . GERD (gastroesophageal reflux disease) 06/08/2013  . HYPERLIPIDEMIA 02/10/2010  . HYPERTENSION 03/24/2009  . ATHEROSCLEROTIC CARDIOVASCULAR DISEASE 03/24/2009  . DEGENERATIVE JOINT DISEASE 03/24/2009  . HYPERGLYCEMIA 03/24/2009    Valentino Saxon, PT, DPT Physical Therapist with [facility]  10/30/2017 10:31 AM    [facility] [address] [place], [place], [address] Phone: [phone]   Fax:  [phone]  Name: Glenn Brooks MRN: [id number] Date of Birth: [birth year]

## 2017-11-05 ENCOUNTER — Encounter (HOSPITAL_COMMUNITY): Payer: Self-pay

## 2017-11-05 ENCOUNTER — Ambulatory Visit (HOSPITAL_COMMUNITY): Payer: Medicare HMO

## 2017-11-05 DIAGNOSIS — M25562 Pain in left knee: Principal | ICD-10-CM

## 2017-11-05 DIAGNOSIS — M25561 Pain in right knee: Secondary | ICD-10-CM

## 2017-11-05 DIAGNOSIS — R2689 Other abnormalities of gait and mobility: Secondary | ICD-10-CM

## 2017-11-05 DIAGNOSIS — G8929 Other chronic pain: Secondary | ICD-10-CM

## 2017-11-05 NOTE — Therapy (Signed)
[place] [facility] [address] [place], [place], [address] Phone: [phone]   Fax:  [phone]  Physical Therapy Treatment  Patient Details  Name: Glenn EstimableClyde M Poynor MRN: [id number] Date of Birth: December 30, 1929 Referring Provider: Oval Linseyondiego, Richard, MD   Encounter Date: 11/05/2017  PT End of Session - 11/05/17 0825    Visit Number  3    Number of Visits  17    Date for PT Re-Evaluation  12/23/17    Authorization Type  [employer] Medicare HMO (no auth required, no visit limit)    Authorization Time Period  10/28/17 - 12/27/17    Authorization - Visit Number  3    Authorization - Number of Visits  10    PT Start Time  0819    PT Stop Time  0859    PT Time Calculation (min)  40 min    Activity Tolerance  Patient tolerated treatment well    Behavior During Therapy  [facility] for tasks assessed/performed       Past Medical History:  Diagnosis Date  . Arteriosclerotic cardiovascular disease (ASCVD) 1993   Acute MI->PCI in 1993; asymptomatic PVCs  . ASCVD (arteriosclerotic cardiovascular disease) 1993   Status post MI and percutaneous intervention  . Colonic polyp    excised via colonoscope  . Coronary artery disease   . Degenerative joint disease    shoulder  . ED (erectile dysfunction)    Secondary to medication  . Headache   . Hyperglycemia   . Hyperlipidemia   . Hypertension   . Myocardial infarction (HCC)   . Obesity   . Stroke [facility](HCC)    "mini stroke, Bell's palsy"   . Testosterone deficiency    Erectile dysfunction    Past Surgical History:  Procedure Laterality Date  . CATARACT EXTRACTION, BILATERAL    . COLONOSCOPY W/ POLYPECTOMY  04/12/2010   ONG:[id number]:Normal rectum/Pancolonic diverticula/Polyp at hepatic flexure and cecum, resected with snare technique as above  . KNEE ARTHROSCOPY     Arthroscopic surgery x 2  . SHOULDER SURGERY    . TRANSURETHRAL RESECTION OF BLADDER      There were no vitals filed for this visit.  Subjective  Assessment - 11/05/17 0822    Subjective  Pt reports that it's hard for him to move in the mornings. His current knee pain is 5/10 bil.    Pertinent History  patient has difficulty providing history secondary to attention to questions    Limitations  Standing;Walking;House hold activities    How long can you sit comfortably?  not limited    How long can you stand comfortably?  very limited    How long can you walk comfortably?  very limited (household distances are difficult)    Patient Stated Goals  to have less knee pain and walk better    Currently in Pain?  Yes    Pain Score  5     Pain Location  Knee    Pain Orientation  Right;Left    Pain Descriptors / Indicators  Aching;Sore    Pain Type  Chronic pain    Pain Onset  More than a month ago    Pain Frequency  Constant    Aggravating Factors   walking    Pain Relieving Factors  sitting    Effect of Pain on Daily Activities  severe           [facility] Adult PT Treatment/Exercise - 11/05/17 0001      Exercises  Exercises  Knee/Hip      Knee/Hip Exercises: Stretches   Active Hamstring Stretch  Both    Active Hamstring Stretch Limitations  seated, 10x10" holds during towel slide    Knee: Self-Stretch to increase Flexion  Both    Knee: Self-Stretch Limitations  seated, 10x10" during towel slide      Knee/Hip Exercises: Seated   Long Arc Quad  Both;10 reps    Long Arc Quad Limitations  3-5" holds    Sit to [employer]Sand  10 reps;without UE support   22" mat height     Knee/Hip Exercises: Supine   Quad Sets  Both;1 set;10 reps    [employer] Tim[employer] CompanyQuad Sets Limitations  3-5" holds    Short Arc [employer] Tim[employer] CompanyQuad Sets  Both;10 reps    Short Arc Quad Sets Limitations  --    Heel Slides  AROM;Both;1 set;5 reps    Heel Slides Limitations  5" holds, min A to help get RLE started      Manual Therapy   Manual Therapy  Joint mobilization    Manual therapy comments  manual copmpleted seperate fom other interventions    Joint Mobilization  3x 30-45 seconds grade III  oscillations for AP glide to bil tibiofemoral joint to improve flexion; 3x 30-45 seconds grade III oscillations for PA glide to bil tibiofemoral joint to improve extension; bil patellar mobs all directions x2 mins each knee             PT Education - 11/05/17 0825    Education Details  exercise technique, continue HEP    Person(s) Educated  Patient    Methods  Explanation;Demonstration    Comprehension  Verbalized understanding;Returned demonstration       PT Short Term Goals - 10/30/17 1031      PT SHORT TERM GOAL #1   Title  Patient will be independent with HEP, updated PRN, to improve/progress funcitonal ROM and strength to improve mobility.    Time  2    Period  Weeks    Status  On-going      PT SHORT TERM GOAL #2   Title  Patient will improve ROM for bil tibiofemoral joints by 8 degrees to demonstrate significant improvement in ROM to improve performance with gait and functional mobility.    Time  4    Period  Weeks    Status  On-going      PT SHORT TERM GOAL #3   Title  Patient will ambulate 100 feet with LRAD and safe gait pattern to demonstrate improve endurance and improved home/community access.    Time  4    Period  Weeks    Status  On-going        PT Long Term Goals - 10/30/17 1031      PT LONG TERM GOAL #1   Title  Patient will improve ROM for bil tibiofemoral joints by 15 degrees to demonstrate significant improvement in ROM to improve performance with gait and functional mobility.    Time  8    Period  Weeks    Status  On-going      PT LONG TERM GOAL #2   Title  Patient will improve MMT by 1 grade for limited groups to demonstrate improve functional LE strength to improve performance with functional gait and mobility.    Time  8    Period  Weeks    Status  On-going      PT LONG TERM GOAL #3   Title  Patient  will ambulate 150 feet at 0.8 m/s to demonstrate improved home and community access and improve independence with mobiltiy.     Time  8     Period  Weeks    Status  On-going      PT LONG TERM GOAL #4   Title  Patient will ascend/descend 4x 6 inch stairs with modified independence to improve safety with home access as he has stairs to enter.    Time  8    Period  Weeks    Status  On-going            Plan - 11/05/17 8657    Clinical Impression Statement  Continued with established POC focusing on knee ROM and overall pain control. Added LAQ and SAQ to therex this date to continue to improve quad activation; pt had the most difficulty during SAQ with RLE due to quad weakness and ROM deficits. Also added STS from elevated mat height Ended with manual to address joint hypomobility. Pt tolerating session well, not reporting any increased pain at EOS.     Rehab Potential  Fair    Clinical Impairments Affecting Rehab Potential  (-) financial restrictions    PT Frequency  2x / week    PT Duration  8 weeks    PT Treatment/Interventions  ADLs/Self Care Home Management;Aquatic Therapy;Cryotherapy;Electrical Stimulation;Moist Heat;DME Instruction;Gait training;Stair training;Functional mobility training;Therapeutic activities;Therapeutic exercise;Balance training;Neuromuscular re-education;Patient/family education;Manual techniques;Passive range of motion;Taping    PT Next Visit Plan  Continue manual interventison to bil knee joints and add LLLD stretch in prone for HEP. Contineu with supien strengthening, sit to stands, SAQ    PT Home Exercise Plan  Eval: seated hamstring stretch    Consulted and Agree with Plan of Care  Patient       Patient will benefit from skilled therapeutic intervention in order to improve the following deficits and impairments:  Abnormal gait, Decreased balance, Decreased endurance, Decreased mobility, Difficulty walking, Hypomobility, Increased muscle spasms, Improper body mechanics, Decreased range of motion, Decreased activity tolerance, Decreased strength, Decreased knowledge of use of DME, Increased fascial  restricitons, Impaired flexibility, Pain  Visit Diagnosis: Chronic pain of left knee  Chronic pain of right knee  Other abnormalities of gait and mobility     Problem List Patient Active Problem List   Diagnosis Date Noted  . Primary osteoarthritis of both knees 01/07/2014  . Odynophagia 06/08/2013  . GERD (gastroesophageal reflux disease) 06/08/2013  . HYPERLIPIDEMIA 02/10/2010  . HYPERTENSION 03/24/2009  . ATHEROSCLEROTIC CARDIOVASCULAR DISEASE 03/24/2009  . DEGENERATIVE JOINT DISEASE 03/24/2009  . HYPERGLYCEMIA 03/24/2009       Jac Canavan PT, DPT  [place] [facility] [address] [place], [place], [address] Phone: [phone]   Fax:  [phone]  Name: ELLISON RIETH MRN: [id number] Date of Birth: 04-21-1929

## 2017-11-07 ENCOUNTER — Ambulatory Visit (HOSPITAL_COMMUNITY): Payer: Medicare HMO

## 2017-11-07 DIAGNOSIS — M25562 Pain in left knee: Secondary | ICD-10-CM | POA: Diagnosis not present

## 2017-11-07 DIAGNOSIS — M25561 Pain in right knee: Secondary | ICD-10-CM

## 2017-11-07 DIAGNOSIS — G8929 Other chronic pain: Secondary | ICD-10-CM

## 2017-11-07 DIAGNOSIS — R2689 Other abnormalities of gait and mobility: Secondary | ICD-10-CM

## 2017-11-07 NOTE — Therapy (Signed)
[place] [facility] [address] [place], [place], [address] Phone: [phone]   Fax:  [phone]  Physical Therapy Treatment  Patient Details  Name: Glenn Brooks MRN: [id number] Date of Birth: 11/21/1929 Referring Provider: Oval Linseyondiego, Richard, MD   Encounter Date: 11/07/2017  PT End of Session - 11/07/17 0950    Visit Number  4    Number of Visits  17    Date for PT Re-Evaluation  12/23/17    Authorization Type  [employer] Medicare HMO (no auth required, no visit limit)    Authorization Time Period  10/28/17 - 12/27/17    Authorization - Visit Number  4    Authorization - Number of Visits  10    PT Start Time  0953    PT Stop Time  1033    PT Time Calculation (min)  40 min    Activity Tolerance  Patient tolerated treatment well    Behavior During Therapy  [facility] for tasks assessed/performed       Past Medical History:  Diagnosis Date  . Arteriosclerotic cardiovascular disease (ASCVD) 1993   Acute MI->PCI in 1993; asymptomatic PVCs  . ASCVD (arteriosclerotic cardiovascular disease) 1993   Status post MI and percutaneous intervention  . Colonic polyp    excised via colonoscope  . Coronary artery disease   . Degenerative joint disease    shoulder  . ED (erectile dysfunction)    Secondary to medication  . Headache   . Hyperglycemia   . Hyperlipidemia   . Hypertension   . Myocardial infarction (HCC)   . Obesity   . Stroke [facility](HCC)    "mini stroke, Bell's palsy"   . Testosterone deficiency    Erectile dysfunction    Past Surgical History:  Procedure Laterality Date  . CATARACT EXTRACTION, BILATERAL    . COLONOSCOPY W/ POLYPECTOMY  04/12/2010   MW[facility]:[id number]:Normal rectum/Pancolonic diverticula/Polyp at hepatic flexure and cecum, resected with snare technique as above  . KNEE ARTHROSCOPY     Arthroscopic surgery x 2  . SHOULDER SURGERY    . TRANSURETHRAL RESECTION OF BLADDER      There were no vitals filed for this visit.  Subjective  Assessment - 11/07/17 0950    Subjective  No muscle soreness or increase in pain after last session. 5/10 in bilateral kenes today, stating Rt hurts more than Left but rates them both as 5/10.    Pertinent History  patient has difficulty providing history secondary to attention to questions    Limitations  Standing;Walking;House hold activities    How long can you sit comfortably?  not limited    How long can you stand comfortably?  very limited    How long can you walk comfortably?  very limited (household distances are difficult)    Patient Stated Goals  to have less knee pain and walk better    Currently in Pain?  Yes    Pain Score  5     Pain Location  Knee    Pain Orientation  Right;Left;Medial;Lateral    Pain Onset  More than a month ago                       [facility] Treatment/Exercise - 11/07/17 0001      Knee/Hip Exercises: Stretches   Active Hamstring Stretch  Both    Active Hamstring Stretch Limitations  seated, 10x10" holds during towel slide    Knee: Self-Stretch to increase Flexion  Both    Knee: Self-Stretch Limitations  seated, 10x10" during towel slide      Knee/Hip Exercises: Seated   Long Arc Quad  Both;10 reps    Long Arc Quad Limitations  3-5" holds; 2#    Sit to [employer]Sand  10 reps;without UE support   22" mat height     Knee/Hip Exercises: Supine   Quad Sets  Both;1 set;10 reps    [employer] Tim[employer] CompanyQuad Sets Limitations  3-5" holds    Short Arc [employer] Timken CompanyQuad Sets  Both;10 reps    Heel Slides  AROM;Both;1 set;5 reps    Heel Slides Limitations  5" holds, min A to help get RLE started      Manual Therapy   Manual Therapy  Joint mobilization    Manual therapy comments  manual completed seperate from other interventions    Joint Mobilization  3 x 30 oscillations Grade III for AP glide to bilat tib/fib jts for extension; patellar mobs in all directions bilat             PT Education - 11/07/17 0959    Education Details  exercise technique, continue HEP     Person(s) Educated  Patient    Methods  Explanation;Demonstration    Comprehension  Verbalized understanding;Returned demonstration       PT Short Term Goals - 10/30/17 1031      PT SHORT TERM GOAL #1   Title  Patient will be independent with HEP, updated PRN, to improve/progress funcitonal ROM and strength to improve mobility.    Time  2    Period  Weeks    Status  On-going      PT SHORT TERM GOAL #2   Title  Patient will improve ROM for bil tibiofemoral joints by 8 degrees to demonstrate significant improvement in ROM to improve performance with gait and functional mobility.    Time  4    Period  Weeks    Status  On-going      PT SHORT TERM GOAL #3   Title  Patient will ambulate 100 feet with LRAD and safe gait pattern to demonstrate improve endurance and improved home/community access.    Time  4    Period  Weeks    Status  On-going        PT Long Term Goals - 10/30/17 1031      PT LONG TERM GOAL #1   Title  Patient will improve ROM for bil tibiofemoral joints by 15 degrees to demonstrate significant improvement in ROM to improve performance with gait and functional mobility.    Time  8    Period  Weeks    Status  On-going      PT LONG TERM GOAL #2   Title  Patient will improve MMT by 1 grade for limited groups to demonstrate improve functional LE strength to improve performance with functional gait and mobility.    Time  8    Period  Weeks    Status  On-going      PT LONG TERM GOAL #3   Title  Patient will ambulate 150 feet at 0.8 m/s to demonstrate improved home and community access and improve independence with mobiltiy.     Time  8    Period  Weeks    Status  On-going      PT LONG TERM GOAL #4   Title  Patient will ascend/descend 4x 6 inch stairs with modified independence to improve safety with home access as he  has stairs to enter.    Time  8    Period  Weeks    Status  On-going            Plan - 11/07/17 0950    Clinical Impression Statement   Continued with established POC focusing on bilateral knee ROM and overall pain control. Patient had most difficulty with supine heel slides on RLE requiring min assist to come from relative extension to flexion. Ended with manual to address joint hypomobility and decreased extension range this session. Pt tolerated treatment overall well without increased pain complaints at end of session. Was able to walk 50 feet without crutch use. Continue with current plan, progressing as able.     Rehab Potential  Fair    Clinical Impairments Affecting Rehab Potential  (-) financial restrictions    PT Frequency  2x / week    PT Duration  8 weeks    PT Treatment/Interventions  ADLs/Self Care Home Management;Aquatic Therapy;Cryotherapy;Electrical Stimulation;Moist Heat;DME Instruction;Gait training;Stair training;Functional mobility training;Therapeutic activities;Therapeutic exercise;Balance training;Neuromuscular re-education;Patient/family education;Manual techniques;Passive range of motion;Taping    PT Next Visit Plan  Continue manual interventison to bil knee joints and add quad low load long duration stretch in prone for HEP. Continue with supine strengthening, sit to stands, SAQ    PT Home Exercise Plan  Eval: seated hamstring stretch    Consulted and Agree with Plan of Care  Patient       Patient will benefit from skilled therapeutic intervention in order to improve the following deficits and impairments:  Abnormal gait, Decreased balance, Decreased endurance, Decreased mobility, Difficulty walking, Hypomobility, Increased muscle spasms, Improper body mechanics, Decreased range of motion, Decreased activity tolerance, Decreased strength, Decreased knowledge of use of DME, Increased fascial restricitons, Impaired flexibility, Pain  Visit Diagnosis: Chronic pain of left knee  Chronic pain of right knee  Other abnormalities of gait and mobility     Problem List Patient Active Problem List    Diagnosis Date Noted  . Primary osteoarthritis of both knees 01/07/2014  . Odynophagia 06/08/2013  . GERD (gastroesophageal reflux disease) 06/08/2013  . HYPERLIPIDEMIA 02/10/2010  . HYPERTENSION 03/24/2009  . ATHEROSCLEROTIC CARDIOVASCULAR DISEASE 03/24/2009  . DEGENERATIVE JOINT DISEASE 03/24/2009  . HYPERGLYCEMIA 03/24/2009    Katina Dung. Hartnett-Rands, MS, PT [facility] #[id number] 11/07/2017, 11:07 AM  [place] [facility] [address] [place], [place], [address] Phone: [phone]   Fax:  [phone]  Name: Glenn Brooks MRN: [id number] Date of Birth: December 30, 1929

## 2017-11-12 ENCOUNTER — Ambulatory Visit (HOSPITAL_COMMUNITY): Payer: Medicare HMO

## 2017-11-12 ENCOUNTER — Encounter (HOSPITAL_COMMUNITY): Payer: Self-pay

## 2017-11-12 DIAGNOSIS — M25562 Pain in left knee: Secondary | ICD-10-CM | POA: Diagnosis not present

## 2017-11-12 DIAGNOSIS — M25561 Pain in right knee: Secondary | ICD-10-CM

## 2017-11-12 DIAGNOSIS — G8929 Other chronic pain: Secondary | ICD-10-CM

## 2017-11-12 DIAGNOSIS — R2689 Other abnormalities of gait and mobility: Secondary | ICD-10-CM

## 2017-11-12 NOTE — Therapy (Addendum)
[place] [facility] [address] [place], [place], [address] Phone: [phone]   Fax:  [phone]  Physical Therapy Treatment  Patient Details  Name: Glenn Brooks MRN: [id number] Date of Birth: [birth year] Referring Provider: Oval Linseyondiego Richard, MD   Encounter Date: 11/12/2017  PT End of Session - 11/12/17 1152    Visit Number  5    Number of Visits  17    Date for PT Re-Evaluation  12/27/17   Minireassess 11/25/17   Authorization Type  [employer] Medicare HMO (no auth required, no visit limit)    Authorization Time Period  10/28/17 - 12/27/17    Authorization - Visit Number  5    Authorization - Number of Visits  10    PT Start Time  1125    PT Stop Time  1213    PT Time Calculation (min)  48 min    Activity Tolerance  Patient tolerated treatment well;No increased pain    Behavior During Therapy  WFL for tasks assessed/performed       Past Medical History:  Diagnosis Date  . Arteriosclerotic cardiovascular disease (ASCVD) 1993   Acute MI->PCI in 1993; asymptomatic PVCs  . ASCVD (arteriosclerotic cardiovascular disease) 1993   Status post MI and percutaneous intervention  . Colonic polyp    excised via colonoscope  . Coronary artery disease   . Degenerative joint disease    shoulder  . ED (erectile dysfunction)    Secondary to medication  . Headache   . Hyperglycemia   . Hyperlipidemia   . Hypertension   . Myocardial infarction (HCC)   . Obesity   . Stroke [facility](HCC)    "mini stroke, Bell's palsy"   . Testosterone deficiency    Erectile dysfunction    Past Surgical History:  Procedure Laterality Date  . CATARACT EXTRACTION, BILATERAL    . COLONOSCOPY W/ POLYPECTOMY  04/12/2010   ONG:EXBMWURMR:Normal rectum/Pancolonic diverticula/Polyp at hepatic flexure and cecum, resected with snare technique as above  . KNEE ARTHROSCOPY     Arthroscopic surgery x 2  . SHOULDER SURGERY    . TRANSURETHRAL RESECTION OF BLADDER      There were no vitals  filed for this visit.  Subjective Assessment - 11/12/17 1148    Subjective  Pt stated Bil knee pain scale 5/10, intermittent sharp pain.      Patient Stated Goals  to have less knee pain and walk better    Currently in Pain?  Yes    Pain Score  5     Pain Location  Knee    Pain Orientation  Right;Left    Pain Descriptors / Indicators  Aching;Sore;Sharp    Pain Type  Chronic pain    Pain Onset  More than a month ago    Pain Frequency  Constant    Aggravating Factors   walking    Pain Relieving Factors  sitting    Effect of Pain on Daily Activities  severe         [facility] PT Assessment - 11/12/17 0001      Assessment   Medical Diagnosis  Bil Knee Pain    Referring Provider  Oval Linseyondiego Richard, MD    Onset Date/Surgical Date  --   8-9 months ago   Next MD Visit  unsure    Prior Therapy  no                   [facility] Adult PT Treatment/Exercise - 11/12/17 0001  Exercises   Exercises  Knee/Hip      Knee/Hip Exercises: Stretches   Active Hamstring Stretch  Both    Active Hamstring Stretch Limitations  seated, 10x10" holds during towel slide    Knee: Self-Stretch to increase Flexion  Both    Knee: Self-Stretch Limitations  seated, 10x10" during towel slide      Knee/Hip Exercises: Seated   Long Arc Quad  Both;15 reps    Long Arc Quad Limitations  3-5" holds; 2#    Sit to [employer]Sand  10 reps;without UE support      Knee/Hip Exercises: Supine   Quad Sets  Both;1 set;10 reps    Short Arc [employer] Tim[employer] CompanyQuad Sets  Both;15 reps    Short Arc Quad Sets Limitations  3-5" holds    Heel Slides  AROM;Both;1 set;5 reps    Heel Slides Limitations  5" holds, min A to help get RLE started    Bridges  10 reps    Knee Extension  AROM    Knee Extension Limitations  Rt 25; Lt 15    Knee Flexion  AROM    Knee Flexion Limitations  Rt 112; Lt 125      Manual Therapy   Manual Therapy  Joint mobilization    Manual therapy comments  manual completed seperate from other interventions    Joint  Mobilization  3 x 30 oscillations Grade III for AP glide to bilat tib/fib jts for extension; patellar mobs in all directions bilat               PT Short Term Goals - 10/30/17 1031      PT SHORT TERM GOAL #1   Title  Patient will be independent with HEP, updated PRN, to improve/progress funcitonal ROM and strength to improve mobility.    Time  2    Period  Weeks    Status  On-going      PT SHORT TERM GOAL #2   Title  Patient will improve ROM for bil tibiofemoral joints by 8 degrees to demonstrate significant improvement in ROM to improve performance with gait and functional mobility.    Time  4    Period  Weeks    Status  On-going      PT SHORT TERM GOAL #3   Title  Patient will ambulate 100 feet with LRAD and safe gait pattern to demonstrate improve endurance and improved home/community access.    Time  4    Period  Weeks    Status  On-going        PT Long Term Goals - 10/30/17 1031      PT LONG TERM GOAL #1   Title  Patient will improve ROM for bil tibiofemoral joints by 15 degrees to demonstrate significant improvement in ROM to improve performance with gait and functional mobility.    Time  8    Period  Weeks    Status  On-going      PT LONG TERM GOAL #2   Title  Patient will improve MMT by 1 grade for limited groups to demonstrate improve functional LE strength to improve performance with functional gait and mobility.    Time  8    Period  Weeks    Status  On-going      PT LONG TERM GOAL #3   Title  Patient will ambulate 150 feet at 0.8 m/s to demonstrate improved home and community access and improve independence with mobiltiy.     Time  8    Period  Weeks    Status  On-going      PT LONG TERM GOAL #4   Title  Patient will ascend/descend 4x 6 inch stairs with modified independence to improve safety with home access as he has stairs to enter.    Time  8    Period  Weeks    Status  On-going            Plan - 11/12/17 1454    Clinical Impression  Statement  Session focus with knee mobility and pain control.  Attempted prone low load long duration quad stretch, pt unable to tolerate prone position, DC from plan.  Pt continues to have extreme difficulty with transition from extension to flexion with Rt knee during supine heel slides, reports it locks up.  Continued with manual joint mobs for pain control to improve mobility.  Measurements taken with improved ROM Bil knee.  No reports of increased pain.    Rehab Potential  Fair    Clinical Impairments Affecting Rehab Potential  (-) financial restrictions    PT Frequency  2x / week    PT Duration  8 weeks    PT Treatment/Interventions  ADLs/Self Care Home Management;Aquatic Therapy;Cryotherapy;Electrical Stimulation;Moist Heat;DME Instruction;Gait training;Stair training;Functional mobility training;Therapeutic activities;Therapeutic exercise;Balance training;Neuromuscular re-education;Patient/family education;Manual techniques;Passive range of motion;Taping    PT Next Visit Plan  Continue manual interventison to bil knee joints.  Continue with supine strengthening, sit to stands, SAQ    PT Home Exercise Plan  Eval: seated hamstring stretch       Patient will benefit from skilled therapeutic intervention in order to improve the following deficits and impairments:  Abnormal gait, Decreased balance, Decreased endurance, Decreased mobility, Difficulty walking, Hypomobility, Increased muscle spasms, Improper body mechanics, Decreased range of motion, Decreased activity tolerance, Decreased strength, Decreased knowledge of use of DME, Increased fascial restricitons, Impaired flexibility, Pain  Visit Diagnosis: Chronic pain of left knee  Chronic pain of right knee  Other abnormalities of gait and mobility     Problem List Patient Active Problem List   Diagnosis Date Noted  . Primary osteoarthritis of both knees 01/07/2014  . Odynophagia 06/08/2013  . GERD (gastroesophageal reflux disease)  06/08/2013  . HYPERLIPIDEMIA 02/10/2010  . HYPERTENSION 03/24/2009  . ATHEROSCLEROTIC CARDIOVASCULAR DISEASE 03/24/2009  . DEGENERATIVE JOINT DISEASE 03/24/2009  . HYPERGLYCEMIA 03/24/2009   Becky Sax, LPTA; CBIS [phone] Juel Burrow 11/12/2017, 3:04 PM  [facility] [address] [place], [place], [address] Phone: [phone]   Fax:  [phone]  Name: Glenn Brooks MRN: [id number] Date of Birth: Mar 25, 1930

## 2017-11-13 ENCOUNTER — Telehealth (HOSPITAL_COMMUNITY): Payer: Self-pay | Admitting: Physical Therapy

## 2017-11-13 NOTE — Telephone Encounter (Signed)
Trying to work 90 min wound pt in and l/m to ask this pt to come in at 8:15 11/14/17.NF

## 2017-11-14 ENCOUNTER — Ambulatory Visit (HOSPITAL_COMMUNITY): Payer: Medicare HMO | Admitting: Physical Therapy

## 2017-11-14 DIAGNOSIS — G8929 Other chronic pain: Secondary | ICD-10-CM

## 2017-11-14 DIAGNOSIS — M25562 Pain in left knee: Principal | ICD-10-CM

## 2017-11-14 DIAGNOSIS — M25561 Pain in right knee: Secondary | ICD-10-CM

## 2017-11-14 DIAGNOSIS — R2689 Other abnormalities of gait and mobility: Secondary | ICD-10-CM

## 2017-11-14 NOTE — Therapy (Signed)
[place] [facility] [address] [place], [place], [address] Phone: [phone]   Fax:  [phone]  Physical Therapy Treatment  Patient Details  Name: Glenn EstimableClyde M Russman MRN: [id number] Date of Birth: 1929/12/30 Referring Provider: Oval Linseyondiego Richard, MD   Encounter Date: 11/14/2017  PT End of Session - 11/14/17 0943    Visit Number  6    Number of Visits  17    Date for PT Re-Evaluation  12/27/17   Minireassess 11/25/17   Authorization Type  [employer] Medicare HMO (no auth required, no visit limit)    Authorization Time Period  10/28/17 - 12/27/17    Authorization - Visit Number  6    Authorization - Number of Visits  10    PT Start Time  0908    PT Stop Time  0946    PT Time Calculation (min)  38 min    Activity Tolerance  Patient tolerated treatment well;No increased pain    Behavior During Therapy  WFL for tasks assessed/performed       Past Medical History:  Diagnosis Date  . Arteriosclerotic cardiovascular disease (ASCVD) 1993   Acute MI->PCI in 1993; asymptomatic PVCs  . ASCVD (arteriosclerotic cardiovascular disease) 1993   Status post MI and percutaneous intervention  . Colonic polyp    excised via colonoscope  . Coronary artery disease   . Degenerative joint disease    shoulder  . ED (erectile dysfunction)    Secondary to medication  . Headache   . Hyperglycemia   . Hyperlipidemia   . Hypertension   . Myocardial infarction (HCC)   . Obesity   . Stroke [facility](HCC)    "mini stroke, Bell's palsy"   . Testosterone deficiency    Erectile dysfunction    Past Surgical History:  Procedure Laterality Date  . CATARACT EXTRACTION, BILATERAL    . COLONOSCOPY W/ POLYPECTOMY  04/12/2010   GMW:[id number]:Normal rectum/Pancolonic diverticula/Polyp at hepatic flexure and cecum, resected with snare technique as above  . KNEE ARTHROSCOPY     Arthroscopic surgery x 2  . SHOULDER SURGERY    . TRANSURETHRAL RESECTION OF BLADDER      There were no vitals  filed for this visit.  Subjective Assessment - 11/14/17 0916    Subjective  Pt states his pain remains 5/10 and states his knees continue to wake him up at night having to take more meds and move his legs around.     Currently in Pain?  Yes    Pain Score  5     Pain Location  Knee    Pain Orientation  Right;Left    Pain Descriptors / Indicators  Aching    Pain Type  Chronic pain    Pain Radiating Towards  right hurts more                       [facility] Adult PT Treatment/Exercise - 11/14/17 0001      Knee/Hip Exercises: Stretches   Active Hamstring Stretch  Both    Active Hamstring Stretch Limitations  seated, 10x10" holds during towel slide    Knee: Self-Stretch to increase Flexion  Both    Knee: Self-Stretch Limitations  seated, 10x10" during towel slide      Knee/Hip Exercises: Seated   Long Arc Quad  Both;15 reps    Long Arc Quad Limitations  3-5" holds; 3#    Sit to [employer]Sand  10 reps;without UE support      Knee/Hip  Exercises: Supine   Quad Sets  Both;1 set;15 reps    Short Arc [employer]  Both;15 reps    Short Arc Quad Sets Limitations  3 sec hold, 3#    Heel Slides  AROM;Both;1 set;5 reps    Bridges  10 reps    Straight Leg Raises  Both;10 reps    Knee Extension  AROM             PT Education - 11/14/17 0948    Education Details  dangers of leaning on crutches and keeping upright posturing with ambulation.    Person(s) Educated  Patient    Methods  Explanation    Comprehension  Verbalized understanding       PT Short Term Goals - 10/30/17 1031      PT SHORT TERM GOAL #1   Title  Patient will be independent with HEP, updated PRN, to improve/progress funcitonal ROM and strength to improve mobility.    Time  2    Period  Weeks    Status  On-going      PT SHORT TERM GOAL #2   Title  Patient will improve ROM for bil tibiofemoral joints by 8 degrees to demonstrate significant improvement in ROM to improve performance with gait and functional  mobility.    Time  4    Period  Weeks    Status  On-going      PT SHORT TERM GOAL #3   Title  Patient will ambulate 100 feet with LRAD and safe gait pattern to demonstrate improve endurance and improved home/community access.    Time  4    Period  Weeks    Status  On-going        PT Long Term Goals - 10/30/17 1031      PT LONG TERM GOAL #1   Title  Patient will improve ROM for bil tibiofemoral joints by 15 degrees to demonstrate significant improvement in ROM to improve performance with gait and functional mobility.    Time  8    Period  Weeks    Status  On-going      PT LONG TERM GOAL #2   Title  Patient will improve MMT by 1 grade for limited groups to demonstrate improve functional LE strength to improve performance with functional gait and mobility.    Time  8    Period  Weeks    Status  On-going      PT LONG TERM GOAL #3   Title  Patient will ambulate 150 feet at 0.8 m/s to demonstrate improved home and community access and improve independence with mobiltiy.     Time  8    Period  Weeks    Status  On-going      PT LONG TERM GOAL #4   Title  Patient will ascend/descend 4x 6 inch stairs with modified independence to improve safety with home access as he has stairs to enter.    Time  8    Period  Weeks    Status  On-going            Plan - 11/14/17 9604    Clinical Impression Statement  Continued with focus on knee mobility, strengthening and helping to reduce pain. able to increase to 3# quad strengthening weight and added SLR today to POC.   Noted improperly adjusted axillary crutches and set both on 5'8" position and leveled up hand settings.  Pt with much improved posturing following adjustment and educated  on not leaning on arm rests due to possible nerve damage.  Pt verbalized understanding and able to demonstrate correct posturing.      Rehab Potential  Fair    Clinical Impairments Affecting Rehab Potential  (-) financial restrictions    PT Frequency  2x /  week    PT Duration  8 weeks    PT Treatment/Interventions  ADLs/Self Care Home Management;Aquatic Therapy;Cryotherapy;Electrical Stimulation;Moist Heat;DME Instruction;Gait training;Stair training;Functional mobility training;Therapeutic activities;Therapeutic exercise;Balance training;Neuromuscular re-education;Patient/family education;Manual techniques;Passive range of motion;Taping    PT Next Visit Plan  Continue manual intervention to bil knee joints.  Continue with supine strengthening, sit to stands, SAQ    PT Home Exercise Plan  Eval: seated hamstring stretch       Patient will benefit from skilled therapeutic intervention in order to improve the following deficits and impairments:  Abnormal gait, Decreased balance, Decreased endurance, Decreased mobility, Difficulty walking, Hypomobility, Increased muscle spasms, Improper body mechanics, Decreased range of motion, Decreased activity tolerance, Decreased strength, Decreased knowledge of use of DME, Increased fascial restricitons, Impaired flexibility, Pain  Visit Diagnosis: Chronic pain of left knee  Chronic pain of right knee  Other abnormalities of gait and mobility     Problem List Patient Active Problem List   Diagnosis Date Noted  . Primary osteoarthritis of both knees 01/07/2014  . Odynophagia 06/08/2013  . GERD (gastroesophageal reflux disease) 06/08/2013  . HYPERLIPIDEMIA 02/10/2010  . HYPERTENSION 03/24/2009  . ATHEROSCLEROTIC CARDIOVASCULAR DISEASE 03/24/2009  . DEGENERATIVE JOINT DISEASE 03/24/2009  . HYPERGLYCEMIA 03/24/2009   Lurena Nida, PTA/CLT [phone]  Lurena Nida 11/14/2017, 9:49 AM  [place] [facility] [address] [place], [place], [address] Phone: [phone]   Fax:  [phone]  Name: EROS MONTOUR MRN: [id number] Date of Birth: 1929-12-10

## 2017-11-19 ENCOUNTER — Ambulatory Visit (HOSPITAL_COMMUNITY): Payer: Medicare HMO

## 2017-11-19 ENCOUNTER — Encounter (HOSPITAL_COMMUNITY): Payer: Self-pay

## 2017-11-19 ENCOUNTER — Telehealth (HOSPITAL_COMMUNITY): Payer: Self-pay | Admitting: Family Medicine

## 2017-11-19 ENCOUNTER — Other Ambulatory Visit: Payer: Self-pay

## 2017-11-19 DIAGNOSIS — R2689 Other abnormalities of gait and mobility: Secondary | ICD-10-CM

## 2017-11-19 DIAGNOSIS — M25562 Pain in left knee: Secondary | ICD-10-CM | POA: Diagnosis not present

## 2017-11-19 DIAGNOSIS — M25561 Pain in right knee: Secondary | ICD-10-CM

## 2017-11-19 DIAGNOSIS — G8929 Other chronic pain: Secondary | ICD-10-CM

## 2017-11-19 NOTE — Therapy (Signed)
[facility] [address] [place], [place], [address] Phone: [phone]   Fax:  [phone]  Physical Therapy Treatment  Patient Details  Name: Glenn Brooks MRN: [id number] Date of Birth: 05-19-1929 Referring Provider: Oval Linsey, MD   Encounter Date: 11/19/2017  PT End of Session - 11/19/17 1114    Visit Number  7    Number of Visits  17    Date for PT Re-Evaluation  12/27/17   Minireassess 11/25/17   Authorization Type  [employer] Medicare HMO (no auth required, no visit limit)    Authorization Time Period  10/28/17 - 12/27/17    Authorization - Visit Number  7    Authorization - Number of Visits  10    PT Start Time  1050   patient late   PT Stop Time  1116    PT Time Calculation (min)  26 min    Activity Tolerance  Patient tolerated treatment well;No increased pain    Behavior During Therapy  WFL for tasks assessed/performed       Past Medical History:  Diagnosis Date  . Arteriosclerotic cardiovascular disease (ASCVD) 1993   Acute MI->PCI in 1993; asymptomatic PVCs  . ASCVD (arteriosclerotic cardiovascular disease) 1993   Status post MI and percutaneous intervention  . Colonic polyp    excised via colonoscope  . Coronary artery disease   . Degenerative joint disease    shoulder  . ED (erectile dysfunction)    Secondary to medication  . Headache   . Hyperglycemia   . Hyperlipidemia   . Hypertension   . Myocardial infarction (HCC)   . Obesity   . Stroke [facility])    "mini stroke, Bell's palsy"   . Testosterone deficiency    Erectile dysfunction    Past Surgical History:  Procedure Laterality Date  . CATARACT EXTRACTION, BILATERAL    . COLONOSCOPY W/ POLYPECTOMY  04/12/2010   ONG:EXBMWU rectum/Pancolonic diverticula/Polyp at hepatic flexure and cecum, resected with snare technique as above  . KNEE ARTHROSCOPY     Arthroscopic surgery x 2  . SHOULDER SURGERY    . TRANSURETHRAL RESECTION OF BLADDER      There  were no vitals filed for this visit.  Subjective Assessment - 11/19/17 1054    Subjective  Patient arrives late and reports he drove past the office and had to come back. He reports 5/10 pain today.    Pertinent History  patient has difficulty providing history secondary to attention to questions    Limitations  Standing;Walking;House hold activities    How long can you sit comfortably?  not limited    How long can you stand comfortably?  very limited    How long can you walk comfortably?  very limited (household distances are difficult)    Patient Stated Goals  to have less knee pain and walk better    Currently in Pain?  Yes    Pain Score  5     Pain Orientation  Left;Right    Pain Descriptors / Indicators  Aching    Pain Type  Chronic pain    Pain Onset  More than a month ago    Pain Frequency  Constant    Aggravating Factors   walking, weight bearing    Pain Relieving Factors  sitting    Effect of Pain on Daily Activities  severe        [facility] Adult PT Treatment/Exercise - 11/19/17 0001      Knee/Hip  Exercises: Stretches   Active Hamstring Stretch  Both;30 seconds;3 reps    Active Hamstring Stretch Limitations  12" box at // bars      Manual Therapy   Manual Therapy  Joint mobilization;Passive ROM    Manual therapy comments  manual completed seperate from other interventions    Joint Mobilization  3x 30-45 seconds grade III oscillations for AP glide to bil tibiofemoral joint to improve flexion; 3x 30-45 seconds grade III oscillations for PA glide to bil tibiofemoral joint to improve extension    Passive ROM  3x 10 reps (between joint mobilization) bil tibfem joints        PT Education - 11/19/17 1253    Education Details  Educated patient on compliance with attendence to be on time for therapy appointment. Informed patient we will not have a full session as patient was late for appointment today.    Person(s) Educated  Patient    Methods  Explanation    Comprehension   Verbalized understanding       PT Short Term Goals - 10/30/17 1031      PT SHORT TERM GOAL #1   Title  Patient will be independent with HEP, updated PRN, to improve/progress funcitonal ROM and strength to improve mobility.    Time  2    Period  Weeks    Status  On-going      PT SHORT TERM GOAL #2   Title  Patient will improve ROM for bil tibiofemoral joints by 8 degrees to demonstrate significant improvement in ROM to improve performance with gait and functional mobility.    Time  4    Period  Weeks    Status  On-going      PT SHORT TERM GOAL #3   Title  Patient will ambulate 100 feet with LRAD and safe gait pattern to demonstrate improve endurance and improved home/community access.    Time  4    Period  Weeks    Status  On-going        PT Long Term Goals - 10/30/17 1031      PT LONG TERM GOAL #1   Title  Patient will improve ROM for bil tibiofemoral joints by 15 degrees to demonstrate significant improvement in ROM to improve performance with gait and functional mobility.    Time  8    Period  Weeks    Status  On-going      PT LONG TERM GOAL #2   Title  Patient will improve MMT by 1 grade for limited groups to demonstrate improve functional LE strength to improve performance with functional gait and mobility.    Time  8    Period  Weeks    Status  On-going      PT LONG TERM GOAL #3   Title  Patient will ambulate 150 feet at 0.8 m/s to demonstrate improved home and community access and improve independence with mobiltiy.     Time  8    Period  Weeks    Status  On-going      PT LONG TERM GOAL #4   Title  Patient will ascend/descend 4x 6 inch stairs with modified independence to improve safety with home access as he has stairs to enter.    Time  8    Period  Weeks    Status  On-going        Plan - 11/19/17 1255    Clinical Impression Statement  Patient arrived 17 minutes late  today, which limited therapy session. Session focused primarily on joint mobility as  patient has been instructed to perform HEP at home. His Rt knee remains more limited with mobility compared to Lt and patient reported some discomfort during standing hamstring stretch. Patient reported good tolerance to joint mobilization and denied increased pain. I educated the patient on the importance of arriving on time to have a full session to improve and progress exercises as able. He will continue to benefit from skilled PT interventions to address impairments and progress mobility.     Rehab Potential  Fair    Clinical Impairments Affecting Rehab Potential  (-) financial restrictions    PT Frequency  2x / week    PT Duration  8 weeks    PT Treatment/Interventions  ADLs/Self Care Home Management;Aquatic Therapy;Cryotherapy;Electrical Stimulation;Moist Heat;DME Instruction;Gait training;Stair training;Functional mobility training;Therapeutic activities;Therapeutic exercise;Balance training;Neuromuscular re-education;Patient/family education;Manual techniques;Passive range of motion;Taping    PT Next Visit Plan  Continue manual intervention to bil knee joints.  Continue with supine strengthening, sit to stands. Progress HEP as able. Trial gait with RW.     PT Home Exercise Plan  Eval: seated hamstring stretch    Consulted and Agree with Plan of Care  Patient       Patient will benefit from skilled therapeutic intervention in order to improve the following deficits and impairments:  Abnormal gait, Decreased balance, Decreased endurance, Decreased mobility, Difficulty walking, Hypomobility, Increased muscle spasms, Improper body mechanics, Decreased range of motion, Decreased activity tolerance, Decreased strength, Decreased knowledge of use of DME, Increased fascial restricitons, Impaired flexibility, Pain  Visit Diagnosis: Chronic pain of left knee  Chronic pain of right knee  Other abnormalities of gait and mobility     Problem List Patient Active Problem List   Diagnosis Date  Noted  . Primary osteoarthritis of both knees 01/07/2014  . Odynophagia 06/08/2013  . GERD (gastroesophageal reflux disease) 06/08/2013  . HYPERLIPIDEMIA 02/10/2010  . HYPERTENSION 03/24/2009  . ATHEROSCLEROTIC CARDIOVASCULAR DISEASE 03/24/2009  . DEGENERATIVE JOINT DISEASE 03/24/2009  . HYPERGLYCEMIA 03/24/2009     Valentino Saxon, PT, DPT Physical Therapist with [facility]  11/19/2017 12:57 PM    [facility] [address] [place], [place], [address] Phone: [phone]   Fax:  [phone]  Name: Glenn Brooks MRN: [id number] Date of Birth: 02-28-1930

## 2017-11-19 NOTE — Telephone Encounter (Signed)
11/19/17  pt said that he had another appt at the same time and needed to change it... we rescheduled for 8/30 instead

## 2017-11-21 ENCOUNTER — Encounter (HOSPITAL_COMMUNITY): Payer: Medicare HMO

## 2017-11-22 ENCOUNTER — Ambulatory Visit (HOSPITAL_COMMUNITY): Payer: Medicare HMO | Admitting: Physical Therapy

## 2017-11-22 ENCOUNTER — Encounter (HOSPITAL_COMMUNITY): Payer: Self-pay | Admitting: Physical Therapy

## 2017-11-22 DIAGNOSIS — M25562 Pain in left knee: Secondary | ICD-10-CM | POA: Diagnosis not present

## 2017-11-22 DIAGNOSIS — G8929 Other chronic pain: Secondary | ICD-10-CM

## 2017-11-22 DIAGNOSIS — M25561 Pain in right knee: Secondary | ICD-10-CM

## 2017-11-22 DIAGNOSIS — R2689 Other abnormalities of gait and mobility: Secondary | ICD-10-CM

## 2017-11-22 NOTE — Therapy (Signed)
[facility] [address] [place], [place], [address] Phone: [phone]   Fax:  [phone]  Physical Therapy Treatment  Patient Details  Name: Glenn Brooks MRN: [id number] Date of Birth: Sep 26, 1929 Referring Provider: Oval Linsey, MD   Encounter Date: 11/22/2017  PT End of Session - 11/22/17 0953    Visit Number  8    Number of Visits  17    Date for PT Re-Evaluation  12/27/17   Minireassess 11/25/17   Authorization Type  [employer] Medicare HMO (no auth required, no visit limit)    Authorization Time Period  10/28/17 - 12/27/17    Authorization - Visit Number  8    Authorization - Number of Visits  10    PT Start Time  0946    PT Stop Time  1025    PT Time Calculation (min)  39 min    Activity Tolerance  Patient tolerated treatment well;No increased pain    Behavior During Therapy  WFL for tasks assessed/performed       Past Medical History:  Diagnosis Date  . Arteriosclerotic cardiovascular disease (ASCVD) 1993   Acute MI->PCI in 1993; asymptomatic PVCs  . ASCVD (arteriosclerotic cardiovascular disease) 1993   Status post MI and percutaneous intervention  . Colonic polyp    excised via colonoscope  . Coronary artery disease   . Degenerative joint disease    shoulder  . ED (erectile dysfunction)    Secondary to medication  . Headache   . Hyperglycemia   . Hyperlipidemia   . Hypertension   . Myocardial infarction (HCC)   . Obesity   . Stroke [facility])    "mini stroke, Bell's palsy"   . Testosterone deficiency    Erectile dysfunction    Past Surgical History:  Procedure Laterality Date  . CATARACT EXTRACTION, BILATERAL    . COLONOSCOPY W/ POLYPECTOMY  04/12/2010   ONG:EXBMWU rectum/Pancolonic diverticula/Polyp at hepatic flexure and cecum, resected with snare technique as above  . KNEE ARTHROSCOPY     Arthroscopic surgery x 2  . SHOULDER SURGERY    . TRANSURETHRAL RESECTION OF BLADDER      There were no vitals  filed for this visit.  Subjective Assessment - 11/22/17 0951    Subjective  Patient stated he is having 5/10 pain this session which he stated is a soreness.     Pertinent History  patient has difficulty providing history secondary to attention to questions    Limitations  Standing;Walking;House hold activities    How long can you sit comfortably?  not limited    How long can you stand comfortably?  very limited    How long can you walk comfortably?  very limited (household distances are difficult)    Patient Stated Goals  to have less knee pain and walk better    Currently in Pain?  Yes    Pain Score  5     Pain Location  Knee    Pain Orientation  Right;Left    Pain Descriptors / Indicators  Sore    Pain Type  Chronic pain    Pain Onset  More than a month ago                       [facility] Adult PT Treatment/Exercise - 11/22/17 0001      Knee/Hip Exercises: Stretches   Active Hamstring Stretch  Both    Active Hamstring Stretch Limitations  seated, 10x10" during towel  slide    Knee: Self-Stretch to increase Flexion  Both    Knee: Self-Stretch Limitations  seated, 10x10" during towel slide      Knee/Hip Exercises: Seated   Long Arc Quad  Both;15 reps    Long Arc Quad Limitations  3-5" holds; 3#    Abd/Adduction Limitations  x15 both with ball squeeze and pressing out into therapist's hands    Sit to [employer]Sand  10 reps;without UE support      Knee/Hip Exercises: Supine   Quad Sets  Both;1 set;15 reps    Short Arc [employer] Tim[employer] CompanyQuad Sets  Both;15 reps    Short Arc Quad Sets Limitations  3 sec holds, 3# on the left no weight on the right    Heel Slides  AROM;Both;1 set;5 reps;Limitations    Bridges  10 reps    Straight Leg Raises  Both;10 reps             PT Education - 11/22/17 [phone]    Education Details  Patient was educated on purpose and technique of exercsies throughout session.     Person(s) Educated  Patient    Methods  Explanation    Comprehension  Verbalized  understanding       PT Short Term Goals - 10/30/17 1031      PT SHORT TERM GOAL #1   Title  Patient will be independent with HEP, updated PRN, to improve/progress funcitonal ROM and strength to improve mobility.    Time  2    Period  Weeks    Status  On-going      PT SHORT TERM GOAL #2   Title  Patient will improve ROM for bil tibiofemoral joints by 8 degrees to demonstrate significant improvement in ROM to improve performance with gait and functional mobility.    Time  4    Period  Weeks    Status  On-going      PT SHORT TERM GOAL #3   Title  Patient will ambulate 100 feet with LRAD and safe gait pattern to demonstrate improve endurance and improved home/community access.    Time  4    Period  Weeks    Status  On-going        PT Long Term Goals - 10/30/17 1031      PT LONG TERM GOAL #1   Title  Patient will improve ROM for bil tibiofemoral joints by 15 degrees to demonstrate significant improvement in ROM to improve performance with gait and functional mobility.    Time  8    Period  Weeks    Status  On-going      PT LONG TERM GOAL #2   Title  Patient will improve MMT by 1 grade for limited groups to demonstrate improve functional LE strength to improve performance with functional gait and mobility.    Time  8    Period  Weeks    Status  On-going      PT LONG TERM GOAL #3   Title  Patient will ambulate 150 feet at 0.8 m/s to demonstrate improved home and community access and improve independence with mobiltiy.     Time  8    Period  Weeks    Status  On-going      PT LONG TERM GOAL #4   Title  Patient will ascend/descend 4x 6 inch stairs with modified independence to improve safety with home access as he has stairs to enter.    Time  8  Period  Weeks    Status  On-going            Plan - 11/22/17 1030    Clinical Impression Statement  This session continued with established plan of care. This session added seated abduction/adduction strengthening with  ball squeeze and therapist providing resistance for hip abduction. This session patient was unable to perform short arc quads on the right with ankle weight. Patient demonstrated continued knee flexion and crouched gait with ambulation into and out of clinic. Plan to continue with established plan of care.     Rehab Potential  Fair    Clinical Impairments Affecting Rehab Potential  (-) financial restrictions    PT Frequency  2x / week    PT Duration  8 weeks    PT Treatment/Interventions  ADLs/Self Care Home Management;Aquatic Therapy;Cryotherapy;Electrical Stimulation;Moist Heat;DME Instruction;Gait training;Stair training;Functional mobility training;Therapeutic activities;Therapeutic exercise;Balance training;Neuromuscular re-education;Patient/family education;Manual techniques;Passive range of motion;Taping    PT Next Visit Plan  Continue manual intervention to bil knee joints.  Continue with supine strengthening, sit to stands. Progress HEP as able. Trial gait with RW.     PT Home Exercise Plan  Eval: seated hamstring stretch    Consulted and Agree with Plan of Care  Patient       Patient will benefit from skilled therapeutic intervention in order to improve the following deficits and impairments:  Abnormal gait, Decreased balance, Decreased endurance, Decreased mobility, Difficulty walking, Hypomobility, Increased muscle spasms, Improper body mechanics, Decreased range of motion, Decreased activity tolerance, Decreased strength, Decreased knowledge of use of DME, Increased fascial restricitons, Impaired flexibility, Pain  Visit Diagnosis: Chronic pain of left knee  Chronic pain of right knee  Other abnormalities of gait and mobility     Problem List Patient Active Problem List   Diagnosis Date Noted  . Primary osteoarthritis of both knees 01/07/2014  . Odynophagia 06/08/2013  . GERD (gastroesophageal reflux disease) 06/08/2013  . HYPERLIPIDEMIA 02/10/2010  . HYPERTENSION  03/24/2009  . ATHEROSCLEROTIC CARDIOVASCULAR DISEASE 03/24/2009  . DEGENERATIVE JOINT DISEASE 03/24/2009  . HYPERGLYCEMIA 03/24/2009   Verne Carrow PT, DPT 10:31 AM, 11/22/17 [phone]  [facility] [address] [place], [place], [address] Phone: [phone]   Fax:  [phone]  Name: Glenn Brooks MRN: [id number] Date of Birth: 01/19/1930

## 2017-11-26 ENCOUNTER — Encounter (HOSPITAL_COMMUNITY): Payer: Self-pay

## 2017-11-26 ENCOUNTER — Ambulatory Visit (HOSPITAL_COMMUNITY): Payer: Medicare HMO | Attending: Family Medicine

## 2017-11-26 ENCOUNTER — Other Ambulatory Visit: Payer: Self-pay

## 2017-11-26 DIAGNOSIS — M25562 Pain in left knee: Secondary | ICD-10-CM | POA: Diagnosis not present

## 2017-11-26 DIAGNOSIS — G8929 Other chronic pain: Secondary | ICD-10-CM | POA: Diagnosis present

## 2017-11-26 DIAGNOSIS — M25561 Pain in right knee: Secondary | ICD-10-CM | POA: Insufficient documentation

## 2017-11-26 DIAGNOSIS — R2689 Other abnormalities of gait and mobility: Secondary | ICD-10-CM | POA: Diagnosis present

## 2017-11-26 NOTE — Therapy (Signed)
[facility] [address] [place], [place], [address] Phone: [phone]   Fax:  [phone]  Physical Therapy Treatment/Progress Note  Patient Details  Name: Glenn Brooks MRN: [id number] Date of Birth: 31-Jan-1930 Referring Provider: Oval Linsey, MD   Encounter Date: 11/26/2017   Progress Note Reporting Period 10/28/17 to 11/26/17  See note below for Objective Data and Assessment of Progress/Goals.     PT End of Session - 11/26/17 0940    Visit Number  9    Number of Visits  17    Date for PT Re-Evaluation  12/27/17   Minireassess 11/25/17   Authorization Type  [employer] Medicare HMO (no auth required, no visit limit)    Authorization Time Period  10/28/17 - 12/27/17    Authorization - Visit Number  1    Authorization - Number of Visits  10    PT Start Time  0907    PT Stop Time  0947    PT Time Calculation (min)  40 min    Activity Tolerance  Patient tolerated treatment well;No increased pain    Behavior During Therapy  WFL for tasks assessed/performed       Past Medical History:  Diagnosis Date  . Arteriosclerotic cardiovascular disease (ASCVD) 1993   Acute MI->PCI in 1993; asymptomatic PVCs  . ASCVD (arteriosclerotic cardiovascular disease) 1993   Status post MI and percutaneous intervention  . Colonic polyp    excised via colonoscope  . Coronary artery disease   . Degenerative joint disease    shoulder  . ED (erectile dysfunction)    Secondary to medication  . Headache   . Hyperglycemia   . Hyperlipidemia   . Hypertension   . Myocardial infarction (HCC)   . Obesity   . Stroke [facility])    "mini stroke, Bell's palsy"   . Testosterone deficiency    Erectile dysfunction    Past Surgical History:  Procedure Laterality Date  . CATARACT EXTRACTION, BILATERAL    . COLONOSCOPY W/ POLYPECTOMY  04/12/2010   XBM:[id number] rectum/Pancolonic diverticula/Polyp at hepatic flexure and cecum, resected with snare technique as above   . KNEE ARTHROSCOPY     Arthroscopic surgery x 2  . SHOULDER SURGERY    . TRANSURETHRAL RESECTION OF BLADDER      There were no vitals filed for this visit.  Subjective Assessment - 11/26/17 0910    Subjective  Patient reports he is doing his exercises every other day and states his pain is around a 5/10.    Pertinent History  patient has difficulty providing history secondary to attention to questions    Limitations  Standing;Walking;House hold activities    How long can you sit comfortably?  not limited    How long can you stand comfortably?  very limited    How long can you walk comfortably?  very limited (household distances are difficult)    Patient Stated Goals  to have less knee pain and walk better    Currently in Pain?  Yes    Pain Score  5     Pain Location  Knee    Pain Orientation  Right;Left    Pain Descriptors / Indicators  Aching;Sore    Pain Type  Chronic pain    Pain Onset  More than a month ago    Pain Frequency  Constant    Aggravating Factors   walking    Pain Relieving Factors  rest    Effect of Pain on  Daily Activities  severe        [facility] PT Assessment - 11/26/17 0001      Assessment   Medical Diagnosis  Bil Knee Pain    Referring Provider  Oval Linsey, MD    Next MD Visit  none right now   had one 11/21/17   Prior Therapy  no      Precautions   Precautions  Fall      Restrictions   Weight Bearing Restrictions  No      Prior Function   Level of Independence  Requires assistive device for independence;Independent with household mobility with device      AROM   Right Knee Extension  25   was 32   Right Knee Flexion  110   was 108   Left Knee Extension  14   was 19   Left Knee Flexion  112   was 106     PROM   Right Knee Flexion  114   was 110   Left Knee Flexion  116   was 112     Strength   Right Hip Flexion  3+/5   2+   Right Hip ABduction  3+/5   2+   Left Hip Flexion  4-/5   2+   Left Hip ABduction  3+/5   2+    Right Knee Flexion  3-/5   tested in sitting   Right Knee Extension  4-/5   3+   Left Knee Flexion  3-/5   tested in sitting   Left Knee Extension  4/5   3+   Right Ankle Dorsiflexion  4/5   3+   Left Ankle Dorsiflexion  4/5   3+     Ambulation/Gait   Ambulation/Gait  Yes    Ambulation/Gait Assistance  5: Supervision    Ambulation Distance (Feet)  128 Feet     Assistive device  R Axillary Crutch;L Axillary Crutch    Gait Pattern  Step-to pattern;Decreased step length - left;Decreased step length - right;Decreased stride length;Right flexed knee in stance;Left flexed knee in stance;Shuffle;Trunk flexed;Poor foot clearance - left;Poor foot clearance - right    Gait velocity  0.32 m/s   was 0.27 m/s   Gait Comments  patient is remains unsteady with gait and ambulates primarily with bil axillary crutches        [facility] Adult PT Treatment/Exercise - 11/26/17 0001      Knee/Hip Exercises: Stretches   Active Hamstring Stretch  Both;3 reps;30 seconds    Active Hamstring Stretch Limitations  Supine with strap      Knee/Hip Exercises: Supine   Bridges  Both;2 sets;10 reps;Strengthening;Limitations    Bridges Limitations  red theraband at knees        PT Education - 11/26/17 0940    Education Details  Educated on progress in therapy so far and on updated HEP.    Person(s) Educated  Patient    Methods  Explanation;Verbal cues;Handout    Comprehension  Verbalized understanding;Returned demonstration       PT Short Term Goals - 11/26/17 1248      PT SHORT TERM GOAL #1   Title  Patient will be independent with HEP, updated PRN, to improve/progress funcitonal ROM and strength to improve mobility.    Time  2    Period  Weeks    Status  On-going      PT SHORT TERM GOAL #2   Title  Patient will improve ROM for  bil tibiofemoral joints by 8 degrees to demonstrate significant improvement in ROM to improve performance with gait and functional mobility.    Baseline  improved for  bil LE however not by 8 degrees    Time  4    Period  Weeks    Status  On-going      PT SHORT TERM GOAL #3   Title  Patient will ambulate 100 feet with LRAD and safe gait pattern to demonstrate improve endurance and improved home/community access.    Time  4    Period  Weeks    Status  Achieved        PT Long Term Goals - 10/30/17 1031      PT LONG TERM GOAL #1   Title  Patient will improve ROM for bil tibiofemoral joints by 15 degrees to demonstrate significant improvement in ROM to improve performance with gait and functional mobility.    Time  8    Period  Weeks    Status  On-going      PT LONG TERM GOAL #2   Title  Patient will improve MMT by 1 grade for limited groups to demonstrate improve functional LE strength to improve performance with functional gait and mobility.    Time  8    Period  Weeks    Status  On-going      PT LONG TERM GOAL #3   Title  Patient will ambulate 150 feet at 0.8 m/s to demonstrate improved home and community access and improve independence with mobiltiy.     Time  8    Period  Weeks    Status  On-going      PT LONG TERM GOAL #4   Title  Patient will ascend/descend 4x 6 inch stairs with modified independence to improve safety with home access as he has stairs to enter.    Time  8    Period  Weeks    Status  On-going        Plan - 11/26/17 0940    Clinical Impression Statement  Re-assessment performed this session and patient has made some progress towards short and long-term goals. He has made improvements in bil LE strength with MMT and has increased ROM for bil knees. He continued to ambulate with bil axillary crutches and remains unsteady with slow gait velocity indicative of falling. He was provided additional exercises for HEP and instructed on proper form this session. He will continue to benefit from skilled PT interventions to address impairments and further progress mobility.    Rehab Potential  Fair    Clinical Impairments  Affecting Rehab Potential  (-) financial restrictions    PT Frequency  2x / week    PT Duration  8 weeks    PT Treatment/Interventions  ADLs/Self Care Home Management;Aquatic Therapy;Cryotherapy;Electrical Stimulation;Moist Heat;DME Instruction;Gait training;Stair training;Functional mobility training;Therapeutic activities;Therapeutic exercise;Balance training;Neuromuscular re-education;Patient/family education;Manual techniques;Passive range of motion;Taping    PT Next Visit Plan  Continue manual intervention to bil knee joints. Continue with supine strengthening, sit to stands. Perform gait training with RW.     PT Home Exercise Plan  Eval: seated hamstring stretch, quad set, sit<>stand, supine hamstring stretch, bridge    Consulted and Agree with Plan of Care  Patient       Patient will benefit from skilled therapeutic intervention in order to improve the following deficits and impairments:  Abnormal gait, Decreased balance, Decreased endurance, Decreased mobility, Difficulty walking, Hypomobility, Increased muscle spasms, Improper body mechanics, Decreased  range of motion, Decreased activity tolerance, Decreased strength, Decreased knowledge of use of DME, Increased fascial restricitons, Impaired flexibility, Pain  Visit Diagnosis: Chronic pain of left knee  Chronic pain of right knee  Other abnormalities of gait and mobility     Problem List Patient Active Problem List   Diagnosis Date Noted  . Primary osteoarthritis of both knees 01/07/2014  . Odynophagia 06/08/2013  . GERD (gastroesophageal reflux disease) 06/08/2013  . HYPERLIPIDEMIA 02/10/2010  . HYPERTENSION 03/24/2009  . ATHEROSCLEROTIC CARDIOVASCULAR DISEASE 03/24/2009  . DEGENERATIVE JOINT DISEASE 03/24/2009  . HYPERGLYCEMIA 03/24/2009    Valentino Saxon, PT, DPT Physical Therapist with [facility]  11/26/2017 12:58 PM    [facility] [address] [place], [place], [address] Phone: [phone]   Fax:  [phone]  Name: Glenn Brooks MRN: [id number] Date of Birth: 05/27/1929

## 2017-11-28 ENCOUNTER — Ambulatory Visit (HOSPITAL_COMMUNITY): Payer: Medicare HMO

## 2017-11-28 ENCOUNTER — Encounter (HOSPITAL_COMMUNITY): Payer: Self-pay

## 2017-11-28 DIAGNOSIS — R2689 Other abnormalities of gait and mobility: Secondary | ICD-10-CM

## 2017-11-28 DIAGNOSIS — M25562 Pain in left knee: Secondary | ICD-10-CM | POA: Diagnosis not present

## 2017-11-28 DIAGNOSIS — G8929 Other chronic pain: Secondary | ICD-10-CM

## 2017-11-28 DIAGNOSIS — M25561 Pain in right knee: Secondary | ICD-10-CM

## 2017-11-28 NOTE — Therapy (Signed)
[facility] [address] [place], [place], [address] Phone: [phone]   Fax:  [phone]  Physical Therapy Treatment  Patient Details  Name: Glenn Brooks MRN: [id number] Date of Birth: 03/24/1930 Referring Provider: Oval Linsey, MD   Encounter Date: 11/28/2017  PT End of Session - 11/28/17 0911    Visit Number  10    Number of Visits  17    Date for PT Re-Evaluation  12/27/17   Minireassess 11/25/17   Authorization Type  [employer] Medicare HMO (no auth required, no visit limit)    Authorization Time Period  10/28/17 - 12/27/17    Authorization - Visit Number  2    Authorization - Number of Visits  10    PT Start Time  0858    PT Stop Time  0948    PT Time Calculation (min)  50 min    Activity Tolerance  Patient tolerated treatment well;No increased pain    Behavior During Therapy  WFL for tasks assessed/performed       Past Medical History:  Diagnosis Date  . Arteriosclerotic cardiovascular disease (ASCVD) 1993   Acute MI->PCI in 1993; asymptomatic PVCs  . ASCVD (arteriosclerotic cardiovascular disease) 1993   Status post MI and percutaneous intervention  . Colonic polyp    excised via colonoscope  . Coronary artery disease   . Degenerative joint disease    shoulder  . ED (erectile dysfunction)    Secondary to medication  . Headache   . Hyperglycemia   . Hyperlipidemia   . Hypertension   . Myocardial infarction (HCC)   . Obesity   . Stroke [facility])    "mini stroke, Bell's palsy"   . Testosterone deficiency    Erectile dysfunction    Past Surgical History:  Procedure Laterality Date  . CATARACT EXTRACTION, BILATERAL    . COLONOSCOPY W/ POLYPECTOMY  04/12/2010   MWN:UUVOZD rectum/Pancolonic diverticula/Polyp at hepatic flexure and cecum, resected with snare technique as above  . KNEE ARTHROSCOPY     Arthroscopic surgery x 2  . SHOULDER SURGERY    . TRANSURETHRAL RESECTION OF BLADDER      There were no vitals  filed for this visit.  Subjective Assessment - 11/28/17 0906    Subjective  Pt stated he continues to have pain wiht weight bearing, pain scale 5/10 today,  Reports compliance wiht HEP daily.      Pertinent History  patient has difficulty providing history secondary to attention to questions    Patient Stated Goals  to have less knee pain and walk better    Currently in Pain?  Yes    Pain Score  5     Pain Location  Knee    Pain Orientation  Right;Left    Pain Descriptors / Indicators  Sore;Aching    Pain Type  Chronic pain    Pain Radiating Towards  right hurts more    Pain Onset  More than a month ago    Pain Frequency  Constant    Aggravating Factors   walking    Pain Relieving Factors  rest    Effect of Pain on Daily Activities  severe                       [facility] Adult PT Treatment/Exercise - 11/28/17 0001      Ambulation/Gait   Ambulation Distance (Feet)  100 Feet    Assistive device  Rolling walker  Gait Pattern  Step-to pattern;Decreased step length - left;Decreased step length - right;Decreased stride length;Right flexed knee in stance;Left flexed knee in stance;Shuffle;Trunk flexed;Poor foot clearance - left;Poor foot clearance - right      Knee/Hip Exercises: Stretches   Active Hamstring Stretch  Both;3 reps;30 seconds    Active Hamstring Stretch Limitations  Supine with strap      Knee/Hip Exercises: Seated   Long Arc Quad  Both;15 reps    Long Arc Quad Limitations  3-5" holds; 3#    Sit to [employer]  10 reps;without UE support      Knee/Hip Exercises: Supine   Short Arc [employer]  Both;2 sets;10 reps    Short Arc [employer] Limitations  3-5" holds    Bridges  Both;2 sets;10 reps;Strengthening;Limitations    Bridges Limitations  red theraband at knees      Manual Therapy   Manual Therapy  Joint mobilization;Passive ROM    Manual therapy comments  manual completed seperate from other interventions    Joint Mobilization  3x 30-45 seconds grade III  oscillations for AP glide to bil tibiofemoral joint to improve flexion; 3x 30-45 seconds grade III oscillations for PA glide to bil tibiofemoral joint to improve extension    Passive ROM  3x 10 reps (between joint mobilization) bil tibfem joints               PT Short Term Goals - 11/26/17 1248      PT SHORT TERM GOAL #1   Title  Patient will be independent with HEP, updated PRN, to improve/progress funcitonal ROM and strength to improve mobility.    Time  2    Period  Weeks    Status  On-going      PT SHORT TERM GOAL #2   Title  Patient will improve ROM for bil tibiofemoral joints by 8 degrees to demonstrate significant improvement in ROM to improve performance with gait and functional mobility.    Baseline  improved for bil LE however not by 8 degrees    Time  4    Period  Weeks    Status  On-going      PT SHORT TERM GOAL #3   Title  Patient will ambulate 100 feet with LRAD and safe gait pattern to demonstrate improve endurance and improved home/community access.    Time  4    Period  Weeks    Status  Achieved        PT Long Term Goals - 10/30/17 1031      PT LONG TERM GOAL #1   Title  Patient will improve ROM for bil tibiofemoral joints by 15 degrees to demonstrate significant improvement in ROM to improve performance with gait and functional mobility.    Time  8    Period  Weeks    Status  On-going      PT LONG TERM GOAL #2   Title  Patient will improve MMT by 1 grade for limited groups to demonstrate improve functional LE strength to improve performance with functional gait and mobility.    Time  8    Period  Weeks    Status  On-going      PT LONG TERM GOAL #3   Title  Patient will ambulate 150 feet at 0.8 m/s to demonstrate improved home and community access and improve independence with mobiltiy.     Time  8    Period  Weeks    Status  On-going  PT LONG TERM GOAL #4   Title  Patient will ascend/descend 4x 6 inch stairs with modified independence to  improve safety with home access as he has stairs to enter.    Time  8    Period  Weeks    Status  On-going            Plan - 11/28/17 2548    Clinical Impression Statement  Began session with gait training with RW, pt stated he prefers use of axillary crutches as can take some weight off LE for pain control.  Continued session focus with proximal strengthening and knee mobility.  Manual soft tissue mobilizaiton and joint mobs complete for Bil knee to improve mobility.  Pt did c/o knee locking 3 episodes during therex today.  No reports of increased pain at EOS.    Rehab Potential  Fair    Clinical Impairments Affecting Rehab Potential  (-) financial restrictions    PT Frequency  2x / week    PT Duration  8 weeks    PT Treatment/Interventions  ADLs/Self Care Home Management;Aquatic Therapy;Cryotherapy;Electrical Stimulation;Moist Heat;DME Instruction;Gait training;Stair training;Functional mobility training;Therapeutic activities;Therapeutic exercise;Balance training;Neuromuscular re-education;Patient/family education;Manual techniques;Passive range of motion;Taping    PT Next Visit Plan  Continue manual intervention to bil knee joints. Continue with supine strengthening, sit to stands.     PT Home Exercise Plan  Eval: seated hamstring stretch, quad set, sit<>stand, supine hamstring stretch, bridge       Patient will benefit from skilled therapeutic intervention in order to improve the following deficits and impairments:  Abnormal gait, Decreased balance, Decreased endurance, Decreased mobility, Difficulty walking, Hypomobility, Increased muscle spasms, Improper body mechanics, Decreased range of motion, Decreased activity tolerance, Decreased strength, Decreased knowledge of use of DME, Increased fascial restricitons, Impaired flexibility, Pain  Visit Diagnosis: Chronic pain of left knee  Chronic pain of right knee  Other abnormalities of gait and mobility     Problem  List Patient Active Problem List   Diagnosis Date Noted  . Primary osteoarthritis of both knees 01/07/2014  . Odynophagia 06/08/2013  . GERD (gastroesophageal reflux disease) 06/08/2013  . HYPERLIPIDEMIA 02/10/2010  . HYPERTENSION 03/24/2009  . ATHEROSCLEROTIC CARDIOVASCULAR DISEASE 03/24/2009  . DEGENERATIVE JOINT DISEASE 03/24/2009  . HYPERGLYCEMIA 03/24/2009   Becky Sax, LPTA; CBIS [phone]  Juel Burrow 11/28/2017, 10:14 AM  [facility] [address] [place], [place], [address] Phone: [phone]   Fax:  [phone]  Name: Glenn Brooks MRN: [id number] Date of Birth: 1929/05/28

## 2017-12-03 ENCOUNTER — Encounter (HOSPITAL_COMMUNITY): Payer: Self-pay

## 2017-12-03 ENCOUNTER — Telehealth (HOSPITAL_COMMUNITY): Payer: Self-pay | Admitting: Family Medicine

## 2017-12-03 ENCOUNTER — Ambulatory Visit (HOSPITAL_COMMUNITY): Payer: Medicare HMO

## 2017-12-03 ENCOUNTER — Other Ambulatory Visit: Payer: Self-pay

## 2017-12-03 DIAGNOSIS — R2689 Other abnormalities of gait and mobility: Secondary | ICD-10-CM

## 2017-12-03 DIAGNOSIS — M25561 Pain in right knee: Secondary | ICD-10-CM

## 2017-12-03 DIAGNOSIS — M25562 Pain in left knee: Secondary | ICD-10-CM | POA: Diagnosis not present

## 2017-12-03 DIAGNOSIS — G8929 Other chronic pain: Secondary | ICD-10-CM

## 2017-12-03 NOTE — Therapy (Signed)
[facility] [address] [place], [place], [address] Phone: [phone]   Fax:  [phone]  Physical Therapy Treatment  Patient Details  Name: Glenn Brooks MRN: [id number] Date of Birth: 20-Oct-1929 Referring Provider: Oval Linsey, MD   Encounter Date: 12/03/2017  PT End of Session - 12/03/17 0850    Visit Number  11    Number of Visits  17    Date for PT Re-Evaluation  12/27/17   Minireassess 11/25/17   Authorization Type  [employer] Medicare HMO (no auth required, no visit limit)    Authorization Time Period  10/28/17 - 12/27/17    Authorization - Visit Number  3    Authorization - Number of Visits  10    PT Start Time  0817    PT Stop Time  0906    PT Time Calculation (min)  49 min    Activity Tolerance  Patient tolerated treatment well;No increased pain    Behavior During Therapy  WFL for tasks assessed/performed       Past Medical History:  Diagnosis Date  . Arteriosclerotic cardiovascular disease (ASCVD) 1993   Acute MI->PCI in 1993; asymptomatic PVCs  . ASCVD (arteriosclerotic cardiovascular disease) 1993   Status post MI and percutaneous intervention  . Colonic polyp    excised via colonoscope  . Coronary artery disease   . Degenerative joint disease    shoulder  . ED (erectile dysfunction)    Secondary to medication  . Headache   . Hyperglycemia   . Hyperlipidemia   . Hypertension   . Myocardial infarction (HCC)   . Obesity   . Stroke [facility])    "mini stroke, Bell's palsy"   . Testosterone deficiency    Erectile dysfunction    Past Surgical History:  Procedure Laterality Date  . CATARACT EXTRACTION, BILATERAL    . COLONOSCOPY W/ POLYPECTOMY  04/12/2010   ONG:EXBMWU rectum/Pancolonic diverticula/Polyp at hepatic flexure and cecum, resected with snare technique as above  . KNEE ARTHROSCOPY     Arthroscopic surgery x 2  . SHOULDER SURGERY    . TRANSURETHRAL RESECTION OF BLADDER      There were no vitals  filed for this visit.  Subjective Assessment - 12/03/17 0842    Subjective  Patient wants to know if a knee brace will help him walk and stop him from falling. He also reports he was denied knee replacements and is unsure of what will help him now with his pain and walking. He feels he has gotten better but still has great deal of pain.    Pertinent History  patient has difficulty providing history secondary to attention to questions    Limitations  Standing;Walking;House hold activities    How long can you sit comfortably?  not limited    How long can you stand comfortably?  very limited    How long can you walk comfortably?  very limited (household distances are difficult)    Patient Stated Goals  to have less knee pain and walk better    Currently in Pain?  Yes    Pain Score  5     Pain Location  Knee    Pain Orientation  Left;Right    Pain Descriptors / Indicators  Aching;Sore;Constant    Pain Type  Chronic pain    Pain Radiating Towards  Rt>Lt    Pain Onset  More than a month ago    Pain Frequency  Constant    Aggravating Factors  walking, standing    Pain Relieving Factors  rest    Effect of Pain on Daily Activities  severe, sometimes Rt knee locks and he cannot move it       [facility] Adult PT Treatment/Exercise - 12/03/17 0001      Exercises   Exercises  Knee/Hip      Knee/Hip Exercises: Supine   Quad Sets  Both;1 set;15 reps    Quad Sets Limitations  TKE into ball; 5 second holds    Short Arc [employer]  Both;2 sets;10 reps    Short Arc [employer] Limitations  5 seconds    Bridges  Both;2 sets;Strengthening;Limitations;15 reps      Knee/Hip Exercises: Sidelying   Clams  2x 15 reps bil LE, no band      Manual Therapy   Manual Therapy  Joint mobilization;Passive ROM    Manual therapy comments  manual completed seperate from other interventions    Joint Mobilization  3x 30-45 seconds grade III oscillations for AP glide to bil tibiofemoral joint to improve flexion; 3x 30-45  seconds grade III oscillations for PA glide to bil tibiofemoral joint to improve extension    Passive ROM  3x 10 reps (between joint mobilization) bil tibfem joints        PT Education - 12/03/17 0849    Education Details  Extensive time spent at start of session and during treatment interventions about patient's progress with knee mobility and about conservative versus surgical interventions. Patient had difficulty recalling past interventions to his knees and repeatedly stated he was denied from having surgery. During discussion patient explained he was a candidate and was scheduled for surgery but hten could not make the appointment and no one called him to reschedule the appointment. I informed him I will call his PCP and Dr. Samson Frederic who was previosuly giong to do the surgery ~ 9 months ago.    Person(s) Educated  Patient    Methods  Explanation    Comprehension  Verbalized understanding       PT Short Term Goals - 11/26/17 1248      PT SHORT TERM GOAL #1   Title  Patient will be independent with HEP, updated PRN, to improve/progress funcitonal ROM and strength to improve mobility.    Time  2    Period  Weeks    Status  On-going      PT SHORT TERM GOAL #2   Title  Patient will improve ROM for bil tibiofemoral joints by 8 degrees to demonstrate significant improvement in ROM to improve performance with gait and functional mobility.    Baseline  improved for bil LE however not by 8 degrees    Time  4    Period  Weeks    Status  On-going      PT SHORT TERM GOAL #3   Title  Patient will ambulate 100 feet with LRAD and safe gait pattern to demonstrate improve endurance and improved home/community access.    Time  4    Period  Weeks    Status  Achieved        PT Long Term Goals - 10/30/17 1031      PT LONG TERM GOAL #1   Title  Patient will improve ROM for bil tibiofemoral joints by 15 degrees to demonstrate significant improvement in ROM to improve performance with gait  and functional mobility.    Time  8    Period  Weeks    Status  On-going      PT LONG TERM GOAL #2   Title  Patient will improve MMT by 1 grade for limited groups to demonstrate improve functional LE strength to improve performance with functional gait and mobility.    Time  8    Period  Weeks    Status  On-going      PT LONG TERM GOAL #3   Title  Patient will ambulate 150 feet at 0.8 m/s to demonstrate improved home and community access and improve independence with mobiltiy.     Time  8    Period  Weeks    Status  On-going      PT LONG TERM GOAL #4   Title  Patient will ascend/descend 4x 6 inch stairs with modified independence to improve safety with home access as he has stairs to enter.    Time  8    Period  Weeks    Status  On-going        Plan - 12/03/17 0849    Clinical Impression Statement  Continued with supine and side-lying hip strengthening and quad strengthening. He performed TKE with soft ball behind the knee today on Bil LE's and had some pain on Rt knee with catching/locking when trying to flex it after performing this exercise. Manual joint mobilization continued for pain relief and patient reported positive response to this. Tiem was spent today discussing patient's options for treatment as he has made improvements in strength and ROM bilaterally however is still greatly limited by pain. Upon chart review and extensive questioning it was determined patient was deemed a surgical candidate last December. He missed his appointment and was never rescheduled. I informed him I will call his PCP and the orthopedic surgeon he had a consultation with last year as I believe he would benefit from a follow up to consider if this is an appropriate intervention at this time.     Rehab Potential  Fair    Clinical Impairments Affecting Rehab Potential  (-) financial restrictions    PT Frequency  2x / week    PT Duration  8 weeks    PT Treatment/Interventions  ADLs/Self Care Home  Management;Aquatic Therapy;Cryotherapy;Electrical Stimulation;Moist Heat;DME Instruction;Gait training;Stair training;Functional mobility training;Therapeutic activities;Therapeutic exercise;Balance training;Neuromuscular re-education;Patient/family education;Manual techniques;Passive range of motion;Taping    PT Next Visit Plan  Continue manual intervention to bil knee joints. Continue with supine strengthening, sit to stands. Add contract relax stretch for hamstring (gentle) and follow up with patient about PCP and Orthopedic surgeons response regarding consideration of TKA.    PT Home Exercise Plan  Eval: seated hamstring stretch, quad set, sit<>stand, supine hamstring stretch, bridge    Consulted and Agree with Plan of Care  Patient       Patient will benefit from skilled therapeutic intervention in order to improve the following deficits and impairments:  Abnormal gait, Decreased balance, Decreased endurance, Decreased mobility, Difficulty walking, Hypomobility, Increased muscle spasms, Improper body mechanics, Decreased range of motion, Decreased activity tolerance, Decreased strength, Decreased knowledge of use of DME, Increased fascial restricitons, Impaired flexibility, Pain  Visit Diagnosis: Chronic pain of left knee  Chronic pain of right knee  Other abnormalities of gait and mobility     Problem List Patient Active Problem List   Diagnosis Date Noted  . Primary osteoarthritis of both knees 01/07/2014  . Odynophagia 06/08/2013  . GERD (gastroesophageal reflux disease) 06/08/2013  . HYPERLIPIDEMIA 02/10/2010  . HYPERTENSION 03/24/2009  . ATHEROSCLEROTIC CARDIOVASCULAR DISEASE 03/24/2009  .  DEGENERATIVE JOINT DISEASE 03/24/2009  . HYPERGLYCEMIA 03/24/2009    Valentino Saxon, PT, DPT Physical Therapist with [facility]  12/03/2017 8:50 AM     [facility] [address] [place], [place], [address] Phone:  [phone]   Fax:  [phone]  Name: JASAIAH KARWOWSKI MRN: [id number] Date of Birth: March 22, 1930

## 2017-12-05 ENCOUNTER — Ambulatory Visit (HOSPITAL_COMMUNITY): Payer: Medicare HMO

## 2017-12-05 ENCOUNTER — Encounter (HOSPITAL_COMMUNITY): Payer: Self-pay

## 2017-12-05 ENCOUNTER — Other Ambulatory Visit: Payer: Self-pay

## 2017-12-05 DIAGNOSIS — G8929 Other chronic pain: Secondary | ICD-10-CM

## 2017-12-05 DIAGNOSIS — R2689 Other abnormalities of gait and mobility: Secondary | ICD-10-CM

## 2017-12-05 DIAGNOSIS — M25562 Pain in left knee: Secondary | ICD-10-CM | POA: Diagnosis not present

## 2017-12-05 DIAGNOSIS — M25561 Pain in right knee: Secondary | ICD-10-CM

## 2017-12-05 NOTE — Therapy (Signed)
[facility] [address] [place], [place], [address] Phone: [phone]   Fax:  [phone]  Physical Therapy Treatment  Patient Details  Name: Glenn Brooks MRN: [id number] Date of Birth: 1930/03/26 Referring Provider: Oval Linseyondiego, Richard, MD   Encounter Date: 12/05/2017  PT End of Session - 12/05/17 0843    Visit Number  13    Number of Visits  17    Date for PT Re-Evaluation  12/27/17   Minireassess 11/25/17   Authorization Type  [employer] Medicare HMO (no auth required, no visit limit)    Authorization Time Period  10/28/17 - 12/27/17    Authorization - Visit Number  4    Authorization - Number of Visits  10    PT Start Time  0819    PT Stop Time  0904    PT Time Calculation (min)  45 min    Activity Tolerance  Patient tolerated treatment well;No increased pain    Behavior During Therapy  WFL for tasks assessed/performed       Past Medical History:  Diagnosis Date  . Arteriosclerotic cardiovascular disease (ASCVD) 1993   Acute MI->PCI in 1993; asymptomatic PVCs  . ASCVD (arteriosclerotic cardiovascular disease) 1993   Status post MI and percutaneous intervention  . Colonic polyp    excised via colonoscope  . Coronary artery disease   . Degenerative joint disease    shoulder  . ED (erectile dysfunction)    Secondary to medication  . Headache   . Hyperglycemia   . Hyperlipidemia   . Hypertension   . Myocardial infarction (HCC)   . Obesity   . Stroke [facility](HCC)    "mini stroke, Bell's palsy"   . Testosterone deficiency    Erectile dysfunction    Past Surgical History:  Procedure Laterality Date  . CATARACT EXTRACTION, BILATERAL    . COLONOSCOPY W/ POLYPECTOMY  04/12/2010   ONG:EXBMWURMR:Normal rectum/Pancolonic diverticula/Polyp at hepatic flexure and cecum, resected with snare technique as above  . KNEE ARTHROSCOPY     Arthroscopic surgery x 2  . SHOULDER SURGERY    . TRANSURETHRAL RESECTION OF BLADDER      There were no vitals  filed for this visit.  Subjective Assessment - 12/05/17 0833    Subjective  Patient wants to know if a knee brace will help him walk and stop him from falling. He also reports he was denied knee replacements and is unsure of what will help him now with his pain and walking. He feels he has gotten better but still has great deal of pain.    Pertinent History  patient has difficulty providing history secondary to attention to questions    Limitations  Standing;Walking;House hold activities    How long can you sit comfortably?  not limited    How long can you stand comfortably?  very limited    How long can you walk comfortably?  very limited (household distances are difficult)    Patient Stated Goals  to have less knee pain and walk better    Currently in Pain?  Yes    Pain Score  5     Pain Location  Knee    Pain Orientation  Right;Left    Pain Descriptors / Indicators  Aching;Sore;Constant    Pain Type  Chronic pain    Pain Onset  More than a month ago    Pain Frequency  Constant    Aggravating Factors   walking, standing    Pain  Relieving Factors  rest, cream        AROM - 12/05/17 0001  Left Knee Flexion 116  Left Knee Extension 10  Right Knee Extension 21  Right Knee Flexion 114    [facility] Adult PT Treatment/Exercise - 12/05/17 0001      Exercises   Exercises  Knee/Hip      Knee/Hip Exercises: Stretches   Active Hamstring Stretch  Both;3 reps;30 seconds    Active Hamstring Stretch Limitations  Supine with strap      Knee/Hip Exercises: Supine   Quad Sets  Both;15 reps;2 sets    Quad Sets Limitations  TKE into ball, 5 second holds    Heel Slides  AROM;Both;1 set;Limitations;15 reps    Heel Slides Limitations  5" holds, min A to help get RLE started       PT Education - 12/05/17 0843    Education Details  Time spent again this session discussing prognosis an plan for therapy. Educated on options for orthopedic surgical consultation, and that the patient does not have to  return to the office he went to last year if he would prefer to find another physician. I edcuated him on his continued improvement but that it will be limited by his pain and excessive Rt knee valgus. I educated him that we can contact his PCP for a referral to another orthopedic surgeon if he would prefer not to go to [facility].     Person(s) Educated  Patient    Methods  Explanation    Comprehension  Verbalized understanding       PT Short Term Goals - 11/26/17 1248      PT SHORT TERM GOAL #1   Title  Patient will be independent with HEP, updated PRN, to improve/progress funcitonal ROM and strength to improve mobility.    Time  2    Period  Weeks    Status  On-going      PT SHORT TERM GOAL #2   Title  Patient will improve ROM for bil tibiofemoral joints by 8 degrees to demonstrate significant improvement in ROM to improve performance with gait and functional mobility.    Baseline  improved for bil LE however not by 8 degrees    Time  4    Period  Weeks    Status  On-going      PT SHORT TERM GOAL #3   Title  Patient will ambulate 100 feet with LRAD and safe gait pattern to demonstrate improve endurance and improved home/community access.    Time  4    Period  Weeks    Status  Achieved        PT Long Term Goals - 10/30/17 1031      PT LONG TERM GOAL #1   Title  Patient will improve ROM for bil tibiofemoral joints by 15 degrees to demonstrate significant improvement in ROM to improve performance with gait and functional mobility.    Time  8    Period  Weeks    Status  On-going      PT LONG TERM GOAL #2   Title  Patient will improve MMT by 1 grade for limited groups to demonstrate improve functional LE strength to improve performance with functional gait and mobility.    Time  8    Period  Weeks    Status  On-going      PT LONG TERM GOAL #3   Title  Patient will ambulate 150 feet at 0.8  m/s to demonstrate improved home and community access and improve  independence with mobiltiy.     Time  8    Period  Weeks    Status  On-going      PT LONG TERM GOAL #4   Title  Patient will ascend/descend 4x 6 inch stairs with modified independence to improve safety with home access as he has stairs to enter.    Time  8    Period  Weeks    Status  On-going         Plan - 12/05/17 0843    Clinical Impression Statement  Session continues to include discussion on prognosis with Rt knee osteoarthritis and improvements so far with conservative treatment. Extensive time spent discussing past year of visits and testing with orthopedic surgeon ~ 9 months ago for Rt TKA and on options to see another orthopedic doctor as well as continue conservatively if the patient is no longer interested in undergoing a Rt TKA. Patient continues to improve in ROM and today Rt knee AROM is 21-114 and Lt knee AROM is 10-116. Exercises continue to focus on improving mobility with emphasis on knee extension. He will continue to benefit from skilled PT interventions to address impairments and further educate on options to manage bil knee osteoarthritis.     Rehab Potential  Fair    Clinical Impairments Affecting Rehab Potential  (-) financial restrictions    PT Frequency  2x / week    PT Duration  8 weeks    PT Treatment/Interventions  ADLs/Self Care Home Management;Aquatic Therapy;Cryotherapy;Electrical Stimulation;Moist Heat;DME Instruction;Gait training;Stair training;Functional mobility training;Therapeutic activities;Therapeutic exercise;Balance training;Neuromuscular re-education;Patient/family education;Manual techniques;Passive range of motion;Taping    PT Next Visit Plan  Continue manual intervention to bil knee joints. Continue with supine strengthening, sit to stands. Add contract relax stretch for hamstring (gentle). Continue with discussion regarding consideration of TKA and if he would like [place] to ask his PCP for a new referral or will go to [facility].     PT  Home Exercise Plan  Eval: seated hamstring stretch, quad set, sit<>stand, supine hamstring stretch, bridge    Consulted and Agree with Plan of Care  Patient       Patient will benefit from skilled therapeutic intervention in order to improve the following deficits and impairments:  Abnormal gait, Decreased balance, Decreased endurance, Decreased mobility, Difficulty walking, Hypomobility, Increased muscle spasms, Improper body mechanics, Decreased range of motion, Decreased activity tolerance, Decreased strength, Decreased knowledge of use of DME, Increased fascial restricitons, Impaired flexibility, Pain  Visit Diagnosis: Chronic pain of left knee  Chronic pain of right knee  Other abnormalities of gait and mobility     Problem List Patient Active Problem List   Diagnosis Date Noted  . Primary osteoarthritis of both knees 01/07/2014  . Odynophagia 06/08/2013  . GERD (gastroesophageal reflux disease) 06/08/2013  . HYPERLIPIDEMIA 02/10/2010  . HYPERTENSION 03/24/2009  . ATHEROSCLEROTIC CARDIOVASCULAR DISEASE 03/24/2009  . DEGENERATIVE JOINT DISEASE 03/24/2009  . HYPERGLYCEMIA 03/24/2009    Valentino Saxon, PT, DPT Physical Therapist with [facility]  12/05/2017 9:07 AM     [facility] [address] [place], [place], [address] Phone: [phone]   Fax:  [phone]  Name: Glenn Brooks MRN: [id number] Date of Birth: 04/07/1929

## 2017-12-10 ENCOUNTER — Ambulatory Visit (HOSPITAL_COMMUNITY): Payer: Medicare HMO

## 2017-12-10 ENCOUNTER — Other Ambulatory Visit: Payer: Self-pay

## 2017-12-10 ENCOUNTER — Encounter (HOSPITAL_COMMUNITY): Payer: Self-pay

## 2017-12-10 DIAGNOSIS — M25562 Pain in left knee: Secondary | ICD-10-CM | POA: Diagnosis not present

## 2017-12-10 DIAGNOSIS — R2689 Other abnormalities of gait and mobility: Secondary | ICD-10-CM

## 2017-12-10 DIAGNOSIS — M25561 Pain in right knee: Secondary | ICD-10-CM

## 2017-12-10 DIAGNOSIS — G8929 Other chronic pain: Secondary | ICD-10-CM

## 2017-12-10 NOTE — Therapy (Signed)
[facility] [address] [place], [place], [address] Phone: [phone]   Fax:  [phone]  Physical Therapy Treatment  Patient Details  Name: Glenn EstimableClyde M Bara MRN: [id number] Date of Birth: 12/17/1929 Referring Provider: Oval Linseyondiego, Richard, MD   Encounter Date: 12/10/2017  PT End of Session - 12/10/17 0829    Visit Number  13    Number of Visits  17    Date for PT Re-Evaluation  12/27/17   Minireassess 11/25/17   Authorization Type  [employer] Medicare HMO (no auth required, no visit limit)    Authorization Time Period  10/28/17 - 12/27/17    Authorization - Visit Number  5    Authorization - Number of Visits  10    PT Start Time  0822    PT Stop Time  0900    PT Time Calculation (min)  38 min    Activity Tolerance  Patient tolerated treatment well;No increased pain    Behavior During Therapy  WFL for tasks assessed/performed       Past Medical History:  Diagnosis Date  . Arteriosclerotic cardiovascular disease (ASCVD) 1993   Acute MI->PCI in 1993; asymptomatic PVCs  . ASCVD (arteriosclerotic cardiovascular disease) 1993   Status post MI and percutaneous intervention  . Colonic polyp    excised via colonoscope  . Coronary artery disease   . Degenerative joint disease    shoulder  . ED (erectile dysfunction)    Secondary to medication  . Headache   . Hyperglycemia   . Hyperlipidemia   . Hypertension   . Myocardial infarction (HCC)   . Obesity   . Stroke [facility](HCC)    "mini stroke, Bell's palsy"   . Testosterone deficiency    Erectile dysfunction    Past Surgical History:  Procedure Laterality Date  . CATARACT EXTRACTION, BILATERAL    . COLONOSCOPY W/ POLYPECTOMY  04/12/2010   ONG:EXBMWURMR:Normal rectum/Pancolonic diverticula/Polyp at hepatic flexure and cecum, resected with snare technique as above  . KNEE ARTHROSCOPY     Arthroscopic surgery x 2  . SHOULDER SURGERY    . TRANSURETHRAL RESECTION OF BLADDER      There were no vitals  filed for this visit.  Subjective Assessment - 12/10/17 0827    Subjective  Patient reports he is in "agony" with pain and following that states "it's not really too bad". He reports he is still doing his exercises.     Pertinent History  patient has difficulty providing history secondary to attention to questions    Limitations  Standing;Walking;House hold activities    How long can you sit comfortably?  not limited    How long can you stand comfortably?  very limited    How long can you walk comfortably?  very limited (household distances are difficult)    Patient Stated Goals  to have less knee pain and walk better    Currently in Pain?  Yes    Pain Score  6     Pain Location  Knee    Pain Orientation  Right;Left    Pain Descriptors / Indicators  Aching;Sore;Constant    Pain Type  Chronic pain    Pain Radiating Towards  Rt>Lt    Pain Onset  More than a month ago    Aggravating Factors   walking    Pain Relieving Factors  rest    Effect of Pain on Daily Activities  severe limitation        [facility] Adult PT  Treatment/Exercise - 12/10/17 0001      Exercises   Exercises  Knee/Hip      Knee/Hip Exercises: Seated   Sit to Sand  10 reps;without UE support      Knee/Hip Exercises: Supine   Quad Sets  Both;15 reps;1 set    Quad Sets Limitations  TKE into ball, 5 second holds    Heel Slides  AROM;Both;1 set;Limitations;15 reps    Heel Slides Limitations  5" holds, min A to help get RLE started    Bridges  Both;2 sets;Strengthening;Limitations;15 reps    Bridges Limitations  3 sec holds    Other Supine Knee/Hip Exercises  Contract/relax stretch: bil LE, 3x 5 second contract/30 seconds relax for hamstring stretch      Manual Therapy   Manual Therapy  Joint mobilization;Passive ROM    Manual therapy comments  manual completed seperate from other interventions    Joint Mobilization  3x 30-45 seconds grade III oscillations for AP glide to bil tibiofemoral joint to improve flexion     Passive ROM  3x 10 reps (between joint mobilization) bil tibfem joints       PT Education - 12/10/17 0927    Education Details  educated on importance of continuing to use crutches for safety while ambulating and discussed likely outcome with PT of some improved mobility and stretngth, as well as improvement in pain but not complete resolution of pain whille WB.    Person(s) Educated  Patient    Methods  Explanation    Comprehension  Verbalized understanding       PT Short Term Goals - 11/26/17 1248      PT SHORT TERM GOAL #1   Title  Patient will be independent with HEP, updated PRN, to improve/progress funcitonal ROM and strength to improve mobility.    Time  2    Period  Weeks    Status  On-going      PT SHORT TERM GOAL #2   Title  Patient will improve ROM for bil tibiofemoral joints by 8 degrees to demonstrate significant improvement in ROM to improve performance with gait and functional mobility.    Baseline  improved for bil LE however not by 8 degrees    Time  4    Period  Weeks    Status  On-going      PT SHORT TERM GOAL #3   Title  Patient will ambulate 100 feet with LRAD and safe gait pattern to demonstrate improve endurance and improved home/community access.    Time  4    Period  Weeks    Status  Achieved        PT Long Term Goals - 10/30/17 1031      PT LONG TERM GOAL #1   Title  Patient will improve ROM for bil tibiofemoral joints by 15 degrees to demonstrate significant improvement in ROM to improve performance with gait and functional mobility.    Time  8    Period  Weeks    Status  On-going      PT LONG TERM GOAL #2   Title  Patient will improve MMT by 1 grade for limited groups to demonstrate improve functional LE strength to improve performance with functional gait and mobility.    Time  8    Period  Weeks    Status  On-going      PT LONG TERM GOAL #3   Title  Patient will ambulate 150 feet at 0.8 m/s to  demonstrate improved home and community  access and improve independence with mobiltiy.     Time  8    Period  Weeks    Status  On-going      PT LONG TERM GOAL #4   Title  Patient will ascend/descend 4x 6 inch stairs with modified independence to improve safety with home access as he has stairs to enter.    Time  8    Period  Weeks    Status  On-going       Plan - 12/10/17 1610    Clinical Impression Statement  Continued with focus on supine and seated strengthening. Patient progressed with sit to stands today and did not require UE assist to perform. Contract relax stretch was added for hamstring stretching and patient denied increased pain with this exercise. He was educated on need to continue using crutches for ambulation or RW if he becomes comfortable with this. Discussed again his options to follow-up with orthopedic surgeon and patient stated he has decided to "let it be" and does not want to undergo surgery even if he is a candidate. He will continue to benefit from skilled PT interventions to address impairments and further educate on options to manage bil knee osteoarthritis.    Rehab Potential  Fair    Clinical Impairments Affecting Rehab Potential  (-) financial restrictions    PT Frequency  2x / week    PT Duration  8 weeks    PT Treatment/Interventions  ADLs/Self Care Home Management;Aquatic Therapy;Cryotherapy;Electrical Stimulation;Moist Heat;DME Instruction;Gait training;Stair training;Functional mobility training;Therapeutic activities;Therapeutic exercise;Balance training;Neuromuscular re-education;Patient/family education;Manual techniques;Passive range of motion;Taping    PT Next Visit Plan  Continue manual intervention to bil knee joints. Continue with supine strengthening and stretching including contract relax. Perform sit to stands. Add heel/toe raises.    PT Home Exercise Plan  Eval: seated hamstring stretch, quad set, sit<>stand, supine hamstring stretch, bridge    Consulted and Agree with Plan of Care   Patient       Patient will benefit from skilled therapeutic intervention in order to improve the following deficits and impairments:  Abnormal gait, Decreased balance, Decreased endurance, Decreased mobility, Difficulty walking, Hypomobility, Increased muscle spasms, Improper body mechanics, Decreased range of motion, Decreased activity tolerance, Decreased strength, Decreased knowledge of use of DME, Increased fascial restricitons, Impaired flexibility, Pain  Visit Diagnosis: Chronic pain of left knee  Chronic pain of right knee  Other abnormalities of gait and mobility     Problem List Patient Active Problem List   Diagnosis Date Noted  . Primary osteoarthritis of both knees 01/07/2014  . Odynophagia 06/08/2013  . GERD (gastroesophageal reflux disease) 06/08/2013  . HYPERLIPIDEMIA 02/10/2010  . HYPERTENSION 03/24/2009  . ATHEROSCLEROTIC CARDIOVASCULAR DISEASE 03/24/2009  . DEGENERATIVE JOINT DISEASE 03/24/2009  . HYPERGLYCEMIA 03/24/2009    Valentino Saxon, PT, DPT Physical Therapist with [facility]  12/10/2017 9:28 AM    [place] [facility] [address] [place], [place], [address] Phone: [phone]   Fax:  [phone]  Name: KAESEN RODRIGUEZ MRN: [id number] Date of Birth: [birth year]

## 2017-12-12 ENCOUNTER — Ambulatory Visit (HOSPITAL_COMMUNITY): Payer: Medicare HMO

## 2017-12-12 ENCOUNTER — Encounter (HOSPITAL_COMMUNITY): Payer: Self-pay

## 2017-12-12 ENCOUNTER — Other Ambulatory Visit: Payer: Self-pay

## 2017-12-12 DIAGNOSIS — M25562 Pain in left knee: Principal | ICD-10-CM

## 2017-12-12 DIAGNOSIS — M25561 Pain in right knee: Secondary | ICD-10-CM

## 2017-12-12 DIAGNOSIS — R2689 Other abnormalities of gait and mobility: Secondary | ICD-10-CM

## 2017-12-12 DIAGNOSIS — G8929 Other chronic pain: Secondary | ICD-10-CM

## 2017-12-12 NOTE — Therapy (Signed)
[facility] [address] [place], [place], [address] Phone: [phone]   Fax:  [phone]  Physical Therapy Treatment  Patient Details  Name: Glenn Brooks MRN: [id number] Date of Birth: [birth year] Referring Provider: Oval Linsey, MD   Encounter Date: 12/12/2017  PT End of Session - 12/12/17 0828    Visit Number  14    Number of Visits  17    Date for PT Re-Evaluation  12/27/17   Minireassess 11/25/17   Authorization Type  [employer] Medicare HMO (no auth required, no visit limit)    Authorization Time Period  10/28/17 - 12/27/17    Authorization - Visit Number  6    Authorization - Number of Visits  10    PT Start Time  [phone]   patient arrived late   PT Stop Time  0904    PT Time Calculation (min)  40 min    Activity Tolerance  Patient tolerated treatment well;No increased pain    Behavior During Therapy  WFL for tasks assessed/performed       Past Medical History:  Diagnosis Date  . Arteriosclerotic cardiovascular disease (ASCVD) 1993   Acute MI->PCI in 1993; asymptomatic PVCs  . ASCVD (arteriosclerotic cardiovascular disease) 1993   Status post MI and percutaneous intervention  . Colonic polyp    excised via colonoscope  . Coronary artery disease   . Degenerative joint disease    shoulder  . ED (erectile dysfunction)    Secondary to medication  . Headache   . Hyperglycemia   . Hyperlipidemia   . Hypertension   . Myocardial infarction (HCC)   . Obesity   . Stroke [facility])    "mini stroke, Bell's palsy"   . Testosterone deficiency    Erectile dysfunction    Past Surgical History:  Procedure Laterality Date  . CATARACT EXTRACTION, BILATERAL    . COLONOSCOPY W/ POLYPECTOMY  04/12/2010   XBM:WUXLKG rectum/Pancolonic diverticula/Polyp at hepatic flexure and cecum, resected with snare technique as above  . KNEE ARTHROSCOPY     Arthroscopic surgery x 2  . SHOULDER SURGERY    . TRANSURETHRAL RESECTION OF BLADDER       There were no vitals filed for this visit.  Subjective Assessment - 12/12/17 0826    Subjective  "I play with it every day" regarding his exercises and doing his HEP. "I hope every morning that I can walk when I wake up...not so far". Patient reports he has some pain all the tiem with walking.     Pertinent History  patient has difficulty providing history secondary to attention to questions    Limitations  Standing;Walking;House hold activities    How long can you sit comfortably?  not limited    How long can you stand comfortably?  very limited    How long can you walk comfortably?  very limited (household distances are difficult)    Patient Stated Goals  to have less knee pain and walk better    Currently in Pain?  Yes    Pain Score  5     Pain Location  Knee    Pain Orientation  Right;Left    Pain Descriptors / Indicators  Aching;Sore;Constant    Pain Onset  More than a month ago    Pain Frequency  Constant    Aggravating Factors   walking, standing    Pain Relieving Factors  rest    Effect of Pain on Daily Activities  severely limited  [facility] Adult PT Treatment/Exercise - 12/12/17 0001      Exercises   Exercises  Knee/Hip      Knee/Hip Exercises: Seated   Long Arc Quad  Strengthening;Both;1 set;15 reps    Long Arc Quad Limitations  3 sec holds    Other Seated Knee/Hip Exercises  heel/toe raises, 1x 15 reps bil LE, 3 sec holds      Knee/Hip Exercises: Supine   Quad Sets  Both;15 reps;2 sets    Quad Sets Limitations  TKE into ball, 5 second holds    Bridges  Both;Strengthening;Limitations;15 reps;1 [employer] Limitations  3 sec holds; with red theraband    Other Supine Knee/Hip Exercises  Contract/relax stretch: bil LE, 3x 5 second contract/30 seconds relax for hamstring stretch      Knee/Hip Exercises: Sidelying   Clams  1x 15 reps bil LE, red theraband      Manual Therapy   Manual Therapy  Joint mobilization;Passive ROM    Manual therapy comments   manual completed seperate from other interventions    Joint Mobilization  3x 30-45 seconds grade III oscillations for AP glide to bil tibiofemoral joint to improve flexion    Passive ROM  3x 10 reps (between joint mobilization) bil tibfem joints        PT Education - 12/12/17 0828    Education Details  Educated on exercises throughout session.    Person(s) Educated  Patient    Methods  Explanation    Comprehension  Verbalized understanding       PT Short Term Goals - 11/26/17 1248      PT SHORT TERM GOAL #1   Title  Patient will be independent with HEP, updated PRN, to improve/progress funcitonal ROM and strength to improve mobility.    Time  2    Period  Weeks    Status  On-going      PT SHORT TERM GOAL #2   Title  Patient will improve ROM for bil tibiofemoral joints by 8 degrees to demonstrate significant improvement in ROM to improve performance with gait and functional mobility.    Baseline  improved for bil LE however not by 8 degrees    Time  4    Period  Weeks    Status  On-going      PT SHORT TERM GOAL #3   Title  Patient will ambulate 100 feet with LRAD and safe gait pattern to demonstrate improve endurance and improved home/community access.    Time  4    Period  Weeks    Status  Achieved        PT Long Term Goals - 10/30/17 1031      PT LONG TERM GOAL #1   Title  Patient will improve ROM for bil tibiofemoral joints by 15 degrees to demonstrate significant improvement in ROM to improve performance with gait and functional mobility.    Time  8    Period  Weeks    Status  On-going      PT LONG TERM GOAL #2   Title  Patient will improve MMT by 1 grade for limited groups to demonstrate improve functional LE strength to improve performance with functional gait and mobility.    Time  8    Period  Weeks    Status  On-going      PT LONG TERM GOAL #3   Title  Patient will ambulate 150 feet at 0.8 m/s to demonstrate improved home and  community access and improve  independence with mobiltiy.     Time  8    Period  Weeks    Status  On-going      PT LONG TERM GOAL #4   Title  Patient will ascend/descend 4x 6 inch stairs with modified independence to improve safety with home access as he has stairs to enter.    Time  8    Period  Weeks    Status  On-going        Plan - 12/12/17 0831    Clinical Impression Statement  Therapy focus remains on mobility and ROM exercises with greater focus on quad strengthening and therapeutic hamstring stretches to facilitate increase knee extension. Continued with contract -relax stretch today and patient denied pain with exercise, he did not experience any knee locking exercises this session. Manual joint mobilization was continued for bil tibiofemoral glides for flexion and extension. He will continue to benefit from skilled PT interventions to address impairments and further educate on options to manage bil knee osteoarthritis.    Rehab Potential  Fair    Clinical Impairments Affecting Rehab Potential  (-) financial restrictions    PT Frequency  2x / week    PT Duration  8 weeks    PT Treatment/Interventions  ADLs/Self Care Home Management;Aquatic Therapy;Cryotherapy;Electrical Stimulation;Moist Heat;DME Instruction;Gait training;Stair training;Functional mobility training;Therapeutic activities;Therapeutic exercise;Balance training;Neuromuscular re-education;Patient/family education;Manual techniques;Passive range of motion;Taping    PT Next Visit Plan  Continue manual intervention to bil knee joints. Continue with supine strengthening and stretching including contract relax. Perform sit to stands and heel/toe raises. Add standing gastroc stretch.    PT Home Exercise Plan  Eval: seated hamstring stretch, quad set, sit<>stand, supine hamstring stretch, bridge    Consulted and Agree with Plan of Care  Patient       Patient will benefit from skilled therapeutic intervention in order to improve the following deficits and  impairments:  Abnormal gait, Decreased balance, Decreased endurance, Decreased mobility, Difficulty walking, Hypomobility, Increased muscle spasms, Improper body mechanics, Decreased range of motion, Decreased activity tolerance, Decreased strength, Decreased knowledge of use of DME, Increased fascial restricitons, Impaired flexibility, Pain  Visit Diagnosis: Chronic pain of left knee  Chronic pain of right knee  Other abnormalities of gait and mobility     Problem List Patient Active Problem List   Diagnosis Date Noted  . Primary osteoarthritis of both knees 01/07/2014  . Odynophagia 06/08/2013  . GERD (gastroesophageal reflux disease) 06/08/2013  . HYPERLIPIDEMIA 02/10/2010  . HYPERTENSION 03/24/2009  . ATHEROSCLEROTIC CARDIOVASCULAR DISEASE 03/24/2009  . DEGENERATIVE JOINT DISEASE 03/24/2009  . HYPERGLYCEMIA 03/24/2009    Valentino Saxonachel Quinn-Brown, PT, DPT Physical Therapist with [facility]  12/12/2017 9:07 AM     [facility] [address] [place], [place], [address] Phone: [phone]   Fax:  [phone]  Name: Glenn Brooks MRN: [id number] Date of Birth: 07-Jan-1930

## 2017-12-17 ENCOUNTER — Encounter (HOSPITAL_COMMUNITY): Payer: Self-pay

## 2017-12-17 ENCOUNTER — Ambulatory Visit (HOSPITAL_COMMUNITY): Payer: Medicare HMO

## 2017-12-17 DIAGNOSIS — M25562 Pain in left knee: Principal | ICD-10-CM

## 2017-12-17 DIAGNOSIS — R2689 Other abnormalities of gait and mobility: Secondary | ICD-10-CM

## 2017-12-17 DIAGNOSIS — G8929 Other chronic pain: Secondary | ICD-10-CM

## 2017-12-17 DIAGNOSIS — M25561 Pain in right knee: Secondary | ICD-10-CM

## 2017-12-17 NOTE — Therapy (Signed)
[facility] [address] [place], [place], [address] Phone: [phone]   Fax:  [phone]  Physical Therapy Treatment  Patient Details  Name: Glenn Brooks MRN: [id number] Date of Birth: 07-Jun-1929 Referring Provider: Oval Linsey, MD   Encounter Date: 12/17/2017  PT End of Session - 12/17/17 0828    Visit Number  15    Number of Visits  17    Date for PT Re-Evaluation  12/27/17   Minireassess 11/25/17   Authorization Type  [employer] Medicare HMO (no auth required, no visit limit)    Authorization Time Period  10/28/17 - 12/27/17    Authorization - Visit Number  7    Authorization - Number of Visits  10    PT Start Time  6962    PT Stop Time  0859    PT Time Calculation (min)  36 min    Activity Tolerance  Patient tolerated treatment well;No increased pain    Behavior During Therapy  WFL for tasks assessed/performed       Past Medical History:  Diagnosis Date  . Arteriosclerotic cardiovascular disease (ASCVD) 1993   Acute MI->PCI in 1993; asymptomatic PVCs  . ASCVD (arteriosclerotic cardiovascular disease) 1993   Status post MI and percutaneous intervention  . Colonic polyp    excised via colonoscope  . Coronary artery disease   . Degenerative joint disease    shoulder  . ED (erectile dysfunction)    Secondary to medication  . Headache   . Hyperglycemia   . Hyperlipidemia   . Hypertension   . Myocardial infarction (HCC)   . Obesity   . Stroke [facility])    "mini stroke, Bell's palsy"   . Testosterone deficiency    Erectile dysfunction    Past Surgical History:  Procedure Laterality Date  . CATARACT EXTRACTION, BILATERAL    . COLONOSCOPY W/ POLYPECTOMY  04/12/2010   XBM:[id number] rectum/Pancolonic diverticula/Polyp at hepatic flexure and cecum, resected with snare technique as above  . KNEE ARTHROSCOPY     Arthroscopic surgery x 2  . SHOULDER SURGERY    . TRANSURETHRAL RESECTION OF BLADDER      There were no vitals  filed for this visit.  Subjective Assessment - 12/17/17 0824    Subjective  Pt stated he continues to have pain Bil knee, reports they continue to lock up on him while going down stairs.      Pertinent History  patient has difficulty providing history secondary to attention to questions    Patient Stated Goals  to have less knee pain and walk better    Currently in Pain?  Yes    Pain Score  5     Pain Location  Knee    Pain Orientation  Right;Left    Pain Descriptors / Indicators  Aching;Constant;Sore;Dull    Pain Type  Chronic pain    Pain Radiating Towards  Rt> Lt    Pain Onset  More than a month ago    Pain Frequency  Constant    Aggravating Factors   walking, standing    Pain Relieving Factors  rest    Effect of Pain on Daily Activities  severely limited                       [facility] Adult PT Treatment/Exercise - 12/17/17 0001      Exercises   Exercises  Knee/Hip      Knee/Hip Exercises: Stretches   Theme park manager  2 reps;30 seconds    Gastroc Stretch Limitations  against the wall      Knee/Hip Exercises: Standing   Heel Raises  Both;15 reps    Heel Raises Limitations  decline slope with toe raises wiht HHA      Knee/Hip Exercises: Seated   Sit to Sand  10 reps;without UE support;Other (comment)   normal chair height no HHA, cueing for eccentric control     Knee/Hip Exercises: Supine   Quad Sets  Both;15 reps;2 sets    Quad Sets Limitations  TKE into ball 5" holds    Bridges  Both;Strengthening;Limitations;15 reps;1 set    Bridges Limitations  3 sec holds; with red theraband      Manual Therapy   Manual Therapy  Joint mobilization;Passive ROM    Manual therapy comments  manual completed seperate from other interventions    Joint Mobilization  3x 30-45 seconds grade III oscillations for AP glide to bil tibiofemoral joint to improve flexion    Passive ROM  3x 10 reps (between joint mobilization) bil tibfem joints               PT Short Term  Goals - 11/26/17 1248      PT SHORT TERM GOAL #1   Title  Patient will be independent with HEP, updated PRN, to improve/progress funcitonal ROM and strength to improve mobility.    Time  2    Period  Weeks    Status  On-going      PT SHORT TERM GOAL #2   Title  Patient will improve ROM for bil tibiofemoral joints by 8 degrees to demonstrate significant improvement in ROM to improve performance with gait and functional mobility.    Baseline  improved for bil LE however not by 8 degrees    Time  4    Period  Weeks    Status  On-going      PT SHORT TERM GOAL #3   Title  Patient will ambulate 100 feet with LRAD and safe gait pattern to demonstrate improve endurance and improved home/community access.    Time  4    Period  Weeks    Status  Achieved        PT Long Term Goals - 10/30/17 1031      PT LONG TERM GOAL #1   Title  Patient will improve ROM for bil tibiofemoral joints by 15 degrees to demonstrate significant improvement in ROM to improve performance with gait and functional mobility.    Time  8    Period  Weeks    Status  On-going      PT LONG TERM GOAL #2   Title  Patient will improve MMT by 1 grade for limited groups to demonstrate improve functional LE strength to improve performance with functional gait and mobility.    Time  8    Period  Weeks    Status  On-going      PT LONG TERM GOAL #3   Title  Patient will ambulate 150 feet at 0.8 m/s to demonstrate improved home and community access and improve independence with mobiltiy.     Time  8    Period  Weeks    Status  On-going      PT LONG TERM GOAL #4   Title  Patient will ascend/descend 4x 6 inch stairs with modified independence to improve safety with home access as he has stairs to enter.    Time  8  Period  Weeks    Status  On-going            Plan - 12/17/17 0847    Clinical Impression Statement  Continued established PT POC focusing on mobility and quad/gluteal strengthening.  Began standing  heel/toe raises for strengthening to improve gait mechanics and stretches for mobility to improve dorsiflexion and knee extension.  Continued manual joint mobs and contract/relax techniques to improve ROM in pain free range.  Pt continues to be limited by pain with weight bearing though does report improvements iwht mobility and improve ability with sleep.      Rehab Potential  Fair    Clinical Impairments Affecting Rehab Potential  (-) financial restrictions    PT Frequency  2x / week    PT Duration  8 weeks    PT Treatment/Interventions  ADLs/Self Care Home Management;Aquatic Therapy;Cryotherapy;Electrical Stimulation;Moist Heat;DME Instruction;Gait training;Stair training;Functional mobility training;Therapeutic activities;Therapeutic exercise;Balance training;Neuromuscular re-education;Patient/family education;Manual techniques;Passive range of motion;Taping    PT Next Visit Plan  Continue manual intervention to bil knee joints. Continue with supine strengthening and stretching including contract relax. Perform sit to stands and heel/toe raises. Begin standing gastroc stretch with slant board.    PT Home Exercise Plan  Eval: seated hamstring stretch, quad set, sit<>stand, supine hamstring stretch, bridge       Patient will benefit from skilled therapeutic intervention in order to improve the following deficits and impairments:  Abnormal gait, Decreased balance, Decreased endurance, Decreased mobility, Difficulty walking, Hypomobility, Increased muscle spasms, Improper body mechanics, Decreased range of motion, Decreased activity tolerance, Decreased strength, Decreased knowledge of use of DME, Increased fascial restricitons, Impaired flexibility, Pain  Visit Diagnosis: Chronic pain of left knee  Other abnormalities of gait and mobility  Chronic pain of right knee     Problem List Patient Active Problem List   Diagnosis Date Noted  . Primary osteoarthritis of both knees 01/07/2014  .  Odynophagia 06/08/2013  . GERD (gastroesophageal reflux disease) 06/08/2013  . HYPERLIPIDEMIA 02/10/2010  . HYPERTENSION 03/24/2009  . ATHEROSCLEROTIC CARDIOVASCULAR DISEASE 03/24/2009  . DEGENERATIVE JOINT DISEASE 03/24/2009  . HYPERGLYCEMIA 03/24/2009   Becky Saxasey Trezure Cronk, LPTA; CBIS [phone]  Juel BurrowCockerham, Shanena Pellegrino Jo 12/17/2017, 12:54 PM  [facility] [address] [place], [place], [address] Phone: [phone]   Fax:  [phone]  Name: Glenn Brooks MRN: [id number] Date of Birth: September 08, 1929

## 2017-12-19 ENCOUNTER — Encounter (HOSPITAL_COMMUNITY): Payer: Self-pay

## 2017-12-19 ENCOUNTER — Other Ambulatory Visit: Payer: Self-pay

## 2017-12-19 ENCOUNTER — Ambulatory Visit (HOSPITAL_COMMUNITY): Payer: Medicare HMO

## 2017-12-19 DIAGNOSIS — M25562 Pain in left knee: Principal | ICD-10-CM

## 2017-12-19 DIAGNOSIS — M25561 Pain in right knee: Secondary | ICD-10-CM

## 2017-12-19 DIAGNOSIS — G8929 Other chronic pain: Secondary | ICD-10-CM

## 2017-12-19 DIAGNOSIS — R2689 Other abnormalities of gait and mobility: Secondary | ICD-10-CM

## 2017-12-19 NOTE — Patient Instructions (Signed)
"  Forearm Crutches" or "Lofstrand Crutches"     Can purchase on [employer], at [place], or a medical supply store.

## 2017-12-19 NOTE — Therapy (Signed)
[facility] [place], [place], [address] Phone: [phone]   Fax:  [phone]  Physical Therapy Treatment/Discharge Summary  Patient Details  Name: Glenn Brooks MRN: [id number] Date of Birth: Sep 07, 1929 Referring Provider: Lucia Gaskins, MD   Encounter Date: 12/19/2017  PHYSICAL THERAPY DISCHARGE SUMMARY  Visits from Start of Care: 16  Current functional level related to goals / functional outcomes: Re-assessment performed today and patient has continued to make progress with bil LE strength and demonstrated further improvement with MMT. His ROM for bil knee flexion has improved further however his knee extension AROM remains the same as half way re-assessment measures. He has made a significant change in ROM for flex/ext bilaterally with fewer gains seen in last ~3 weeks. His gait velocity improved form initial evaluation from 0.27 m/s to 0.36 m/s however he remains in fall risk category. He was educated on where to purchase "forearm crutches" as he expressed an interest and that he can return for gait training with crutches if he acquires them. He was educated on importance of continuing with HEP to maintain progress. He has previously been educated on options for surgical interventions given his good response to conservative treatment, however he has stated he is no longer interested in this option. I encouraged him to discuss this with his primary physician. He will be discharged after this session.   Remaining deficits: See below details.   Education / Equipment: Educated on readiness for discharge based on progress and gradual improvement/maintaing progress made at re-assessment. Patient was educated on where to purchse lofstrand crutches and to ask his MD on monday about a prescription for them to make them more affordable.    Plan: Patient agrees to discharge.  Patient goals were partially met. Patient is being  discharged due to meeting the stated rehab goals.  ?????       Glenn Brooks End of Session - 12/19/17 0902    Visit Number  16    Number of Visits  17    Date for Glenn Brooks Re-Evaluation  12/27/17   Minireassess 11/25/17   Authorization Type  [employer] Medicare HMO (no auth required, no visit limit)    Authorization Time Period  10/28/17 - 12/27/17    Authorization - Visit Number  8    Authorization - Number of Visits  10    Glenn Brooks Start Time  6962    Glenn Brooks Stop Time  0855    Glenn Brooks Time Calculation (min)  38 min    Activity Tolerance  Patient tolerated treatment well;No increased pain    Behavior During Therapy  WFL for tasks assessed/performed       Past Medical History:  Diagnosis Date  . Arteriosclerotic cardiovascular disease (ASCVD) 1993   Acute MI->PCI in 1993; asymptomatic PVCs  . ASCVD (arteriosclerotic cardiovascular disease) 1993   Status post MI and percutaneous intervention  . Colonic polyp    excised via colonoscope  . Coronary artery disease   . Degenerative joint disease    shoulder  . ED (erectile dysfunction)    Secondary to medication  . Headache   . Hyperglycemia   . Hyperlipidemia   . Hypertension   . Myocardial infarction ([place])   . Obesity   . Stroke [facility])    "mini stroke, Bell's palsy"   . Testosterone deficiency    Erectile dysfunction    Past Surgical History:  Procedure Laterality Date  . CATARACT EXTRACTION, BILATERAL    . COLONOSCOPY W/ POLYPECTOMY  04/12/2010   QIW:LNLGXQ rectum/Pancolonic diverticula/Polyp at hepatic flexure and cecum, resected with snare technique as above  . KNEE ARTHROSCOPY     Arthroscopic surgery x 2  . SHOULDER SURGERY    . TRANSURETHRAL RESECTION OF BLADDER      There were no vitals filed for this visit.  Subjective Assessment - 12/19/17 1194    Subjective  Patient reports he continues to heve pain while walking or standing and that occasionally has sharp pain while sitting. He asks about how to purchase "lofstrand curtches".     Pertinent History  patient has difficulty providing history secondary to attention to questions    Limitations  Standing;Walking;House hold activities    How long can you sit comfortably?  not limited    How long can you stand comfortably?  very limited    How long can you walk comfortably?  very limited (household distances are difficult)    Patient Stated Goals  to have less knee pain and walk better    Currently in Pain?  Yes    Pain Score  5     Pain Location  Knee    Pain Orientation  Right;Left    Pain Descriptors / Indicators  Aching;Sharp    Pain Type  Chronic pain    Pain Radiating Towards  Rt>Lt    Pain Onset  More than a month ago    Pain Frequency  Constant    Aggravating Factors   walking, stairs, standing    Pain Relieving Factors  rest    Effect of Pain on Daily Activities  severe         [facility] Glenn Brooks Assessment - 12/19/17 0001      Assessment   Medical Diagnosis  Bil Knee Pain    Referring Provider  Lucia Gaskins, MD    Next MD Visit  12/23/2017      Precautions   Precautions  Fall      Restrictions   Weight Bearing Restrictions  No      Cognition   Overall Cognitive Status  Within Functional Limits for tasks assessed      AROM   Right Knee Extension  21   was 32   Right Knee Flexion  116   was 108   Left Knee Extension  10   was 19   Left Knee Flexion  118   was 106     PROM   Right Knee Flexion  120   was 110   Left Knee Flexion  122   was 112     Strength   Right Hip Flexion  4+/5   2+   Right Hip ABduction  3+/5   2+   Left Hip Flexion  4+/5   2+   Left Hip ABduction  4/5   2+   Right Knee Flexion  4/5   3- (in sitting)   Right Knee Extension  4/5   3+   Left Knee Flexion  4/5   3- (in sitting)   Left Knee Extension  4+/5   3+   Right Ankle Dorsiflexion  4+/5   3+   Left Ankle Dorsiflexion  4+/5   3+     Ambulation/Gait   Ambulation/Gait  Yes    Ambulation/Gait Assistance  6: Modified independent (Device/Increase  time);5: Supervision    Ambulation/Gait Assistance Details  --   2MWT   Ambulation Distance (Feet)  142 Feet    Assistive device  R Axillary Crutch;L Axillary Crutch  Gait Pattern  Step-to pattern;Decreased step length - left;Decreased step length - right;Decreased stride length;Right flexed knee in stance;Left flexed knee in stance;Shuffle;Trunk flexed;Poor foot clearance - left;Poor foot clearance - right    Ambulation Surface  Level;Indoor    Gait velocity  0.36 m/s    Stairs  Yes    Stairs Assistance  6: Modified independent (Device/Increase time)    Stair Management Technique  One rail Left;With crutches;Forwards;Step to pattern    Number of Stairs  4    Height of Stairs  6    Gait Comments  patient performs stairs with Lt handrail and Rt UE with 2 axillary crutches        Glenn Brooks Education - 12/19/17 0901    Education Details  Educated on readiness for discharge based on progress and gradual improvement/maintaing progress made at re-assessment. Patient was educated on where to purchse lofstrand crutches and to ask his MD on monday about a prescription for them to make them more affordable.     Person(s) Educated  Patient    Methods  Explanation;Handout    Comprehension  Verbalized understanding       Glenn Brooks Short Term Goals - 12/19/17 0903      Glenn Brooks SHORT TERM GOAL #1   Title  Patient will be independent with HEP, updated PRN, to improve/progress funcitonal ROM and strength to improve mobility.    Time  2    Period  Weeks    Status  Achieved      Glenn Brooks SHORT TERM GOAL #2   Title  Patient will improve ROM for bil tibiofemoral joints by 8 degrees to demonstrate significant improvement in ROM to improve performance with gait and functional mobility.    Baseline  improved for bil LE however not by 8 degrees    Time  4    Period  Weeks    Status  Achieved      Glenn Brooks SHORT TERM GOAL #3   Title  Patient will ambulate 100 feet with LRAD and safe gait pattern to demonstrate improve endurance  and improved home/community access.    Time  4    Period  Weeks    Status  Achieved        Glenn Brooks Long Term Goals - 12/19/17 2355      Glenn Brooks LONG TERM GOAL #1   Title  Patient will improve ROM for bil tibiofemoral joints by 15 degrees to demonstrate significant improvement in ROM to improve performance with gait and functional mobility.    Time  8    Period  Weeks    Status  Not Met      Glenn Brooks LONG TERM GOAL #2   Title  Patient will improve MMT by 1 grade for limited groups to demonstrate improve functional LE strength to improve performance with functional gait and mobility.    Time  8    Period  Weeks    Status  Partially Met      Glenn Brooks LONG TERM GOAL #3   Title  Patient will ambulate 150 feet at 0.8 m/s to demonstrate improved home and community access and improve independence with mobiltiy.     Baseline  0.36 m/s in 2MWT    Time  8    Period  Weeks    Status  Not Met      Glenn Brooks LONG TERM GOAL #4   Title  Patient will ascend/descend 4x 6 inch stairs with modified independence to improve safety with home access as  he has stairs to enter.    Baseline  Mod I with axillary crutches and Handrail    Time  8    Period  Weeks    Status  Achieved         Plan - 12/19/17 0902    Clinical Impression Statement  Re-assessment performed today and patient has continued to make progress with bil LE strength and demonstrated further improvement with MMT. His ROM for bil knee flexion has improved further however his knee extension AROM remains the same as half way re-assessment measures. He has made a significant change in ROM for flex/ext bilaterally with fewer gains seen in last ~3 weeks. His gait velocity improved form initial evaluation from 0.27 m/s to 0.36 m/s however he remains in fall risk category. He was educated on where to purchase "forearm crutches" as he expressed an interest and that he can return for gait training with crutches if he acquires them. He was educated on importance of  continuing with HEP to maintain progress. He has previously been educated on options for surgical interventions given his good response to conservative treatment, however he has stated he is no longer interested in this option. I encouraged him to discuss this with his primary physician. He will be discharged after this session.    Rehab Potential  Fair    Clinical Impairments Affecting Rehab Potential  (-) financial restrictions    Glenn Brooks Frequency  2x / week    Glenn Brooks Duration  8 weeks    Glenn Brooks Treatment/Interventions  ADLs/Self Care Home Management;Aquatic Therapy;Cryotherapy;Electrical Stimulation;Moist Heat;DME Instruction;Gait training;Stair training;Functional mobility training;Therapeutic activities;Therapeutic exercise;Balance training;Neuromuscular re-education;Patient/family education;Manual techniques;Passive range of motion;Taping    Glenn Brooks Next Visit Plan  Discharge    Glenn Brooks Home Exercise Plan  Eval: seated hamstring stretch, quad set, sit<>stand, supine hamstring stretch, bridge    Consulted and Agree with Plan of Care  Patient       Patient will benefit from skilled therapeutic intervention in order to improve the following deficits and impairments:  Abnormal gait, Decreased balance, Decreased endurance, Decreased mobility, Difficulty walking, Hypomobility, Increased muscle spasms, Improper body mechanics, Decreased range of motion, Decreased activity tolerance, Decreased strength, Decreased knowledge of use of DME, Increased fascial restricitons, Impaired flexibility, Pain  Visit Diagnosis: Chronic pain of left knee  Other abnormalities of gait and mobility  Chronic pain of right knee     Problem List Patient Active Problem List   Diagnosis Date Noted  . Primary osteoarthritis of both knees 01/07/2014  . Odynophagia 06/08/2013  . GERD (gastroesophageal reflux disease) 06/08/2013  . HYPERLIPIDEMIA 02/10/2010  . HYPERTENSION 03/24/2009  . ATHEROSCLEROTIC CARDIOVASCULAR DISEASE 03/24/2009   . DEGENERATIVE JOINT DISEASE 03/24/2009  . HYPERGLYCEMIA 03/24/2009    Glenn Brooks, Glenn Brooks, Glenn Brooks Physical Therapist with [facility]  12/19/2017 9:13 AM    [facility] [address] [place], [place], [address] Phone: [phone]   Fax:  [phone]  Name: Glenn Brooks MRN: [id number] Date of Birth: Aug 15, 1929

## 2017-12-25 ENCOUNTER — Encounter: Payer: Self-pay | Admitting: Orthopaedic Surgery

## 2017-12-25 ENCOUNTER — Ambulatory Visit: Payer: Self-pay | Admitting: Orthopaedic Surgery

## 2018-04-10 ENCOUNTER — Other Ambulatory Visit: Payer: Self-pay | Admitting: Cardiovascular Disease

## 2018-04-12 IMAGING — DX DG LUMBAR SPINE COMPLETE 4+V
5 series · 5 of 5 positions shown · non-contrast
Comparison: None.

CLINICAL DATA: Low back pain

EXAM:
LUMBAR SPINE - COMPLETE 4+ VIEW

[l-spine ap]
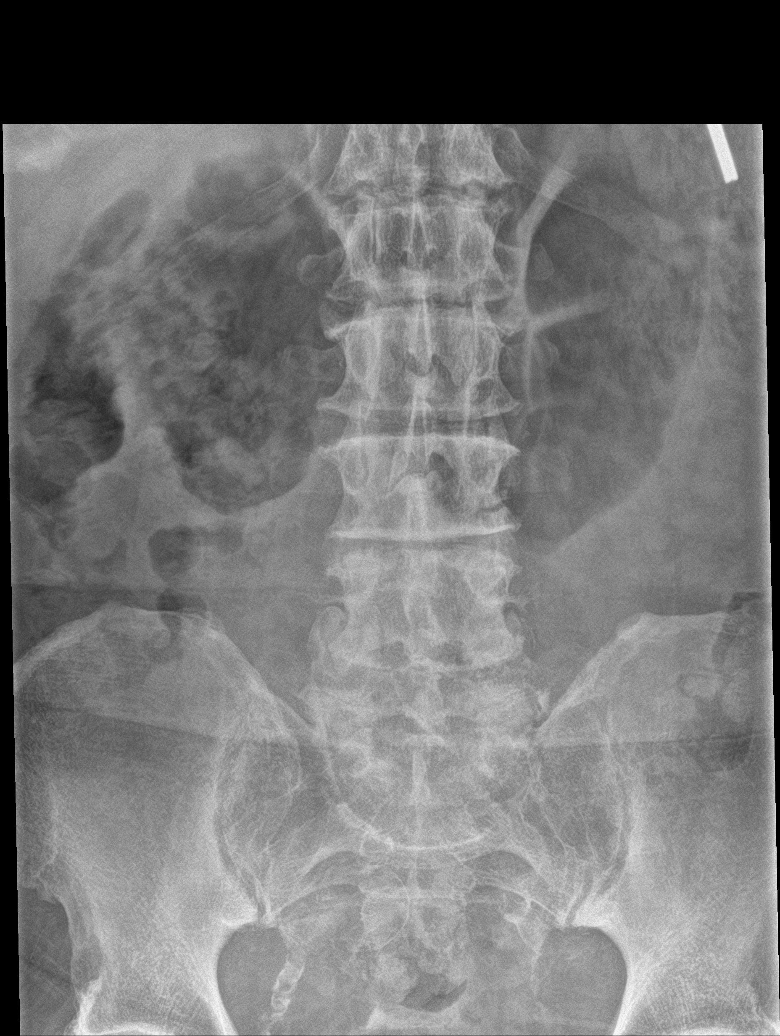

[l-spine obl (1 of 2)]
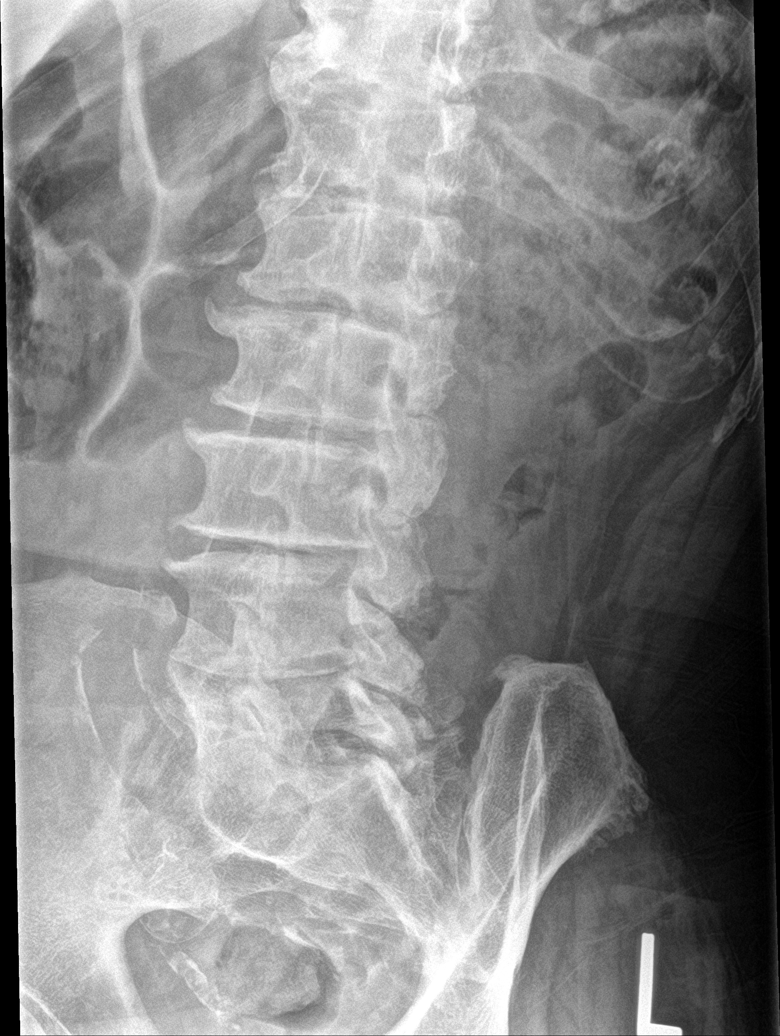

[l-spine obl (2 of 2)]
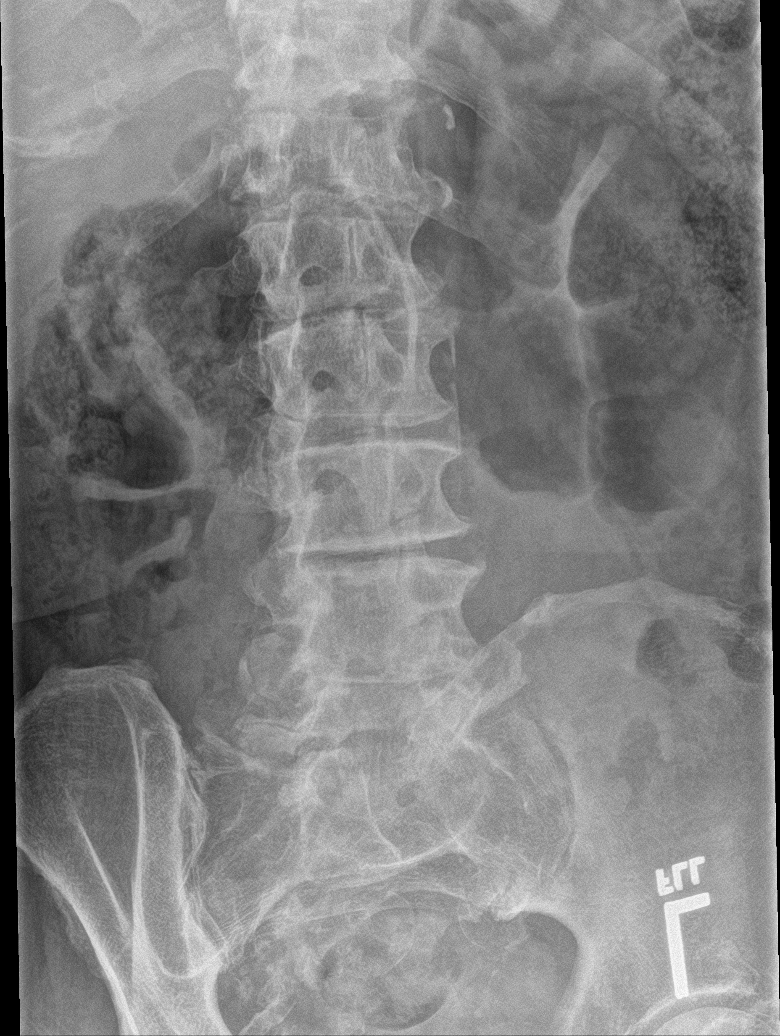

[l-spine lat]
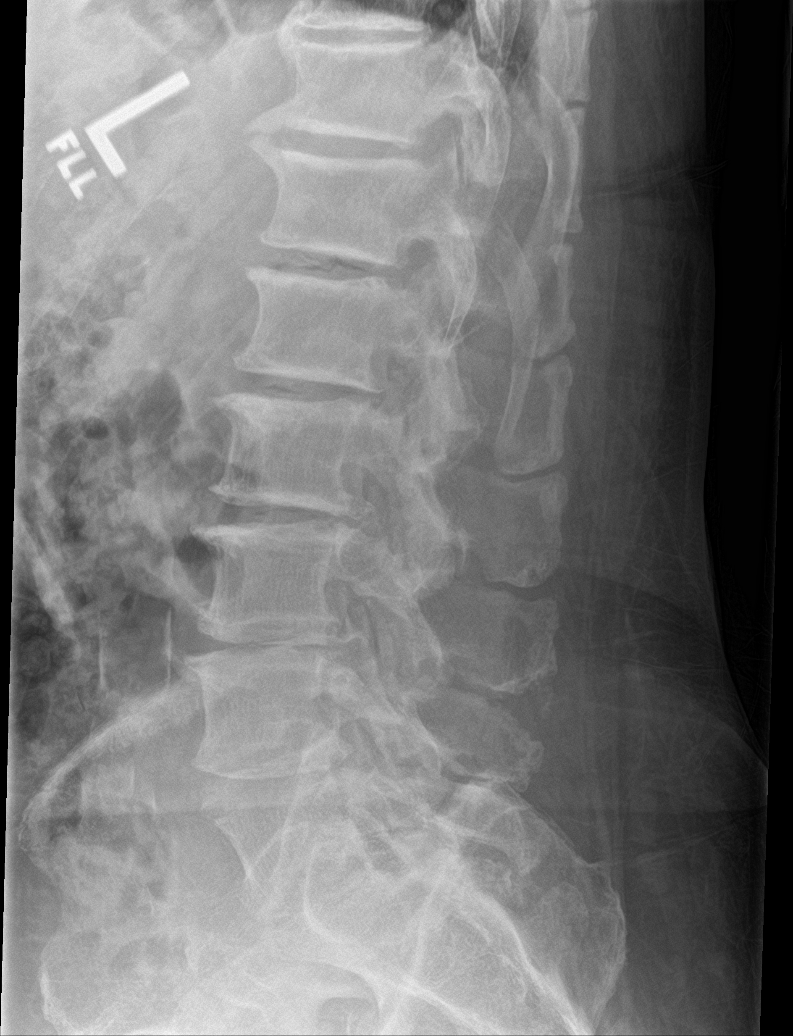

[l-spine spot]
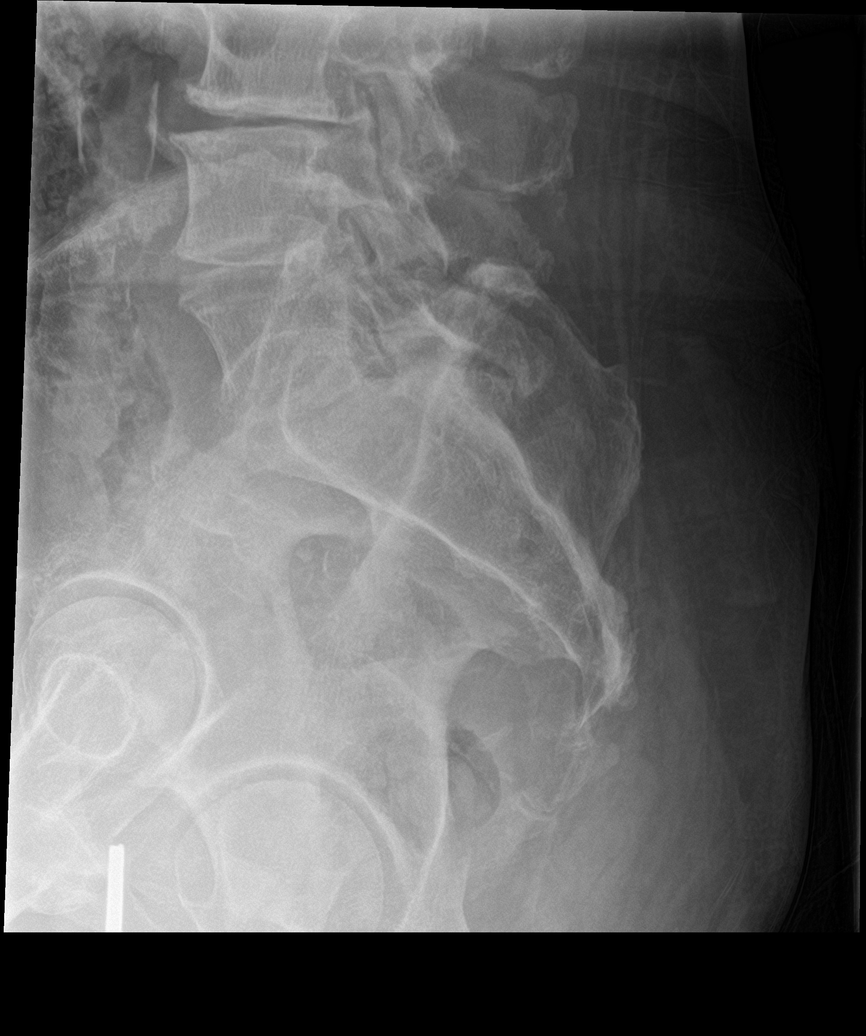

[5 of 5 positions shown; findings below may reference images not displayed]

FINDINGS: Five lumbar type vertebral bodies are well visualized. Vertebral
body height is well maintained. Disc space narrowing is noted at all
lumbar levels with associated osteophytic change. No anterolisthesis
is seen. No pars defects are noted. Mild aortic calcifications are
noted without aneurysmal dilatation.
IMPRESSION: Multilevel degenerative change without acute abnormality.

## 2018-05-21 ENCOUNTER — Ambulatory Visit: Payer: Medicare Other | Admitting: Orthopaedic Surgery

## 2018-05-21 ENCOUNTER — Ambulatory Visit (INDEPENDENT_AMBULATORY_CARE_PROVIDER_SITE_OTHER): Payer: Medicare Other

## 2018-05-21 ENCOUNTER — Encounter: Payer: Self-pay | Admitting: Orthopaedic Surgery

## 2018-05-21 VITALS — BP 141/74 | HR 77 | Ht 67.0 in | Wt 180.0 lb

## 2018-05-21 DIAGNOSIS — G8929 Other chronic pain: Secondary | ICD-10-CM

## 2018-05-21 DIAGNOSIS — M25562 Pain in left knee: Secondary | ICD-10-CM | POA: Diagnosis not present

## 2018-05-21 DIAGNOSIS — M25561 Pain in right knee: Secondary | ICD-10-CM

## 2018-05-21 NOTE — Progress Notes (Signed)
Subjective:    Patient ID: Glenn Brooks, male    DOB: [birth year], 85 y.o.   MRN: [id number]  HPI He has bilateral knee pain for some time.  He has no trauma.  He has swelling and popping.  He has no giving way, no redness.  He has seen Dr. Janna Arch for this and I have reviewed the notes. The patient says he is getting worse.  He is using crutches.  He says the cold weather has made them worse.   Review of Systems  Constitutional: Positive for activity change.  Musculoskeletal: Positive for arthralgias, gait problem and joint swelling.  Neurological: Positive for headaches.  All other systems reviewed and are negative.  For Review of Systems, all other systems reviewed and are negative.  The following is a summary of the past history medically, past history surgically, known current medicines, social history and family history.  This information is gathered electronically by the computer from prior information and documentation.  I review this each visit and have found including this information at this point in the chart is beneficial and informative.   Past Medical History:  Diagnosis Date  . Arteriosclerotic cardiovascular disease (ASCVD) 1993   Acute MI->PCI in 1993; asymptomatic PVCs  . ASCVD (arteriosclerotic cardiovascular disease) 1993   Status post MI and percutaneous intervention  . Colonic polyp    excised via colonoscope  . Coronary artery disease   . Degenerative joint disease    shoulder  . ED (erectile dysfunction)    Secondary to medication  . Headache   . Hyperglycemia   . Hyperlipidemia   . Hypertension   . Myocardial infarction (HCC)   . Obesity   . Stroke [facility])    "mini stroke, Bell's palsy"   . Testosterone deficiency    Erectile dysfunction    Past Surgical History:  Procedure Laterality Date  . CATARACT EXTRACTION, BILATERAL    . COLONOSCOPY W/ POLYPECTOMY  04/12/2010   UKG:URKYHC rectum/Pancolonic diverticula/Polyp at hepatic flexure and  cecum, resected with snare technique as above  . KNEE ARTHROSCOPY     Arthroscopic surgery x 2  . SHOULDER SURGERY    . TRANSURETHRAL RESECTION OF BLADDER      Current Outpatient Medications on File Prior to Visit  Medication Sig Dispense Refill  . acetaminophen (TYLENOL) 500 MG tablet Take 500-1,000 mg by mouth daily as needed for moderate pain or headache.    . allopurinol (ZYLOPRIM) 300 MG tablet TAKE ONE TABLET BY MOUTH ONCE DAILY 90 tablet 5  . aspirin EC 81 MG tablet Take 81 mg by mouth daily.    . cetirizine (ZYRTEC) 10 MG tablet Take 10 mg by mouth daily.    Marland Kitchen diltiazem (CARTIA XT) 180 MG 24 hr capsule Take 1 capsule (180 mg total) by mouth daily. 15 capsule 0  . ketorolac (ACULAR) 0.5 % ophthalmic solution Place 1 drop into both eyes every 6 (six) hours. (Patient taking differently: Place 1 drop into both eyes daily. ) 10 mL 0  . latanoprost (XALATAN) 0.005 % ophthalmic solution Place 1 drop into both eyes at bedtime.    Marland Kitchen LORazepam (ATIVAN) 2 MG tablet Take 2 mg by mouth at bedtime.    . metoprolol succinate (TOPROL-XL) 100 MG 24 hr tablet TAKE 1 TABLET BY MOUTH ONCE DAILY WITH MEAL 30 tablet 0  . naproxen (NAPROSYN) 500 MG tablet Take 500 mg by mouth 2 (two) times daily with a meal.    . naproxen sodium (ALEVE)  220 MG tablet Take 220 mg by mouth daily as needed (pain).    . nitroGLYCERIN (NITROSTAT) 0.4 MG SL tablet Place 1 tablet (0.4 mg total) under the tongue every 5 (five) minutes as needed. 25 tablet 6  . omeprazole (PRILOSEC) 20 MG capsule Take 20 mg by mouth daily.    . silodosin (RAPAFLO) 8 MG CAPS capsule Take 8 mg by mouth daily.    . simvastatin (ZOCOR) 80 MG tablet Take 1 tablet (80 mg total) by mouth daily. 15 tablet 0   No current facility-administered medications on file prior to visit.     Social History   Socioeconomic History  . Marital status: Married    Spouse name: Not on file  . Number of children: 2  . Years of education: Not on file  . Highest  education level: Not on file  Occupational History  . Occupation: Midwife    Comment: Retired  Engineer, production  . Financial resource strain: Not on file  . Food insecurity:    Worry: Not on file    Inability: Not on file  . Transportation needs:    Medical: Not on file    Non-medical: Not on file  Tobacco Use  . Smoking status: Never Smoker  . Smokeless tobacco: Never Used  Substance and Sexual Activity  . Alcohol use: No    Comment: none in 40 years  . Drug use: No  . Sexual activity: Not on file  Lifestyle  . Physical activity:    Days per week: Not on file    Minutes per session: Not on file  . Stress: Not on file  Relationships  . Social connections:    Talks on phone: Not on file    Gets together: Not on file    Attends religious service: Not on file    Active member of club or organization: Not on file    Attends meetings of clubs or organizations: Not on file    Relationship status: Not on file  . Intimate partner violence:    Fear of current or ex partner: Not on file    Emotionally abused: Not on file    Physically abused: Not on file    Forced sexual activity: Not on file  Other Topics Concern  . Not on file  Social History Narrative  . Not on file    No family history on file.  BP (!) 141/74   Pulse 77   Ht  (1.702 m)   Wt 180 lb (81.6 kg)   BMI 28.19 kg/m   Body mass index is 28.19 kg/m.     Objective:   Physical Exam Constitutional:      Appearance: He is well-developed.  HENT:     Head: Normocephalic and atraumatic.  Eyes:     Conjunctiva/sclera: Conjunctivae normal.     Pupils: Pupils are equal, round, and reactive to light.  Neck:     Musculoskeletal: Normal range of motion and neck supple.  Cardiovascular:     Rate and Rhythm: Normal rate and regular rhythm.  Pulmonary:     Effort: Pulmonary effort is normal.  Abdominal:     Palpations: Abdomen is soft.  Musculoskeletal:     Right knee: He exhibits decreased range of motion  and swelling. Tenderness found. Medial joint line tenderness noted.     Left knee: He exhibits decreased range of motion and swelling. Tenderness found. Medial joint line tenderness noted.       Legs:  Skin:    General: Skin is warm and dry.  Neurological:     Mental Status: He is alert and oriented to person, place, and time.     Cranial Nerves: No cranial nerve deficit.     Motor: No abnormal muscle tone.     Coordination: Coordination normal.     Deep Tendon Reflexes: Reflexes are normal and symmetric. Reflexes normal.  Psychiatric:        Behavior: Behavior normal.        Thought Content: Thought content normal.        Judgment: Judgment normal.      X-rays were done of both knees, reported separately.     Assessment & Plan:   Encounter Diagnosis  Name Primary?  . Chronic pain of both knees Yes   PROCEDURE NOTE:  The patient requests injections of the left knee , verbal consent was obtained.  The left knee was prepped appropriately after time out was performed.   Sterile technique was observed and injection of 1 cc of Depo-Medrol 40 mg with several cc's of plain xylocaine. Anesthesia was provided by ethyl chloride and a 20-gauge needle was used to inject the knee area. The injection was tolerated well.  A band aid dressing was applied.  The patient was advised to apply ice later today and tomorrow to the injection sight as needed.  PROCEDURE NOTE:  The patient requests injections of the right knee , verbal consent was obtained.  The right knee was prepped appropriately after time out was performed.   Sterile technique was observed and injection of 1 cc of Depo-Medrol 40 mg with several cc's of plain xylocaine. Anesthesia was provided by ethyl chloride and a 20-gauge needle was used to inject the knee area. The injection was tolerated well.  A band aid dressing was applied.  The patient was advised to apply ice later today and tomorrow to the injection sight as  needed.  Return in two weeks.  Call if any problem.  Precautions discussed.  He is possible candidate for total knees but he says his heart and lungs are not good and most likely not a suitable surgical candidate.  Electronically Signed Darreld Mclean, MD 2/26/202010:35 AM

## 2018-05-25 ENCOUNTER — Other Ambulatory Visit: Payer: Self-pay | Admitting: Cardiovascular Disease

## 2018-05-30 ENCOUNTER — Telehealth: Payer: Self-pay | Admitting: Cardiovascular Disease

## 2018-05-30 MED ORDER — METOPROLOL SUCCINATE ER 100 MG PO TB24: ORAL_TABLET | ORAL | 0 refills | Status: DC

## 2018-05-30 NOTE — Telephone Encounter (Signed)
Patient requesting refill on Metoprolol sent to [facility]. 2 years past due for f/u. Advised that he would probably only get enough to do until his appointment on June 9.  tg

## 2018-05-30 NOTE — Telephone Encounter (Signed)
Refill sent.

## 2018-06-02 ENCOUNTER — Emergency Department (HOSPITAL_COMMUNITY)
Admission: EM | Admit: 2018-06-02 | Discharge: 2018-06-02 | Disposition: A | Discharge status: Home / Self Care | Payer: Medicare Other | Attending: Emergency Medicine | Admitting: Emergency Medicine

## 2018-06-02 ENCOUNTER — Encounter (HOSPITAL_COMMUNITY): Payer: Self-pay | Admitting: Emergency Medicine

## 2018-06-02 DIAGNOSIS — I10 Essential (primary) hypertension: Secondary | ICD-10-CM | POA: Insufficient documentation

## 2018-06-02 DIAGNOSIS — I2510 Atherosclerotic heart disease of native coronary artery without angina pectoris: Secondary | ICD-10-CM | POA: Diagnosis not present

## 2018-06-02 DIAGNOSIS — Z79899 Other long term (current) drug therapy: Secondary | ICD-10-CM | POA: Diagnosis not present

## 2018-06-02 DIAGNOSIS — B029 Zoster without complications: Secondary | ICD-10-CM

## 2018-06-02 DIAGNOSIS — I252 Old myocardial infarction: Secondary | ICD-10-CM | POA: Diagnosis not present

## 2018-06-02 DIAGNOSIS — R21 Rash and other nonspecific skin eruption: Secondary | ICD-10-CM | POA: Diagnosis present

## 2018-06-02 MED ORDER — VALACYCLOVIR HCL 1 G PO TABS: 1000.0000 mg | ORAL_TABLET | Freq: Three times a day (TID) | ORAL | 0 refills | Status: AC

## 2018-06-02 NOTE — ED Provider Notes (Signed)
[facility] EMERGENCY DEPARTMENT Provider Note   CSN: [id number] Arrival date & time: 06/02/18  6962    History   Chief Complaint Chief Complaint  Patient presents with  . Rash    HPI Glenn Brooks is a 85 y.o. male.     Patient with vascular disease and stroke history presents with gradually worsening rash to left flank, vesicular lesions for the past 10 days.  No other contacts with similar.  No fevers chills or vomiting.  Burning pain.     Past Medical History:  Diagnosis Date  . Arteriosclerotic cardiovascular disease (ASCVD) 1993   Acute MI->PCI in 1993; asymptomatic PVCs  . ASCVD (arteriosclerotic cardiovascular disease) 1993   Status post MI and percutaneous intervention  . Colonic polyp    excised via colonoscope  . Coronary artery disease   . Degenerative joint disease    shoulder  . ED (erectile dysfunction)    Secondary to medication  . Headache   . Hyperglycemia   . Hyperlipidemia   . Hypertension   . Myocardial infarction (HCC)   . Obesity   . Stroke [facility])    "mini stroke, Bell's palsy"   . Testosterone deficiency    Erectile dysfunction    Patient Active Problem List   Diagnosis Date Noted  . Primary osteoarthritis of both knees 01/07/2014  . Odynophagia 06/08/2013  . GERD (gastroesophageal reflux disease) 06/08/2013  . HYPERLIPIDEMIA 02/10/2010  . HYPERTENSION 03/24/2009  . ATHEROSCLEROTIC CARDIOVASCULAR DISEASE 03/24/2009  . DEGENERATIVE JOINT DISEASE 03/24/2009  . HYPERGLYCEMIA 03/24/2009    Past Surgical History:  Procedure Laterality Date  . CATARACT EXTRACTION, BILATERAL    . COLONOSCOPY W/ POLYPECTOMY  04/12/2010   XBM:[id number] rectum/Pancolonic diverticula/Polyp at hepatic flexure and cecum, resected with snare technique as above  . KNEE ARTHROSCOPY     Arthroscopic surgery x 2  . SHOULDER SURGERY    . TRANSURETHRAL RESECTION OF BLADDER          Home Medications    Prior to Admission medications   Medication Sig Start  Date End Date Taking? Authorizing Provider  acetaminophen (TYLENOL) 500 MG tablet Take 500-1,000 mg by mouth daily as needed for moderate pain or headache.    [provider]  allopurinol (ZYLOPRIM) 300 MG tablet TAKE ONE TABLET BY MOUTH ONCE DAILY 12/03/16   Darreld Mclean, MD  aspirin EC 81 MG tablet Take 81 mg by mouth daily.    [provider]  cetirizine (ZYRTEC) 10 MG tablet Take 10 mg by mouth daily.    [provider]  diltiazem (CARTIA XT) 180 MG 24 hr capsule Take 1 capsule (180 mg total) by mouth daily. 01/12/14   Laqueta Linden, MD  ketorolac (ACULAR) 0.5 % ophthalmic solution Place 1 drop into both eyes every 6 (six) hours. Patient taking differently: Place 1 drop into both eyes daily.  03/27/16   Liberty Handy, PA-C  latanoprost (XALATAN) 0.005 % ophthalmic solution Place 1 drop into both eyes at bedtime.    [provider]  LORazepam (ATIVAN) 2 MG tablet Take 2 mg by mouth at bedtime.    [provider]  metoprolol succinate (TOPROL-XL) 100 MG 24 hr tablet TAKE 1 TABLET BY MOUTH ONCE DAILY WITH MEALS 05/30/18   Laqueta Linden, MD  naproxen (NAPROSYN) 500 MG tablet Take 500 mg by mouth 2 (two) times daily with a meal.    [provider]  naproxen sodium (ALEVE) 220 MG tablet Take 220 mg by mouth  daily as needed (pain).    [provider]  nitroGLYCERIN (NITROSTAT) 0.4 MG SL tablet Place 1 tablet (0.4 mg total) under the tongue every 5 (five) minutes as needed. 07/30/12   Kathlen Brunswick, MD  omeprazole (PRILOSEC) 20 MG capsule Take 20 mg by mouth daily.    [provider]  silodosin (RAPAFLO) 8 MG CAPS capsule Take 8 mg by mouth daily.    [provider]  simvastatin (ZOCOR) 80 MG tablet Take 1 tablet (80 mg total) by mouth daily. 12/14/13   Laqueta Linden, MD  valACYclovir (VALTREX) 1000 MG tablet Take 1 tablet (1,000 mg total) by mouth 3 (three) times daily. 06/02/18   Blane Ohara, MD      Family History History reviewed. No pertinent family history.  Social History Social History   Tobacco Use  . Smoking status: Never Smoker  . Smokeless tobacco: Never Used  Substance Use Topics  . Alcohol use: No    Comment: none in 40 years  . Drug use: No     Allergies   Patient has no known allergies.   Review of Systems Review of Systems  Constitutional: Negative for chills and fever.  HENT: Negative for congestion.   Respiratory: Negative for shortness of breath.   Cardiovascular: Negative for chest pain.  Gastrointestinal: Negative for abdominal pain and vomiting.  Genitourinary: Negative for dysuria and flank pain.  Musculoskeletal: Negative for back pain, neck pain and neck stiffness.  Skin: Positive for rash.  Neurological: Negative for light-headedness and headaches.     Physical Exam Updated Vital Signs BP (!) 152/74 (BP Location: Left Arm)   Pulse 71   Temp 98.7 F (37.1 C) (Oral)   Resp 18   Ht 5\' 7"  (1.702 m)   Wt 82 kg   SpO2 100%   BMI 28.31 kg/m   Physical Exam Vitals signs and nursing note reviewed.  Constitutional:      Appearance: He is well-developed.  HENT:     Head: Normocephalic and atraumatic.  Eyes:     General:        Right eye: No discharge.        Left eye: No discharge.  Neck:     Musculoskeletal: Normal range of motion.     Trachea: No tracheal deviation.  Cardiovascular:     Rate and Rhythm: Normal rate.  Pulmonary:     Effort: Pulmonary effort is normal.  Skin:    General: Skin is warm.     Findings: Rash present.     Comments: Patient has vesicular rash lower rib margin  Neurological:     Mental Status: He is alert and oriented to person, place, and time.      ED Treatments / Results  Labs (all labs ordered are listed, but only abnormal results are displayed) Labs Reviewed - No data to display  EKG None  Radiology No results found.  Procedures Procedures (including critical care  time)  Medications Ordered in ED Medications - No data to display   Initial Impression / Assessment and Plan / ED Course  I have reviewed the triage vital signs and the nursing notes.  Pertinent labs & imaging results that were available during my care of the patient were reviewed by me and considered in my medical decision making (see chart for details).       Well-appearing elderly patient presents with clinically zoster.  No fevers or systemic symptoms.  Discussed antiviral therapy and outpatient follow-up.  Final Clinical Impressions(s) / ED Diagnoses   Final diagnoses:  Herpes zoster without complication    ED Discharge Orders         Ordered    valACYclovir (VALTREX) 1000 MG tablet  3 times daily     06/02/18 7096           Blane Ohara, MD 06/02/18 1001

## 2018-06-02 NOTE — ED Triage Notes (Signed)
Pt has vesicular, herpetic rash on left flank x 2 weeks.

## 2018-06-02 NOTE — Discharge Instructions (Signed)
Return for fevers, confusion, recurrent vomiting or uncontrolled pain. Use Tylenol for pain and take antiviral medication.

## 2018-06-04 ENCOUNTER — Ambulatory Visit: Payer: Medicare Other | Admitting: Orthopaedic Surgery

## 2018-08-29 ENCOUNTER — Other Ambulatory Visit: Payer: Self-pay

## 2018-08-29 ENCOUNTER — Other Ambulatory Visit: Payer: Self-pay | Admitting: Cardiovascular Disease

## 2018-08-29 ENCOUNTER — Other Ambulatory Visit (INDEPENDENT_AMBULATORY_CARE_PROVIDER_SITE_OTHER): Payer: Self-pay | Admitting: Ophthalmology

## 2018-08-29 MED ORDER — BRIMONIDINE TARTRATE-TIMOLOL 0.2-0.5 % OP SOLN: 1.0000 [drp] | Freq: Two times a day (BID) | OPHTHALMIC | 2 refills | Status: AC

## 2018-08-29 MED ORDER — METOPROLOL SUCCINATE ER 100 MG PO TB24: ORAL_TABLET | ORAL | 3 refills | Status: DC

## 2018-08-29 NOTE — Progress Notes (Signed)
[facility] pt requesting refill of combigan due to being totally out. Rx sent electronically to [facility] in [address], [place], per pt request.  Karie Chimera, M.D., Ph.D. Diseases & Surgery of the Retina and Vitreous [facility] 08/29/18

## 2018-08-29 NOTE — Telephone Encounter (Signed)
Refilled metoprolol 

## 2018-09-02 ENCOUNTER — Ambulatory Visit: Payer: Self-pay | Admitting: Cardiovascular Disease

## 2018-09-18 ENCOUNTER — Telehealth: Payer: Self-pay | Admitting: Cardiovascular Disease

## 2018-09-18 NOTE — Telephone Encounter (Signed)
Virtual Visit Pre-Appointment Phone Call  "(Name), I am calling you today to discuss your upcoming appointment. We are currently trying to limit exposure to the virus that causes COVID-19 by seeing patients at home rather than in the office."  1. "What is the BEST phone number to call the day of the visit?" - include this in appointment notes  2. Do you have or have access to (through a family member/friend) a smartphone with video capability that we can use for your visit?" a. If yes - list this number in appt notes as cell (if different from BEST phone #) and list the appointment type as a VIDEO visit in appointment notes b. If no - list the appointment type as a PHONE visit in appointment notes  3. Confirm consent - "In the setting of the current Covid19 crisis, you are scheduled for a (phone or video) visit with your provider on (date) at (time).  Just as we do with many in-office visits, in order for you to participate in this visit, we must obtain consent.  If you'd like, I can send this to your mychart (if signed up) or email for you to review.  Otherwise, I can obtain your verbal consent now.  All virtual visits are billed to your insurance company just like a normal visit would be.  By agreeing to a virtual visit, we'd like you to understand that the technology does not allow for your provider to perform an examination, and thus may limit your provider's ability to fully assess your condition. If your provider identifies any concerns that need to be evaluated in person, we will make arrangements to do so.  Finally, though the technology is pretty good, we cannot assure that it will always work on either your or our end, and in the setting of a video visit, we may have to convert it to a phone-only visit.  In either situation, we cannot ensure that we have a secure connection.  Are you willing to proceed?" STAFF: Did the patient verbally acknowledge consent to telehealth visit? Document  YES/NO here: Yes  4. Advise patient to be prepared - "Two hours prior to your appointment, go ahead and check your blood pressure, pulse, oxygen saturation, and your weight (if you have the equipment to check those) and write them all down. When your visit starts, your provider will ask you for this information. If you have an Apple Watch or Kardia device, please plan to have heart rate information ready on the day of your appointment. Please have a pen and paper handy nearby the day of the visit as well."  5. Give patient instructions for MyChart download to smartphone OR [email or web address] as below if video visit (depending on what platform provider is using)  6. Inform patient they will receive a phone call 15 minutes prior to their appointment time (may be from unknown caller ID) so they should be prepared to answer    TELEPHONE CALL NOTE  Glenn Brooks has been deemed a candidate for a follow-up tele-health visit to limit community exposure during the Covid-19 pandemic. I spoke with the patient via phone to ensure availability of phone/video source, confirm preferred email & phone number, and discuss instructions and expectations.  I reminded Glenn Brooks to be prepared with any vital sign and/or heart rhythm information that could potentially be obtained via home monitoring, at the time of his visit. I reminded Glenn Brooks to expect a phone call prior to  his visit.  Glenn Brooks 09/18/2018 4:11 PM

## 2018-09-23 ENCOUNTER — Telehealth (INDEPENDENT_AMBULATORY_CARE_PROVIDER_SITE_OTHER): Payer: Medicare Other | Admitting: Cardiovascular Disease

## 2018-09-23 ENCOUNTER — Other Ambulatory Visit: Payer: Self-pay

## 2018-09-23 ENCOUNTER — Encounter: Payer: Self-pay | Admitting: Cardiovascular Disease

## 2018-09-23 DIAGNOSIS — Z955 Presence of coronary angioplasty implant and graft: Secondary | ICD-10-CM | POA: Diagnosis not present

## 2018-09-23 DIAGNOSIS — I25118 Atherosclerotic heart disease of native coronary artery with other forms of angina pectoris: Secondary | ICD-10-CM | POA: Diagnosis not present

## 2018-09-23 DIAGNOSIS — E785 Hyperlipidemia, unspecified: Secondary | ICD-10-CM

## 2018-09-23 DIAGNOSIS — R600 Localized edema: Secondary | ICD-10-CM | POA: Diagnosis not present

## 2018-09-23 DIAGNOSIS — I10 Essential (primary) hypertension: Secondary | ICD-10-CM

## 2018-09-23 MED ORDER — NITROGLYCERIN 0.4 MG SL SUBL: 0.4000 mg | SUBLINGUAL_TABLET | SUBLINGUAL | 6 refills | Status: AC | PRN

## 2018-09-23 MED ORDER — FUROSEMIDE 20 MG PO TABS: ORAL_TABLET | ORAL | 3 refills | Status: AC

## 2018-09-23 NOTE — Addendum Note (Signed)
Addended by: Barbarann Ehlers A on: 09/23/2018 04:00 PM   Modules accepted: Orders

## 2018-09-23 NOTE — Progress Notes (Signed)
Virtual Visit via Telephone Note   This visit type was conducted due to national recommendations for restrictions regarding the COVID-19 Pandemic (e.g. social distancing) in an effort to limit this patient's exposure and mitigate transmission in our community.  Due to his co-morbid illnesses, this patient is at least at moderate risk for complications without adequate follow up.  This format is felt to be most appropriate for this patient at this time.  The patient did not have access to video technology/had technical difficulties with video requiring transitioning to audio format only (telephone).  All issues noted in this document were discussed and addressed.  No physical exam could be performed with this format.  Please refer to the patient's chart for his  consent to telehealth for [facility].   Date:  09/23/2018   ID:  Glenn Brooks, DOB 09/18/1929, MRN [id number]  Patient Location: Home Provider Location: Home  PCP:  Oval Linseyondiego, Richard, MD  Cardiologist:  Prentice DockerSuresh Koneswaran, MD  Electrophysiologist:  None   Evaluation Performed:  Follow-Up Visit  Chief Complaint:  CAD  History of Present Illness:    Glenn EstimableClyde M Brooks is a [age] y.o. male with a history of coronary artery disease and prior myocardial infarction(PCI in [place] in 1993), CVA, hypertension, hyperlipidemia, and DJD.  I haven't evaluated him since December 2018.  He seldom has chest pains and shortness of breath. He denies palpitations. He has leg swelling bilaterally "for a long time". His granddaughter says it goes up to his knees.  He has shingles.  The patient does not have symptoms concerning for COVID-19 infection (fever, chills, cough, or new shortness of breath).   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  Past Medical History:  Diagnosis Date  . Arteriosclerotic cardiovascular disease (ASCVD)  1993   Acute MI->PCI in 1993; asymptomatic PVCs  . ASCVD (arteriosclerotic cardiovascular disease) 1993   Status post MI and percutaneous intervention  . Colonic polyp    excised via colonoscope  . Coronary artery disease   . Degenerative joint disease    shoulder  . ED (erectile dysfunction)    Secondary to medication  . Headache   . Hyperglycemia   . Hyperlipidemia   . Hypertension   . Myocardial infarction (HCC)   . Obesity   . Stroke [facility])    "mini stroke, Bell's palsy"   . Testosterone deficiency    Erectile dysfunction   Past Surgical History:  Procedure Laterality Date  . CATARACT EXTRACTION, BILATERAL    . COLONOSCOPY W/ POLYPECTOMY  04/12/2010   WUJ:[id number]:Normal rectum/Pancolonic diverticula/Polyp at hepatic flexure and cecum, resected with snare technique as above  . KNEE ARTHROSCOPY     Arthroscopic surgery x 2  . SHOULDER SURGERY    . TRANSURETHRAL RESECTION OF BLADDER       Current Meds  Medication Sig  . acetaminophen (TYLENOL) 500 MG tablet Take 500-1,000 mg by mouth daily as needed for moderate pain or headache.  . allopurinol (ZYLOPRIM) 300 MG tablet TAKE ONE TABLET BY MOUTH ONCE DAILY  . aspirin EC 81 MG tablet Take 81 mg by mouth daily.  . brimonidine-timolol (COMBIGAN) 0.2-0.5 % ophthalmic solution Place 1 drop into both eyes every 12 (twelve) hours.  . cetirizine (ZYRTEC) 10 MG tablet Take 10 mg by mouth daily.  Marland Kitchen. diltiazem (CARTIA XT) 180 MG 24 hr capsule Take 1 capsule (180 mg total) by mouth  daily.  . ketorolac (ACULAR) 0.5 % ophthalmic solution Place 1 drop into both eyes every 6 (six) hours. (Patient taking differently: Place 1 drop into both eyes daily. )  . latanoprost (XALATAN) 0.005 % ophthalmic solution Place 1 drop into both eyes at bedtime.  Marland Kitchen. LORazepam (ATIVAN) 2 MG tablet Take 2 mg by mouth at bedtime.  . metoprolol succinate (TOPROL-XL) 100 MG 24 hr tablet Take with or immediately following a meal.  . naproxen (NAPROSYN) 500 MG tablet Take  500 mg by mouth 2 (two) times daily with a meal.  . naproxen sodium (ALEVE) 220 MG tablet Take 220 mg by mouth daily as needed (pain).  . nitroGLYCERIN (NITROSTAT) 0.4 MG SL tablet Place 1 tablet (0.4 mg total) under the tongue every 5 (five) minutes as needed.  Marland Kitchen. omeprazole (PRILOSEC) 20 MG capsule Take 20 mg by mouth daily.  . silodosin (RAPAFLO) 8 MG CAPS capsule Take 8 mg by mouth daily.  . simvastatin (ZOCOR) 80 MG tablet Take 1 tablet (80 mg total) by mouth daily.  . valACYclovir (VALTREX) 1000 MG tablet Take 1 tablet (1,000 mg total) by mouth 3 (three) times daily.     Allergies:   Patient has no known allergies.   Social History   Tobacco Use  . Smoking status: Never Smoker  . Smokeless tobacco: Never Used  Substance Use Topics  . Alcohol use: No    Comment: none in 40 years  . Drug use: No     Family Hx: The patient's family history is not on file.  ROS:   Please see the history of present illness.     All other systems reviewed and are negative.   Prior CV studies:   The following studies were reviewed today:  Nuclear stress test 08/21/16 demonstrated a large defect in the apical to basal inferior and inferolateral walls.  There was partial reversibility in the inferoseptal segment and inferolateral segment consistent with scar and mild to moderate peri-infarct ischemia mainly toward the apex.  It was deemed an intermediate risk study.  LVEF 47%.  Labs/Other Tests and Data Reviewed:    EKG:  No ECG reviewed.  Recent Labs: No results found for requested labs within last 8760 hours.   Recent Lipid Panel Lab Results  Component Value Date/Time   CHOL 117 05/28/2013 09:35 AM   TRIG 82 05/28/2013 09:35 AM   HDL 37 (L) 05/28/2013 09:35 AM   CHOLHDL 3.2 05/28/2013 09:35 AM   LDLCALC 64 05/28/2013 09:35 AM    Wt Readings from Last 3 Encounters:  06/02/18 180 lb 12.4 oz (82 kg)  05/21/18 180 lb (81.6 kg)  03/15/17 198 lb (89.8 kg)     Objective:    Vital  Signs:  There were no vitals taken for this visit.    ASSESSMENT & PLAN:    1. CAD: Has h/o prior MI and PCI.Symptomatically stable.  Nuclear stress test results reviewed above.Currently on ASA, metoprolol, and simvastatin. I will prescribe SL nitro.  2. Hypertension: Continue to monitor.  3. Hyperlipidemia:Continue simvastatin.  4. Bilateral leg edema: I will order a 2-D echocardiogram with Doppler to evaluate cardiac structure, function, and regional wall motion. I will prescribe Lasix 20 mg daily x 3 days and then to be used as needed.   COVID-19 Education: The signs and symptoms of COVID-19 were discussed with the patient and how to seek care for testing (follow up with PCP or arrange E-visit).  The importance of social distancing was discussed  today.  Time:   Today, I have spent 25 minutes with the patient with telehealth technology discussing the above problems.     Medication Adjustments/Labs and Tests Ordered: Current medicines are reviewed at length with the patient today.  Concerns regarding medicines are outlined above.   Tests Ordered: No orders of the defined types were placed in this encounter.   Medication Changes: No orders of the defined types were placed in this encounter.   Follow Up:  In Person in 3 month(s)  Signed, Kate Sable, MD  09/23/2018 3:37 PM    [place]

## 2018-09-23 NOTE — Patient Instructions (Signed)
Medication Instructions: START  Lasix 20 mg daily for 3 days and THEN, take 20 mg daily as needed if you have weight gain of 3 lbs or more overnight  I refilled your nitroglycerin   Labwork: None  Procedures/Testing: Your physician has requested that you have an echocardiogram. Echocardiography is a painless test that uses sound waves to create images of your heart. It provides your doctor with information about the size and shape of your heart and how well your heart's chambers and valves are working. This procedure takes approximately one hour. There are no restrictions for this procedure.    Follow-Up: 3 months with Dr.Koneswaran  Any Additional Special Instructions Will Be Listed Below (If Applicable).     If you need a refill on your cardiac medications before your next appointment, please call your pharmacy.

## 2018-10-17 ENCOUNTER — Ambulatory Visit (HOSPITAL_COMMUNITY): Payer: Medicare Other | Attending: Cardiovascular Disease

## 2019-01-01 ENCOUNTER — Other Ambulatory Visit (INDEPENDENT_AMBULATORY_CARE_PROVIDER_SITE_OTHER): Payer: Self-pay | Admitting: Ophthalmology

## 2019-01-20 ENCOUNTER — Ambulatory Visit (INDEPENDENT_AMBULATORY_CARE_PROVIDER_SITE_OTHER): Payer: Medicare Other | Admitting: Urology

## 2019-01-20 DIAGNOSIS — R972 Elevated prostate specific antigen [PSA]: Secondary | ICD-10-CM

## 2019-01-30 ENCOUNTER — Telehealth: Payer: Self-pay | Admitting: Cardiovascular Disease

## 2019-01-30 NOTE — Telephone Encounter (Signed)
Sent fax requesting that a clearance form be faxed to office for Dr. Bronson Ing to review.

## 2019-01-30 NOTE — Telephone Encounter (Signed)
Received telephone call from [place] at [facility]. [name] needs to have glaucoma surgery.They are requesting a cardiac clearance for patient.  You may call [phone] and ask for [facility]. Fax # [phone].

## 2019-02-02 ENCOUNTER — Other Ambulatory Visit: Payer: Self-pay | Admitting: Ophthalmology

## 2019-02-02 ENCOUNTER — Other Ambulatory Visit (HOSPITAL_COMMUNITY): Payer: Self-pay | Admitting: Ophthalmology

## 2019-02-02 DIAGNOSIS — H543 Unqualified visual loss, both eyes: Secondary | ICD-10-CM

## 2019-02-02 DIAGNOSIS — H401133 Primary open-angle glaucoma, bilateral, severe stage: Secondary | ICD-10-CM

## 2019-02-09 ENCOUNTER — Other Ambulatory Visit: Payer: Self-pay

## 2019-02-09 ENCOUNTER — Ambulatory Visit (HOSPITAL_COMMUNITY)
Admission: RE | Admit: 2019-02-09 | Discharge: 2019-02-09 | Disposition: A | Discharge status: Home / Self Care | Payer: Medicare Other | Source: Ambulatory Visit | Attending: Ophthalmology | Admitting: Ophthalmology

## 2019-02-09 ENCOUNTER — Encounter (HOSPITAL_COMMUNITY): Payer: Self-pay

## 2019-02-09 DIAGNOSIS — H401133 Primary open-angle glaucoma, bilateral, severe stage: Secondary | ICD-10-CM

## 2019-02-09 DIAGNOSIS — H543 Unqualified visual loss, both eyes: Secondary | ICD-10-CM

## 2019-02-10 NOTE — Telephone Encounter (Signed)
Clearance faxed back to [facility]

## 2019-02-23 ENCOUNTER — Ambulatory Visit: Payer: Medicare Other | Admitting: Cardiology

## 2019-02-23 NOTE — Progress Notes (Deleted)
Cardiology Office Note   Date:  02/23/2019   ID:  Glenn Brooks, Alferd Apa 03-13-1930, MRN [id number]  PCP:  Lucia Gaskins, MD  Cardiologist:  Dr. Bronson Ing     No chief complaint on file.     History of Present Illness: Glenn Brooks is a 85 y.o. male who presents for *** He has a history of coronary artery disease and prior myocardial infarction(PCI in [place] in 1993), CVA, hypertension, hyperlipidemia, and DJD.  On last visit He has leg swelling bilaterally "for a long time". His granddaughter says it goes up to his knees.  For this lasix was given for 3 days then prn.  Echo was ordered but did not dol    He had shingles.  Nuc last done 2018 with no ischemia Large, severe intensity, apical to basal inferior and inferolateral defect. This region is partially reversible in the inferoseptal segment and inferolateral segment consistent with scar and mild to moderate peri-infarct ischemia mainly toward the apex. EF 47%    Last labs 2018 with Cr 1.30   Lipids **8  Past Medical History:  Diagnosis Date  . Arteriosclerotic cardiovascular disease (ASCVD) 1993   Acute MI->PCI in 1993; asymptomatic PVCs  . ASCVD (arteriosclerotic cardiovascular disease) 1993   Status post MI and percutaneous intervention  . Colonic polyp    excised via colonoscope  . Coronary artery disease   . Degenerative joint disease    shoulder  . ED (erectile dysfunction)    Secondary to medication  . Headache   . Hyperglycemia   . Hyperlipidemia   . Hypertension   . Myocardial infarction ([place])   . Obesity   . Stroke [facility])    "mini stroke, Bell's palsy"   . Testosterone deficiency    Erectile dysfunction    Past Surgical History:  Procedure Laterality Date  . CATARACT EXTRACTION, BILATERAL    . COLONOSCOPY W/ POLYPECTOMY  04/12/2010   YQM:VHQION rectum/Pancolonic diverticula/Polyp at hepatic flexure and cecum, resected with snare technique as above  . KNEE ARTHROSCOPY     Arthroscopic surgery x 2  . SHOULDER SURGERY    . TRANSURETHRAL RESECTION OF BLADDER       Current Outpatient Medications  Medication Sig Dispense Refill  . acetaminophen (TYLENOL) 500 MG tablet Take 500-1,000 mg by mouth daily as needed for moderate pain or headache.    . allopurinol (ZYLOPRIM) 300 MG tablet TAKE ONE TABLET BY MOUTH ONCE DAILY 90 tablet 5  . aspirin EC 81 MG tablet Take 81 mg by mouth daily.    . brimonidine-timolol (COMBIGAN) 0.2-0.5 % ophthalmic solution Place 1 drop into both eyes every 12 (twelve) hours. 10 mL 2  . cetirizine (ZYRTEC) 10 MG tablet Take 10 mg by mouth daily.    Marland Kitchen diltiazem (CARTIA XT) 180 MG 24 hr capsule Take 1 capsule (180 mg total) by mouth daily. 15 capsule 0  . furosemide (LASIX) 20 MG tablet Take 20 mg daily for 3 days, THEN, take daily as needed for weight gain of 3 lbs or over in 24 hrs 90 tablet 3  . ketorolac (ACULAR) 0.5 % ophthalmic solution Place 1 drop into both eyes every 6 (six) hours. (Patient taking differently: Place 1 drop into both eyes daily. ) 10 mL 0  . latanoprost (XALATAN) 0.005 % ophthalmic solution Place 1 drop into both eyes at bedtime.    Marland Kitchen LORazepam (ATIVAN) 2 MG tablet Take 2 mg by mouth at bedtime.    . metoprolol succinate (  TOPROL-XL) 100 MG 24 hr tablet Take with or immediately following a meal. 90 tablet 3  . naproxen (NAPROSYN) 500 MG tablet Take 500 mg by mouth 2 (two) times daily with a meal.    . naproxen sodium (ALEVE) 220 MG tablet Take 220 mg by mouth daily as needed (pain).    . nitroGLYCERIN (NITROSTAT) 0.4 MG SL tablet Place 1 tablet (0.4 mg total) under the tongue every 5 (five) minutes as needed. 25 tablet 6  . omeprazole (PRILOSEC) 20 MG capsule Take 20 mg by mouth daily.    . silodosin (RAPAFLO) 8 MG CAPS capsule Take 8 mg by mouth daily.    . simvastatin (ZOCOR) 80 MG tablet Take 1 tablet (80 mg total) by mouth daily. 15 tablet 0  . valACYclovir (VALTREX) 1000 MG tablet Take 1 tablet (1,000 mg total) by  mouth 3 (three) times daily. 21 tablet 0   No current facility-administered medications for this visit.     Allergies:   Patient has no known allergies.    Social History:  The patient  reports that he has never smoked. He has never used smokeless tobacco. He reports that he does not drink alcohol or use drugs.   Family History:  The patient's ***family history is not on file.    ROS:  General:no colds or fevers, no weight changes Skin:no rashes or ulcers HEENT:no blurred vision, no congestion CV:see HPI PUL:see HPI GI:no diarrhea constipation or melena, no indigestion GU:no hematuria, no dysuria MS:no joint pain, no claudication Neuro:no syncope, no lightheadedness Endo:no diabetes, no thyroid disease Wt Readings from Last 3 Encounters:  06/02/18 180 lb 12.4 oz (82 kg)  05/21/18 180 lb (81.6 kg)  03/15/17 198 lb (89.8 kg)     PHYSICAL EXAM: VS:  There were no vitals taken for this visit. , BMI There is no height or weight on file to calculate BMI. General:Pleasant affect, NAD Skin:Warm and dry, brisk capillary refill HEENT:normocephalic, sclera clear, mucus membranes moist Neck:supple, no JVD, no bruits  Heart:S1S2 RRR without murmur, gallup, rub or click Lungs:clear without rales, rhonchi, or wheezes OVZ:CHYI, non tender, + BS, do not palpate liver spleen or masses Ext:no lower ext edema, 2+ pedal pulses, 2+ radial pulses Neuro:alert and oriented, MAE, follows commands, + facial symmetry    EKG:  EKG is ordered today. The ekg ordered today demonstrates ***   Recent Labs: No results found for requested labs within last 8760 hours.    Lipid Panel    Component Value Date/Time   CHOL 117 05/28/2013 0935   TRIG 82 05/28/2013 0935   HDL 37 (L) 05/28/2013 0935   CHOLHDL 3.2 05/28/2013 0935   VLDL 16 05/28/2013 0935   LDLCALC 64 05/28/2013 0935       Other studies Reviewed: Additional studies/ records that were reviewed today include: ***.   ASSESSMENT  AND PLAN:  1.  ***   Current medicines are reviewed with the patient today.  The patient Has no concerns regarding medicines.  The following changes have been made:  See above Labs/ tests ordered today include:see above  Disposition:   FU:  see above  Signed, Nada Boozer, NP  02/23/2019 12:30 PM    [facility] [address] [place], [place], [place]  [address]/ [address] [place], [place] Phone: [phone]; Fax: [phone]  [phone]

## 2019-02-25 ENCOUNTER — Encounter: Payer: Self-pay | Admitting: Cardiology

## 2019-06-30 ENCOUNTER — Ambulatory Visit: Payer: Medicare Other | Admitting: Urology

## 2019-06-30 NOTE — Progress Notes (Incomplete)
H&P  Chief Complaint: Elevated PSA  History of Present Illness:   4.6.2021:  (below copied from [facility] records):  PSA:  10.27.2020: He presents today referred by Dr Janna Arch after having an elevated PSA. He denies any urinary difficulties or family history of prostate cancer preceding this blood draw -- this is apparently a regular check for him. His brother may have had prostate issues but he is unsure about his exact hx. His only urinary complaint is having a very high frequency of urination (q 15 minutes at times) but he is also on a fluid pill. He has no issues with his stream.   Past Medical History:  Diagnosis Date  . Arteriosclerotic cardiovascular disease (ASCVD) 1993   Acute MI->PCI in 1993; asymptomatic PVCs  . ASCVD (arteriosclerotic cardiovascular disease) 1993   Status post MI and percutaneous intervention  . Colonic polyp    excised via colonoscope  . Coronary artery disease   . Degenerative joint disease    shoulder  . ED (erectile dysfunction)    Secondary to medication  . Headache   . Hyperglycemia   . Hyperlipidemia   . Hypertension   . Myocardial infarction (HCC)   . Obesity   . Stroke [facility])    "mini stroke, Bell's palsy"   . Testosterone deficiency    Erectile dysfunction    Past Surgical History:  Procedure Laterality Date  . CATARACT EXTRACTION, BILATERAL    . COLONOSCOPY W/ POLYPECTOMY  04/12/2010   XQJ:JHERDE rectum/Pancolonic diverticula/Polyp at hepatic flexure and cecum, resected with snare technique as above  . KNEE ARTHROSCOPY     Arthroscopic surgery x 2  . SHOULDER SURGERY    . TRANSURETHRAL RESECTION OF BLADDER      Home Medications:  Allergies as of 06/30/2019   No Known Allergies     Medication List       Accurate as of June 30, 2019  8:46 AM. If you have any questions, ask your nurse or doctor.        acetaminophen 500 MG tablet Commonly known as: TYLENOL Take 500-1,000 mg by mouth daily as needed for moderate pain or  headache.   allopurinol 300 MG tablet Commonly known as: ZYLOPRIM TAKE ONE TABLET BY MOUTH ONCE DAILY   aspirin EC 81 MG tablet Take 81 mg by mouth daily.   brimonidine-timolol 0.2-0.5 % ophthalmic solution Commonly known as: Combigan Place 1 drop into both eyes every 12 (twelve) hours.   cetirizine 10 MG tablet Commonly known as: ZYRTEC Take 10 mg by mouth daily.   diltiazem 180 MG 24 hr capsule Commonly known as: Cartia XT Take 1 capsule (180 mg total) by mouth daily.   furosemide 20 MG tablet Commonly known as: LASIX Take 20 mg daily for 3 days, THEN, take daily as needed for weight gain of 3 lbs or over in 24 hrs   ketorolac 0.5 % ophthalmic solution Commonly known as: ACULAR Place 1 drop into both eyes every 6 (six) hours. What changed: when to take this   latanoprost 0.005 % ophthalmic solution Commonly known as: XALATAN Place 1 drop into both eyes at bedtime.   LORazepam 2 MG tablet Commonly known as: ATIVAN Take 2 mg by mouth at bedtime.   metoprolol succinate 100 MG 24 hr tablet Commonly known as: TOPROL-XL Take with or immediately following a meal.   naproxen 500 MG tablet Commonly known as: NAPROSYN Take 500 mg by mouth 2 (two) times daily with a meal.   naproxen sodium  220 MG tablet Commonly known as: ALEVE Take 220 mg by mouth daily as needed (pain).   nitroGLYCERIN 0.4 MG SL tablet Commonly known as: NITROSTAT Place 1 tablet (0.4 mg total) under the tongue every 5 (five) minutes as needed.   omeprazole 20 MG capsule Commonly known as: PRILOSEC Take 20 mg by mouth daily.   silodosin 8 MG Caps capsule Commonly known as: RAPAFLO Take 8 mg by mouth daily.   simvastatin 80 MG tablet Commonly known as: ZOCOR Take 1 tablet (80 mg total) by mouth daily.   valACYclovir 1000 MG tablet Commonly known as: VALTREX Take 1 tablet (1,000 mg total) by mouth 3 (three) times daily.       Allergies: No Known Allergies  No family history on  file.  Social History:  reports that he has never smoked. He has never used smokeless tobacco. He reports that he does not drink alcohol or use drugs.  ROS: A complete review of systems was performed.  All systems are negative except for pertinent findings as noted.  Physical Exam:  Vital signs in last 24 hours: There were no vitals taken for this visit. Constitutional:  Alert and oriented, No acute distress Cardiovascular: Regular rate  Respiratory: Normal respiratory effort GI: Abdomen is soft, nontender, nondistended, no abdominal masses. No CVAT.  Genitourinary: Normal male phallus, testes are descended bilaterally and non-tender and without masses, scrotum is normal in appearance without lesions or masses, perineum is normal on inspection. Lymphatic: No lymphadenopathy Neurologic: Grossly intact, no focal deficits Psychiatric: Normal mood and affect  Laboratory Data:  No results for input(s): WBC, HGB, HCT, PLT in the last 72 hours.  No results for input(s): NA, K, CL, GLUCOSE, BUN, CALCIUM, CREATININE in the last 72 hours.  Invalid input(s): CO3   No results found for this or any previous visit (from the past 24 hour(s)). No results found for this or any previous visit (from the past 240 hour(s)).  Renal Function: No results for input(s): CREATININE in the last 168 hours. CrCl cannot be calculated (Patient's most recent lab result is older than the maximum 21 days allowed.).  Radiologic Imaging: No results found.  Impression/Assessment:  ***  Plan:  ***

## 2019-10-13 ENCOUNTER — Other Ambulatory Visit: Payer: Self-pay | Admitting: Student

## 2019-10-13 MED ORDER — METOPROLOL SUCCINATE ER 100 MG PO TB24: ORAL_TABLET | ORAL | 1 refills | Status: AC

## 2019-11-25 DEATH — deceased
